# Patient Record
Sex: Male | Born: 1955 | Race: White | Hispanic: No | Marital: Married | Smoking: Current every day smoker
Health system: Southern US, Community
[De-identification: ages and names within clinical notes are randomized; demographics above are authoritative.]

## PROBLEM LIST (undated history)

## (undated) DIAGNOSIS — I509 Heart failure, unspecified: Secondary | ICD-10-CM

## (undated) DIAGNOSIS — Z972 Presence of dental prosthetic device (complete) (partial): Secondary | ICD-10-CM

## (undated) DIAGNOSIS — Z974 Presence of external hearing-aid: Secondary | ICD-10-CM

## (undated) DIAGNOSIS — I10 Essential (primary) hypertension: Secondary | ICD-10-CM

## (undated) DIAGNOSIS — D689 Coagulation defect, unspecified: Secondary | ICD-10-CM

## (undated) DIAGNOSIS — I219 Acute myocardial infarction, unspecified: Secondary | ICD-10-CM

## (undated) DIAGNOSIS — H269 Unspecified cataract: Secondary | ICD-10-CM

## (undated) DIAGNOSIS — G629 Polyneuropathy, unspecified: Secondary | ICD-10-CM

## (undated) DIAGNOSIS — S83209A Unspecified tear of unspecified meniscus, current injury, unspecified knee, initial encounter: Secondary | ICD-10-CM

## (undated) DIAGNOSIS — G4730 Sleep apnea, unspecified: Secondary | ICD-10-CM

## (undated) DIAGNOSIS — J449 Chronic obstructive pulmonary disease, unspecified: Secondary | ICD-10-CM

## (undated) DIAGNOSIS — J439 Emphysema, unspecified: Secondary | ICD-10-CM

## (undated) DIAGNOSIS — E119 Type 2 diabetes mellitus without complications: Secondary | ICD-10-CM

## (undated) DIAGNOSIS — I2699 Other pulmonary embolism without acute cor pulmonale: Secondary | ICD-10-CM

## (undated) DIAGNOSIS — E785 Hyperlipidemia, unspecified: Secondary | ICD-10-CM

## (undated) HISTORY — DX: Heart failure, unspecified: I50.9

## (undated) HISTORY — DX: Emphysema, unspecified: J43.9

## (undated) HISTORY — DX: Chronic obstructive pulmonary disease, unspecified: J44.9

## (undated) HISTORY — DX: Essential (primary) hypertension: I10

## (undated) HISTORY — DX: Coagulation defect, unspecified: D68.9

## (undated) HISTORY — PX: OTHER SURGICAL HISTORY: SHX169

## (undated) HISTORY — DX: Hyperlipidemia, unspecified: E78.5

## (undated) HISTORY — PX: SMALL INTESTINE SURGERY: SHX150

## (undated) HISTORY — PX: CARDIAC CATHETERIZATION: SHX172

## (undated) HISTORY — DX: Unspecified cataract: H26.9

## (undated) HISTORY — PX: COLON SURGERY: SHX602

## (undated) HISTORY — DX: Other pulmonary embolism without acute cor pulmonale: I26.99

---

## 2011-04-02 ENCOUNTER — Ambulatory Visit: Payer: Self-pay | Admitting: Anesthesiology

## 2011-04-08 ENCOUNTER — Ambulatory Visit: Payer: Self-pay | Admitting: Unknown Physician Specialty

## 2012-06-16 ENCOUNTER — Emergency Department: Payer: Self-pay | Admitting: Emergency Medicine

## 2013-03-15 DIAGNOSIS — I219 Acute myocardial infarction, unspecified: Secondary | ICD-10-CM

## 2013-03-15 HISTORY — DX: Acute myocardial infarction, unspecified: I21.9

## 2013-03-15 HISTORY — PX: CORONARY ANGIOPLASTY WITH STENT PLACEMENT: SHX49

## 2013-03-15 HISTORY — PX: CARDIAC CATHETERIZATION: SHX172

## 2013-08-29 DIAGNOSIS — R5383 Other fatigue: Secondary | ICD-10-CM | POA: Insufficient documentation

## 2013-08-29 DIAGNOSIS — I219 Acute myocardial infarction, unspecified: Secondary | ICD-10-CM | POA: Insufficient documentation

## 2016-11-26 LAB — PULMONARY FUNCTION TEST

## 2018-03-15 DIAGNOSIS — I2699 Other pulmonary embolism without acute cor pulmonale: Secondary | ICD-10-CM

## 2018-03-15 HISTORY — DX: Other pulmonary embolism without acute cor pulmonale: I26.99

## 2018-08-31 ENCOUNTER — Other Ambulatory Visit: Payer: Self-pay

## 2018-08-31 DIAGNOSIS — I252 Old myocardial infarction: Secondary | ICD-10-CM

## 2018-08-31 DIAGNOSIS — I10 Essential (primary) hypertension: Secondary | ICD-10-CM | POA: Diagnosis not present

## 2018-08-31 DIAGNOSIS — I2789 Other specified pulmonary heart diseases: Secondary | ICD-10-CM | POA: Diagnosis not present

## 2018-08-31 DIAGNOSIS — Z79899 Other long term (current) drug therapy: Secondary | ICD-10-CM

## 2018-08-31 DIAGNOSIS — J439 Emphysema, unspecified: Secondary | ICD-10-CM | POA: Diagnosis not present

## 2018-08-31 DIAGNOSIS — J9811 Atelectasis: Secondary | ICD-10-CM | POA: Diagnosis not present

## 2018-08-31 DIAGNOSIS — E785 Hyperlipidemia, unspecified: Secondary | ICD-10-CM | POA: Diagnosis present

## 2018-08-31 DIAGNOSIS — R0902 Hypoxemia: Secondary | ICD-10-CM | POA: Diagnosis not present

## 2018-08-31 DIAGNOSIS — R0682 Tachypnea, not elsewhere classified: Secondary | ICD-10-CM | POA: Diagnosis present

## 2018-08-31 DIAGNOSIS — Z1159 Encounter for screening for other viral diseases: Secondary | ICD-10-CM | POA: Diagnosis not present

## 2018-08-31 DIAGNOSIS — K760 Fatty (change of) liver, not elsewhere classified: Secondary | ICD-10-CM | POA: Diagnosis not present

## 2018-08-31 DIAGNOSIS — Z720 Tobacco use: Secondary | ICD-10-CM | POA: Diagnosis not present

## 2018-08-31 DIAGNOSIS — I700 Atherosclerosis of aorta: Secondary | ICD-10-CM | POA: Diagnosis present

## 2018-08-31 DIAGNOSIS — I2584 Coronary atherosclerosis due to calcified coronary lesion: Secondary | ICD-10-CM | POA: Diagnosis present

## 2018-08-31 DIAGNOSIS — I2694 Multiple subsegmental pulmonary emboli without acute cor pulmonale: Secondary | ICD-10-CM | POA: Diagnosis not present

## 2018-08-31 DIAGNOSIS — Z7982 Long term (current) use of aspirin: Secondary | ICD-10-CM | POA: Diagnosis not present

## 2018-08-31 DIAGNOSIS — I2699 Other pulmonary embolism without acute cor pulmonale: Secondary | ICD-10-CM | POA: Diagnosis not present

## 2018-08-31 DIAGNOSIS — R079 Chest pain, unspecified: Secondary | ICD-10-CM | POA: Diagnosis not present

## 2018-08-31 DIAGNOSIS — Z955 Presence of coronary angioplasty implant and graft: Secondary | ICD-10-CM

## 2018-08-31 DIAGNOSIS — I2510 Atherosclerotic heart disease of native coronary artery without angina pectoris: Secondary | ICD-10-CM | POA: Diagnosis not present

## 2018-08-31 DIAGNOSIS — F17210 Nicotine dependence, cigarettes, uncomplicated: Secondary | ICD-10-CM | POA: Diagnosis present

## 2018-08-31 DIAGNOSIS — I82431 Acute embolism and thrombosis of right popliteal vein: Secondary | ICD-10-CM | POA: Diagnosis not present

## 2018-08-31 LAB — CBC WITH DIFFERENTIAL/PLATELET
Abs Immature Granulocytes: 0.05 10*3/uL (ref 0.00–0.07)
Basophils Absolute: 0.1 10*3/uL (ref 0.0–0.1)
Basophils Relative: 1 %
Eosinophils Absolute: 0.3 10*3/uL (ref 0.0–0.5)
Eosinophils Relative: 3 %
HCT: 46.3 % (ref 39.0–52.0)
Hemoglobin: 15.2 g/dL (ref 13.0–17.0)
Immature Granulocytes: 1 %
Lymphocytes Relative: 22 %
Lymphs Abs: 2.2 10*3/uL (ref 0.7–4.0)
MCH: 30.5 pg (ref 26.0–34.0)
MCHC: 32.8 g/dL (ref 30.0–36.0)
MCV: 92.8 fL (ref 80.0–100.0)
Monocytes Absolute: 1 10*3/uL (ref 0.1–1.0)
Monocytes Relative: 10 %
Neutro Abs: 6.4 10*3/uL (ref 1.7–7.7)
Neutrophils Relative %: 63 %
Platelets: 241 10*3/uL (ref 150–400)
RBC: 4.99 MIL/uL (ref 4.22–5.81)
RDW: 12.7 % (ref 11.5–15.5)
WBC: 10 10*3/uL (ref 4.0–10.5)
nRBC: 0 % (ref 0.0–0.2)

## 2018-08-31 LAB — COMPREHENSIVE METABOLIC PANEL
ALT: 25 U/L (ref 0–44)
AST: 18 U/L (ref 15–41)
Albumin: 4.1 g/dL (ref 3.5–5.0)
Alkaline Phosphatase: 76 U/L (ref 38–126)
Anion gap: 8 (ref 5–15)
BUN: 23 mg/dL (ref 8–23)
CO2: 22 mmol/L (ref 22–32)
Calcium: 9 mg/dL (ref 8.9–10.3)
Chloride: 108 mmol/L (ref 98–111)
Creatinine, Ser: 0.98 mg/dL (ref 0.61–1.24)
GFR calc Af Amer: >60 mL/min (ref >60)
GFR calc non Af Amer: >60 mL/min (ref >60)
Glucose, Bld: 119 mg/dL — ABNORMAL HIGH (ref 70–99)
Potassium: 4.2 mmol/L (ref 3.5–5.1)
Sodium: 138 mmol/L (ref 135–145)
Total Bilirubin: 0.4 mg/dL (ref 0.3–1.2)
Total Protein: 7.8 g/dL (ref 6.5–8.1)

## 2018-08-31 LAB — TROPONIN I: Troponin I: <0.03 ng/mL (ref <0.03)

## 2018-08-31 NOTE — ED Triage Notes (Signed)
Patient c/o left chest pain. Patient denies radiation and accompanying symptoms. Patient reports hx of MI 4 years ago with stent placement.

## 2018-09-01 ENCOUNTER — Inpatient Hospital Stay
Admission: EM | Admit: 2018-09-01 | Discharge: 2018-09-02 | DRG: 176 | Disposition: A | Discharge status: Home / Self Care | Payer: Medicare Other | Attending: Internal Medicine | Admitting: Internal Medicine

## 2018-09-01 ENCOUNTER — Emergency Department: Payer: Medicare Other

## 2018-09-01 ENCOUNTER — Inpatient Hospital Stay: Payer: Medicare Other

## 2018-09-01 ENCOUNTER — Inpatient Hospital Stay
Admit: 2018-09-01 | Discharge: 2018-09-01 | Disposition: A | Discharge status: Home / Self Care | Payer: Medicare Other | Attending: Family Medicine | Admitting: Family Medicine

## 2018-09-01 DIAGNOSIS — Z79899 Other long term (current) drug therapy: Secondary | ICD-10-CM | POA: Diagnosis not present

## 2018-09-01 DIAGNOSIS — I2699 Other pulmonary embolism without acute cor pulmonale: Secondary | ICD-10-CM

## 2018-09-01 DIAGNOSIS — R0682 Tachypnea, not elsewhere classified: Secondary | ICD-10-CM | POA: Diagnosis present

## 2018-09-01 DIAGNOSIS — J9811 Atelectasis: Secondary | ICD-10-CM | POA: Diagnosis present

## 2018-09-01 DIAGNOSIS — Z86711 Personal history of pulmonary embolism: Secondary | ICD-10-CM | POA: Diagnosis present

## 2018-09-01 DIAGNOSIS — I700 Atherosclerosis of aorta: Secondary | ICD-10-CM | POA: Diagnosis present

## 2018-09-01 DIAGNOSIS — Z955 Presence of coronary angioplasty implant and graft: Secondary | ICD-10-CM | POA: Diagnosis not present

## 2018-09-01 DIAGNOSIS — K760 Fatty (change of) liver, not elsewhere classified: Secondary | ICD-10-CM | POA: Diagnosis present

## 2018-09-01 DIAGNOSIS — I2510 Atherosclerotic heart disease of native coronary artery without angina pectoris: Secondary | ICD-10-CM | POA: Diagnosis present

## 2018-09-01 DIAGNOSIS — F17210 Nicotine dependence, cigarettes, uncomplicated: Secondary | ICD-10-CM | POA: Diagnosis present

## 2018-09-01 DIAGNOSIS — I2694 Multiple subsegmental pulmonary emboli without acute cor pulmonale: Secondary | ICD-10-CM | POA: Diagnosis present

## 2018-09-01 DIAGNOSIS — Z1159 Encounter for screening for other viral diseases: Secondary | ICD-10-CM | POA: Diagnosis not present

## 2018-09-01 DIAGNOSIS — E785 Hyperlipidemia, unspecified: Secondary | ICD-10-CM | POA: Diagnosis present

## 2018-09-01 DIAGNOSIS — R0902 Hypoxemia: Secondary | ICD-10-CM | POA: Diagnosis present

## 2018-09-01 DIAGNOSIS — I252 Old myocardial infarction: Secondary | ICD-10-CM | POA: Diagnosis not present

## 2018-09-01 DIAGNOSIS — Z7982 Long term (current) use of aspirin: Secondary | ICD-10-CM | POA: Diagnosis not present

## 2018-09-01 DIAGNOSIS — I2584 Coronary atherosclerosis due to calcified coronary lesion: Secondary | ICD-10-CM | POA: Diagnosis present

## 2018-09-01 DIAGNOSIS — J439 Emphysema, unspecified: Secondary | ICD-10-CM | POA: Diagnosis present

## 2018-09-01 DIAGNOSIS — I10 Essential (primary) hypertension: Secondary | ICD-10-CM | POA: Diagnosis present

## 2018-09-01 HISTORY — DX: Acute myocardial infarction, unspecified: I21.9

## 2018-09-01 LAB — SARS CORONAVIRUS 2 BY RT PCR (HOSPITAL ORDER, PERFORMED IN ~~LOC~~ HOSPITAL LAB): SARS Coronavirus 2: NEGATIVE

## 2018-09-01 LAB — FIBRIN DERIVATIVES D-DIMER (ARMC ONLY): Fibrin derivatives D-dimer (ARMC): 1639.69 ng/mL (FEU) — ABNORMAL HIGH (ref 0.00–499.00)

## 2018-09-01 LAB — ECHOCARDIOGRAM COMPLETE

## 2018-09-01 LAB — TROPONIN I: Troponin I: <0.03 ng/mL (ref <0.03)

## 2018-09-01 MED ORDER — NICOTINE 21 MG/24HR TD PT24
21.0000 mg | MEDICATED_PATCH | Freq: Every day | TRANSDERMAL | Status: DC
  Administered 2018-09-01 – 2018-09-02 (×2): 21 mg via TRANSDERMAL
  Filled 2018-09-01 (×2): qty 1

## 2018-09-01 MED ORDER — ACETAMINOPHEN 650 MG RE SUPP: 650.0000 mg | Freq: Four times a day (QID) | RECTAL | Status: DC | PRN

## 2018-09-01 MED ORDER — ONDANSETRON HCL 4 MG/2ML IJ SOLN: 4.0000 mg | Freq: Four times a day (QID) | INTRAMUSCULAR | Status: DC | PRN

## 2018-09-01 MED ORDER — ENOXAPARIN SODIUM 150 MG/ML ~~LOC~~ SOLN
1.0000 mg/kg | Freq: Once | SUBCUTANEOUS | Status: AC
  Administered 2018-09-01: 135 mg via SUBCUTANEOUS
  Filled 2018-09-01: qty 0.91

## 2018-09-01 MED ORDER — WARFARIN SODIUM 5 MG PO TABS
5.0000 mg | ORAL_TABLET | Freq: Once | ORAL | Status: DC
  Filled 2018-09-01: qty 1

## 2018-09-01 MED ORDER — WARFARIN - PHARMACIST DOSING INPATIENT
Freq: Every day | Status: DC
  Filled 2018-09-01: qty 1

## 2018-09-01 MED ORDER — SODIUM CHLORIDE 0.9 % IV SOLN
INTRAVENOUS | Status: DC
  Administered 2018-09-01 – 2018-09-02 (×3): via INTRAVENOUS

## 2018-09-01 MED ORDER — TRAZODONE HCL 50 MG PO TABS: 25.0000 mg | ORAL_TABLET | Freq: Every evening | ORAL | Status: DC | PRN

## 2018-09-01 MED ORDER — ACETAMINOPHEN 325 MG PO TABS: 650.0000 mg | ORAL_TABLET | Freq: Four times a day (QID) | ORAL | Status: DC | PRN

## 2018-09-01 MED ORDER — WARFARIN SODIUM 5 MG PO TABS: 5.0000 mg | ORAL_TABLET | Freq: Once | ORAL | Status: DC

## 2018-09-01 MED ORDER — ONDANSETRON HCL 4 MG PO TABS: 4.0000 mg | ORAL_TABLET | Freq: Four times a day (QID) | ORAL | Status: DC | PRN

## 2018-09-01 MED ORDER — APIXABAN 5 MG PO TABS: 5.0000 mg | ORAL_TABLET | Freq: Two times a day (BID) | ORAL | Status: DC

## 2018-09-01 MED ORDER — IOPAMIDOL (ISOVUE-370) INJECTION 76%
100.0000 mL | Freq: Once | INTRAVENOUS | Status: AC | PRN
  Administered 2018-09-01: 100 mL via INTRAVENOUS

## 2018-09-01 MED ORDER — APIXABAN 5 MG PO TABS
10.0000 mg | ORAL_TABLET | Freq: Two times a day (BID) | ORAL | Status: DC
  Administered 2018-09-01 – 2018-09-02 (×2): 10 mg via ORAL
  Filled 2018-09-01 (×2): qty 2

## 2018-09-01 MED ORDER — ENOXAPARIN SODIUM 150 MG/ML ~~LOC~~ SOLN
1.0000 mg/kg | Freq: Two times a day (BID) | SUBCUTANEOUS | Status: DC
  Filled 2018-09-01: qty 0.91

## 2018-09-01 MED ORDER — MAGNESIUM HYDROXIDE 400 MG/5ML PO SUSP: 30.0000 mL | Freq: Every day | ORAL | Status: DC | PRN

## 2018-09-01 NOTE — ED Notes (Signed)
Hospital bed ordered for pt.

## 2018-09-01 NOTE — ED Notes (Signed)
Pt updated on why echo needing to be done.  Willing to complete echo.

## 2018-09-01 NOTE — Progress Notes (Addendum)
[facility] NAME: Ronald Malone    MR#:  [id number]  DATE OF BIRTH:  29-Dec-1955  SUBJECTIVE:  CHIEF COMPLAINT:   Chief Complaint  Patient presents with  . Chest Pain  Patient seen and evaluated today Has decreased chest pain Has shortness of breath No vomiting of blood REVIEW OF SYSTEMS:    ROS  CONSTITUTIONAL: No documented fever. No fatigue, weakness. No weight gain, no weight loss.  EYES: No blurry or double vision.  ENT: No tinnitus. No postnasal drip. No redness of the oropharynx.  RESPIRATORY: No cough, no wheeze, no hemoptysis. mild dyspnea.  CARDIOVASCULAR: Mild chest pain. No orthopnea. No palpitations. No syncope.  GASTROINTESTINAL: No nausea, no vomiting or diarrhea. No abdominal pain. No melena or hematochezia.  GENITOURINARY: No dysuria or hematuria.  ENDOCRINE: No polyuria or nocturia. No heat or cold intolerance.  HEMATOLOGY: No anemia. No bruising. No bleeding.  INTEGUMENTARY: No rashes. No lesions.  MUSCULOSKELETAL: No arthritis. No swelling. No gout.  NEUROLOGIC: No numbness, tingling, or ataxia. No seizure-type activity.  PSYCHIATRIC: No anxiety. No insomnia. No ADD.   DRUG ALLERGIES:  No Known Allergies  VITALS:  Blood pressure (!) 152/79, pulse 72, temperature 98.2 F (36.8 C), temperature source Oral, resp. rate (!) 22, height 6' (1.829 m), weight (!) 142.8 kg, SpO2 95 %.  PHYSICAL EXAMINATION:   Physical Exam  GENERAL:  63 y.o.-year-old patient lying in the bed with no acute distress.  EYES: Pupils equal, round, reactive to light and accommodation. No scleral icterus. Extraocular muscles intact.  HEENT: Head atraumatic, normocephalic. Oropharynx and nasopharynx clear.  NECK:  Supple, no jugular venous distention. No thyroid enlargement, no tenderness.  LUNGS: Normal breath sounds bilaterally, no wheezing, rales, rhonchi. No use of accessory muscles of respiration.  CARDIOVASCULAR: S1, S2  normal. No murmurs, rubs, or gallops.  ABDOMEN: Soft, nontender, nondistended. Bowel sounds present. No organomegaly or mass.  EXTREMITIES: No cyanosis, clubbing or edema b/l.    NEUROLOGIC: Cranial nerves II through XII are intact. No focal Motor or sensory deficits b/l.   PSYCHIATRIC: The patient is alert and oriented x 3.  SKIN: No obvious rash, lesion, or ulcer.   LABORATORY PANEL:   CBC Recent Labs  Lab 08/31/18 2233  WBC 10.0  HGB 15.2  HCT 46.3  PLT 241   ------------------------------------------------------------------------------------------------------------------ Chemistries  Recent Labs  Lab 08/31/18 2233  NA 138  K 4.2  CL 108  CO2 22  GLUCOSE 119*  BUN 23  CREATININE 0.98  CALCIUM 9.0  AST 18  ALT 25  ALKPHOS 76  BILITOT 0.4   ------------------------------------------------------------------------------------------------------------------  Cardiac Enzymes Recent Labs  Lab 09/01/18 0253  TROPONINI <0.03   ------------------------------------------------------------------------------------------------------------------  RADIOLOGY:  Ct Angio Chest Pe W And/or Wo Contrast  Result Date: 09/01/2018 CLINICAL DATA:  Chest pain, complex. Personal history of myocardial infarction. EXAM: CT ANGIOGRAPHY CHEST WITH CONTRAST TECHNIQUE: Multidetector CT imaging of the chest was performed using the standard protocol during bolus administration of intravenous contrast. Multiplanar CT image reconstructions and MIPs were obtained to evaluate the vascular anatomy. CONTRAST:  139mL ISOVUE-370 IOPAMIDOL (ISOVUE-370) INJECTION 76% COMPARISON:  None. FINDINGS: Cardiovascular: Heart size is normal. Extensive atherosclerotic changes are noted within the coronary arteries including the left main stem artery. Calcifications are present at the aortic valve and in the arch. There is no aneurysm. No significant stenosis is present at the great vessel origins. A 4 vessel arch  configuration is present, a normal variant  Pulmonary artery opacification is excellent. There is thrombus at the bifurcation of the right lower and middle lobar arteries this is nonocclusive. Nonocclusive embolus is present in the left upper lobe. No other definite emboli are present. Mediastinum/Nodes: Subcentimeter prevascular and paratracheal lymph nodes are present. Thoracic inlet is normal. Esophagus is normal. No significant hilar or axillary adenopathy is present. Lungs/Pleura: Dependent atelectasis is present. No significant airspace consolidation is present. There is no nodule or mass lesion. Centrilobular emphysematous changes are present. Upper Abdomen: There is diffuse fatty infiltration of the liver. Limited imaging of the upper abdomen is otherwise unremarkable. Musculoskeletal: Mild endplate changes are present throughout the thoracic spine. Fused anterior osteophytes are noted inferiorly. Sternum is normal. Review of the MIP images confirms the above findings. IMPRESSION: 1. Nonocclusive pulmonary embolus at the bifurcation of the right middle and lower lobar pulmonary arteries. 2. Additional punctate embolus within a lingular branch. 3.  Aortic Atherosclerosis (ICD10-I70.0). 4. Extensive coronary artery disease including calcifications within the left mainstem coronary artery. 5.  Emphysema (ICD10-J43.9). 6. Mild dependent atelectasis and scarring. 7. Hepatic steatosis. Electronically Signed   By: San Morelle M.D.   On: 09/01/2018 05:02   US Venous Img Lower Bilateral  Result Date: 09/01/2018 CLINICAL DATA:  Pulmonary embolus. EXAM: BILATERAL LOWER EXTREMITY VENOUS DOPPLER ULTRASOUND TECHNIQUE: Gray-scale sonography with graded compression, as well as color Doppler and duplex ultrasound were performed to evaluate the lower extremity deep venous systems from the level of the common femoral vein and including the common femoral, femoral, profunda femoral, popliteal and calf veins  including the posterior tibial, peroneal and gastrocnemius veins when visible. The superficial great saphenous vein was also interrogated. Spectral Doppler was utilized to evaluate flow at rest and with distal augmentation maneuvers in the common femoral, femoral and popliteal veins. COMPARISON:  CT 09/01/2018. FINDINGS: RIGHT LOWER EXTREMITY Common Femoral Vein: No evidence of thrombus. Normal compressibility, respiratory phasicity and response to augmentation. Saphenofemoral Junction: No evidence of thrombus. Normal compressibility and flow on color Doppler imaging. Profunda Femoral Vein: No evidence of thrombus. Normal compressibility and flow on color Doppler imaging. Femoral Vein: No evidence of thrombus. Normal compressibility, respiratory phasicity and response to augmentation. Popliteal Vein: Nonocclusive thrombus right popliteal vein. Calf Veins: No evidence of thrombus. Normal compressibility and flow on color Doppler imaging. Superficial Great Saphenous Vein: No evidence of thrombus. Normal compressibility. Other Findings:  None. LEFT LOWER EXTREMITY Common Femoral Vein: No evidence of thrombus. Normal compressibility, respiratory phasicity and response to augmentation. Saphenofemoral Junction: No evidence of thrombus. Normal compressibility and flow on color Doppler imaging. Profunda Femoral Vein: No evidence of thrombus. Normal compressibility and flow on color Doppler imaging. Femoral Vein: No evidence of thrombus. Normal compressibility, respiratory phasicity and response to augmentation. Popliteal Vein: No evidence of thrombus. Normal compressibility, respiratory phasicity and response to augmentation. Calf Veins: No evidence of thrombus. Normal compressibility and flow on color Doppler imaging. Superficial Great Saphenous Vein: No evidence of thrombus. Normal compressibility. Other Findings:  None. IMPRESSION: Nonocclusive deep venous thrombosis right popliteal vein. Electronically Signed   By:  Marcello Moores  Register   On: 09/01/2018 12:24     ASSESSMENT AND PLAN:   63 year old male patient with history of coronary disease, hyperlipidemia, tobacco abuse currently under hospitalist service  -Acute pulmonary embolism Currently on anticoagulation with full dose Lovenox subcutaneously and oral Coumadin Check echocardiogram Pharmacy consult for transition to eliquis for anticoagulation  -Hypoxia New onset Oxygen via nasal canula at [place] oxygen  -Hyperlipidemia Statin therapy  -  Coronary artery disease with history of stent Continue aspirin, statin and nitroglycerin sublingually  -Popliteal vein DVT (Non occlusive) Anticoagulation  -Tobacco abuse Tobacco cessation counseled to the patient for 6 minutes Nicotine patch offered  All the records are reviewed and case discussed with Care Management/Social Worker. Management plans discussed with the patient, family and they are in agreement.  CODE STATUS: Full code  DVT Prophylaxis: SCDs  TOTAL TIME TAKING CARE OF THIS PATIENT: 45 minutes.   POSSIBLE D/C IN 2 to 3 DAYS, DEPENDING ON CLINICAL CONDITION.  Saundra Shelling M.D on 09/01/2018 at 2:34 PM  Between 7am to 6pm - Pager - [phone]  After 6pm go to [email or web address] - password [email or web address] [facility]  [facility]  Office  [phone]  CC: Primary care physician; Birdie Sons, MD  Note: This dictation was prepared with Dragon dictation along with smaller phrase technology. Any transcriptional errors that result from this process are unintentional.

## 2018-09-01 NOTE — Progress Notes (Addendum)
[facility] [place] NAME: Ronald Malone    MR#:  [id number]  DATE OF BIRTH:  Jun 25, 1955  SUBJECTIVE:  CHIEF COMPLAINT:   Chief Complaint  Patient presents with  . Chest Pain  Patient seen and evaluated today Has decreased chest pain Has shortness of breath No vomiting of blood REVIEW OF SYSTEMS:    ROS  CONSTITUTIONAL: No documented fever. No fatigue, weakness. No weight gain, no weight loss.  EYES: No blurry or double vision.  ENT: No tinnitus. No postnasal drip. No redness of the oropharynx.  RESPIRATORY: No cough, no wheeze, no hemoptysis. mild dyspnea.  CARDIOVASCULAR: Mild chest pain. No orthopnea. No palpitations. No syncope.  GASTROINTESTINAL: No nausea, no vomiting or diarrhea. No abdominal pain. No melena or hematochezia.  GENITOURINARY: No dysuria or hematuria.  ENDOCRINE: No polyuria or nocturia. No heat or cold intolerance.  HEMATOLOGY: No anemia. No bruising. No bleeding.  INTEGUMENTARY: No rashes. No lesions.  MUSCULOSKELETAL: No arthritis. No swelling. No gout.  NEUROLOGIC: No numbness, tingling, or ataxia. No seizure-type activity.  PSYCHIATRIC: No anxiety. No insomnia. No ADD.   DRUG ALLERGIES:  No Known Allergies  VITALS:  Blood pressure 140/79, pulse 81, temperature 98.8 F (37.1 C), resp. rate (!) 24, height 6' (1.829 m), weight 136.1 kg, SpO2 97 %.  PHYSICAL EXAMINATION:   Physical Exam  GENERAL:  63 y.o.-year-old patient lying in the bed with no acute distress.  EYES: Pupils equal, round, reactive to light and accommodation. No scleral icterus. Extraocular muscles intact.  HEENT: Head atraumatic, normocephalic. Oropharynx and nasopharynx clear.  NECK:  Supple, no jugular venous distention. No thyroid enlargement, no tenderness.  LUNGS: Normal breath sounds bilaterally, no wheezing, rales, rhonchi. No use of accessory muscles of respiration.  CARDIOVASCULAR: S1, S2 normal. No murmurs, rubs, or gallops.   ABDOMEN: Soft, nontender, nondistended. Bowel sounds present. No organomegaly or mass.  EXTREMITIES: No cyanosis, clubbing or edema b/l.    NEUROLOGIC: Cranial nerves II through XII are intact. No focal Motor or sensory deficits b/l.   PSYCHIATRIC: The patient is alert and oriented x 3.  SKIN: No obvious rash, lesion, or ulcer.   LABORATORY PANEL:   CBC Recent Labs  Lab 08/31/18 2233  WBC 10.0  HGB 15.2  HCT 46.3  PLT 241   ------------------------------------------------------------------------------------------------------------------ Chemistries  Recent Labs  Lab 08/31/18 2233  NA 138  K 4.2  CL 108  CO2 22  GLUCOSE 119*  BUN 23  CREATININE 0.98  CALCIUM 9.0  AST 18  ALT 25  ALKPHOS 76  BILITOT 0.4   ------------------------------------------------------------------------------------------------------------------  Cardiac Enzymes Recent Labs  Lab 09/01/18 0253  TROPONINI <0.03   ------------------------------------------------------------------------------------------------------------------  RADIOLOGY:  Ct Angio Chest Pe W And/or Wo Contrast  Result Date: 09/01/2018 CLINICAL DATA:  Chest pain, complex. Personal history of myocardial infarction. EXAM: CT ANGIOGRAPHY CHEST WITH CONTRAST TECHNIQUE: Multidetector CT imaging of the chest was performed using the standard protocol during bolus administration of intravenous contrast. Multiplanar CT image reconstructions and MIPs were obtained to evaluate the vascular anatomy. CONTRAST:  165mL ISOVUE-370 IOPAMIDOL (ISOVUE-370) INJECTION 76% COMPARISON:  None. FINDINGS: Cardiovascular: Heart size is normal. Extensive atherosclerotic changes are noted within the coronary arteries including the left main stem artery. Calcifications are present at the aortic valve and in the arch. There is no aneurysm. No significant stenosis is present at the great vessel origins. A 4 vessel arch configuration is present, a normal variant  Pulmonary artery opacification is excellent.  There is thrombus at the bifurcation of the right lower and middle lobar arteries this is nonocclusive. Nonocclusive embolus is present in the left upper lobe. No other definite emboli are present. Mediastinum/Nodes: Subcentimeter prevascular and paratracheal lymph nodes are present. Thoracic inlet is normal. Esophagus is normal. No significant hilar or axillary adenopathy is present. Lungs/Pleura: Dependent atelectasis is present. No significant airspace consolidation is present. There is no nodule or mass lesion. Centrilobular emphysematous changes are present. Upper Abdomen: There is diffuse fatty infiltration of the liver. Limited imaging of the upper abdomen is otherwise unremarkable. Musculoskeletal: Mild endplate changes are present throughout the thoracic spine. Fused anterior osteophytes are noted inferiorly. Sternum is normal. Review of the MIP images confirms the above findings. IMPRESSION: 1. Nonocclusive pulmonary embolus at the bifurcation of the right middle and lower lobar pulmonary arteries. 2. Additional punctate embolus within a lingular branch. 3.  Aortic Atherosclerosis (ICD10-I70.0). 4. Extensive coronary artery disease including calcifications within the left mainstem coronary artery. 5.  Emphysema (ICD10-J43.9). 6. Mild dependent atelectasis and scarring. 7. Hepatic steatosis. Electronically Signed   By: San Morelle M.D.   On: 09/01/2018 05:02     ASSESSMENT AND PLAN:   63 year old male patient with history of coronary disease, hyperlipidemia, tobacco abuse currently under hospitalist service  -Acute pulmonary embolism Continue anticoagulation with full dose Lovenox subcutaneously and oral Coumadin Check echocardiogram Follow-up Doppler ultrasound of lower extremities Follow-up INR Pharmacy consult for Coumadin dosing  -Hypoxia New onset Oxygen via nasal canula at 4liter  -Hyperlipidemia Statin therapy  -Coronary  artery disease with history of stent Continue aspirin, statin and nitroglycerin sublingually  -Tobacco abuse Tobacco cessation counseled to the patient for 6 minutes Nicotine patch offered  All the records are reviewed and case discussed with Care Management/Social Worker. Management plans discussed with the patient, family and they are in agreement.  CODE STATUS: Full code  DVT Prophylaxis: SCDs  TOTAL TIME TAKING CARE OF THIS PATIENT: 38 minutes.   POSSIBLE D/C IN 2 to 3 DAYS, DEPENDING ON CLINICAL CONDITION.  Saundra Shelling M.D on 09/01/2018 at 10:41 AM  Between 7am to 6pm - Pager - [phone]  After 6pm go to [email or web address] - password [email or web address] [facility]  Office  [phone]  CC: Primary care physician; No primary care provider on file.  Note: This dictation was prepared with Dragon dictation along with smaller phrase technology. Any transcriptional errors that result from this process are unintentional.

## 2018-09-01 NOTE — Consult Note (Signed)
ANTICOAGULATION CONSULT NOTE - Initial Consult  Pharmacy Consult for Apixaban Indication: pulmonary embolus  No Known Allergies  Patient Measurements: Height: 6' (182.9 cm) Weight: (!) 314 lb 12.8 oz (142.8 kg) IBW/kg (Calculated) : 77.6  Vital Signs: Temp: 98.2 F (36.8 C) (06/19 1339) Temp Source: Oral (06/19 1339) BP: 152/79 (06/19 1339) Pulse Rate: 72 (06/19 1402)  Labs: Recent Labs    08/31/18 2233 09/01/18 0253  HGB 15.2  --   HCT 46.3  --   PLT 241  --   CREATININE 0.98  --   TROPONINI <0.03 <0.03    Estimated Creatinine Clearance: 110.2 mL/min (by C-G formula based on SCr of 0.98 mg/dL).   Medical History: Past Medical History:  Diagnosis Date  . MI (myocardial infarction) ([place])     Medications:  No medications prior to admission.   Scheduled:  . nicotine  21 mg Transdermal Daily  . Warfarin - Pharmacist Dosing Inpatient   Does not apply q1800   Infusions:  . sodium chloride 100 mL/hr at 09/01/18 1107   PRN: acetaminophen **OR** acetaminophen, magnesium hydroxide, ondansetron **OR** ondansetron (ZOFRAN) IV, traZODone Anti-infectives (From admission, onward)   None      Assessment: Pharmacy consulted to transition warfarin + enoxaparin for PE to apixaban. DOACs in patient with BMI > 40 is not recommended per ISTH recommendations. Pt received last dose of enoxaparin 6/19 @0536 .   Goal of Therapy:  Monitor platelets by anticoagulation protocol: Yes   Plan:  Will start apixaban 10 mg BID x 7 days followed by 5 mg BID thereafter. Will d/c enoxaparin and warfarin. Continue to monitor CBC.   Oswald Hillock, PharmD, BCPS  09/01/2018,2:43 PM

## 2018-09-01 NOTE — ED Notes (Signed)
Password: [email or web address] For family so that information csasn be shared.

## 2018-09-01 NOTE — Progress Notes (Signed)
Advanced care plan. Purpose of the Encounter: CODE STATUS Parties in Attendance: Patient Patient's Decision Capacity: Good Subjective/Patient's story: Ronald Malone  is a 63 y.o. Caucasian male with a known history of coronary artery disease status post MI, status post PCI and stent as well as ongoing tobacco abuse, presented to the emergency room with onset of left-sided upper chest wall pain increasing with deep breathing which has been intermittent over the last few nights with associated cough without dyspnea or wheezing.  He continues to smoke 2 packs of cigarettes per day.  He denied any fever or chills.  He has been having bilateral lower extremity edema with no pain.  He denied any recent travels or surgeries.  He stated that his feet would feel numb sometimes.  No bleeding diathesis Upon presentation to the emergency room, vital signs were within normal and later patient had elevated blood pressure with mild tachypnea.  D-dimer was significantly elevated at 1639.69.  His COVID-19 test came back negative and his EKG showed normal sinus rhythm with a rate of 69.  Chest CT angiography revealed nonocclusive pulmonary embolus at the bifurcation of the right middle and lower lobar pulmonary arteries as well as punctate embolus within the lingular branch, aortic atherosclerosis, extensive coronary artery disease including calcification within the left mainstem coronary artery, emphysema and mild dependent atelectasis and scarring with hepatic steatosis. The patient was given therapeutic dose of Lovenox.  He will be admitted to a telemetry bed for further evaluation and management. Objective/Medical story Patient will need work-up with lower extremity Doppler ultrasound to rule out DVT.  Patient will also need work-up with echocardiogram.  Needs anticoagulation with Lovenox subcutaneously twice daily along with Coumadin and follow-up INR. Goals of care determination:  Advance care directives goals of  care treatment plan discussed Patient wants everything done which includes CPR, intubation ventilator if the need arises CODE STATUS: Full code Time spent discussing advanced care planning: 16 minutes

## 2018-09-01 NOTE — ED Provider Notes (Signed)
[facility] Emergency Department Provider Note    First MD Initiated Contact with Patient 09/01/18 0532     (approximate)  I have reviewed the triage vital signs and the nursing notes.   HISTORY  Chief Complaint Chest Pain    HPI Ronald Malone is a 63 y.o. male with history of myocardial infarction 5 years ago presents to the emergency department with 1 day history of left-sided chest pain that is nonradiating.  Patient states that pain is worse with deep inspiration.  Patient denies any nausea or vomiting.  Patient denies any diaphoresis.  Patient denies any lower extremity pain or swelling.       Past Medical History:  Diagnosis Date   MI (myocardial infarction) [facility])       Past Surgical History:  Procedure Laterality Date   CARDIAC CATHETERIZATION      Prior to Admission medications   Not on File    Allergies Patient has no known allergies.  No family history on file.  Social History Social History   Tobacco Use   Smoking status: Current Every Day Smoker   Smokeless tobacco: Never Used  Substance Use Topics   Alcohol use: Yes   Drug use: Not on file    Review of Systems Constitutional: No fever/chills Eyes: No visual changes. ENT: No sore throat. Cardiovascular: Positive for chest pain. Respiratory: Denies shortness of breath. Gastrointestinal: No abdominal pain.  No nausea, no vomiting.  No diarrhea.  No constipation. Genitourinary: Negative for dysuria. Musculoskeletal: Negative for neck pain.  Negative for back pain. Integumentary: Negative for rash. Neurological: Negative for headaches, focal weakness or numbness.   ____________________________________________   PHYSICAL EXAM:  VITAL SIGNS: ED Triage Vitals  Enc Vitals Group     BP 08/31/18 2229 133/75     Pulse Rate 08/31/18 2229 81     Resp 08/31/18 2229 20     Temp 08/31/18 2229 98.8 F (37.1 C)     Temp src --      SpO2 08/31/18 2229 95 %     Weight 08/31/18 2226 136.1 kg (300 lb)     Height 08/31/18 2226 1.829 m (6')     Head Circumference --      Peak Flow --      Pain Score 08/31/18 2226 7     Pain Loc --      Pain Edu? --      Excl. in [place]? --     Constitutional: Alert and oriented. Well appearing and in no acute distress. Eyes: Conjunctivae are normal. Mouth/Throat: Mucous membranes are moist. Oropharynx non-erythematous. Neck: No stridor.   Cardiovascular: Normal rate, regular rhythm. Good peripheral circulation. Grossly normal heart sounds. Respiratory: Normal respiratory effort.  No retractions. No audible wheezing. Gastrointestinal: Soft and nontender. No distention.  Musculoskeletal: No lower extremity tenderness nor edema. No gross deformities of extremities. Neurologic:  Normal speech and language. No gross focal neurologic deficits are appreciated.  Skin:  Skin is warm, dry and intact. No rash noted. Psychiatric: Mood and affect are normal. Speech and behavior are normal.  ____________________________________________   LABS (all labs ordered are listed, but only abnormal results are displayed)  Labs Reviewed  COMPREHENSIVE METABOLIC PANEL - Abnormal; Notable for the following components:      Result Value   Glucose, Bld 119 (*)    All other components within normal limits  FIBRIN DERIVATIVES D-DIMER ([facility] ONLY) - Abnormal; Notable for the following components:   Fibrin derivatives  D-dimer [facility]) 6,010.93 (*)    All other components within normal limits  SARS CORONAVIRUS 2 (HOSPITAL ORDER, [place] [facility])  CBC WITH DIFFERENTIAL/PLATELET  TROPONIN I  TROPONIN I   ____________________________________________  EKG  ED ECG REPORT I, Sauk N Ramir Malerba, the attending physician, personally viewed and interpreted this ECG.   Date: 08/31/2018  EKG Time: 10:29 PM  Rate: 80  Rhythm: Normal sinus rhythm.  S1 Q3 T3 noted  Axis: Normal  Intervals: Normal  ST&T Change:  None  ____________________________________________  RADIOLOGY I, Henrietta N Uliana Brinker, personally viewed and evaluated these images (plain radiographs) as part of my medical decision making, as well as reviewing the written report by the radiologist.  ED MD interpretation: Nonocclusive pulmonary emboli at the bifurcation of the right middle and lower lobe pulmonary artery.  Additional punctate embolus in the lingular branch per radiologist  Official radiology report(s): Ct Angio Chest Pe W And/or Wo Contrast  Result Date: 09/01/2018 CLINICAL DATA:  Chest pain, complex. Personal history of myocardial infarction. EXAM: CT ANGIOGRAPHY CHEST WITH CONTRAST TECHNIQUE: Multidetector CT imaging of the chest was performed using the standard protocol during bolus administration of intravenous contrast. Multiplanar CT image reconstructions and MIPs were obtained to evaluate the vascular anatomy. CONTRAST:  181mL ISOVUE-370 IOPAMIDOL (ISOVUE-370) INJECTION 76% COMPARISON:  None. FINDINGS: Cardiovascular: Heart size is normal. Extensive atherosclerotic changes are noted within the coronary arteries including the left main stem artery. Calcifications are present at the aortic valve and in the arch. There is no aneurysm. No significant stenosis is present at the great vessel origins. A 4 vessel arch configuration is present, a normal variant Pulmonary artery opacification is excellent. There is thrombus at the bifurcation of the right lower and middle lobar arteries this is nonocclusive. Nonocclusive embolus is present in the left upper lobe. No other definite emboli are present. Mediastinum/Nodes: Subcentimeter prevascular and paratracheal lymph nodes are present. Thoracic inlet is normal. Esophagus is normal. No significant hilar or axillary adenopathy is present. Lungs/Pleura: Dependent atelectasis is present. No significant airspace consolidation is present. There is no nodule or mass lesion. Centrilobular  emphysematous changes are present. Upper Abdomen: There is diffuse fatty infiltration of the liver. Limited imaging of the upper abdomen is otherwise unremarkable. Musculoskeletal: Mild endplate changes are present throughout the thoracic spine. Fused anterior osteophytes are noted inferiorly. Sternum is normal. Review of the MIP images confirms the above findings. IMPRESSION: 1. Nonocclusive pulmonary embolus at the bifurcation of the right middle and lower lobar pulmonary arteries. 2. Additional punctate embolus within a lingular branch. 3.  Aortic Atherosclerosis (ICD10-I70.0). 4. Extensive coronary artery disease including calcifications within the left mainstem coronary artery. 5.  Emphysema (ICD10-J43.9). 6. Mild dependent atelectasis and scarring. 7. Hepatic steatosis. Electronically Signed   By: San Morelle M.D.   On: 09/01/2018 05:02     .Critical Care Performed by: Gregor Hams, MD Authorized by: Gregor Hams, MD   Critical care provider statement:    Critical care time (minutes):  30   Critical care time was exclusive of:  Separately billable procedures and treating other patients (Pulmonary emboli)   Critical care was time spent personally by me on the following activities:  Development of treatment plan with patient or surrogate, discussions with consultants, evaluation of patient's response to treatment, examination of patient, obtaining history from patient or surrogate, ordering and performing treatments and interventions, ordering and review of laboratory studies, ordering and review of radiographic studies, pulse oximetry, re-evaluation of  patient's condition and review of old charts     ____________________________________________   INITIAL IMPRESSION / MDM / [place] / ED COURSE  As part of my medical decision making, I reviewed the following data within the electronic MEDICAL RECORD NUMBER  63 year old male presenting with above-stated history and  physical exam secondary to chest pain.  Concern for possible pulmonary emboli given pain worse with deep inspiration EKG findings of S1Q3T3 and as such CT angiogram of the chest performed which confirmed the presence of a pulmonary emboli at the bifurcation of the right middle and lower lobar pulmonary artery.  In addition patient has an additional punctate emboli at the lingula.  Concern for possible CAD as well her EKG revealed no evidence of ischemia or infarction.  Laboratory data including troponin x2-.  Patient given Lovenox 1 mg/kg in the emergency department.  Patient discussed with both Dr. Jobe Igo (radiologist) and Dr. Sidney Ace hospitalist for admission for further evaluation and management     *Ronald Malone was evaluated in Emergency Department on 09/01/2018 for the symptoms described in the history of present illness. He was evaluated in the context of the global COVID-19 pandemic, which necessitated consideration that the patient might be at risk for infection with the SARS-CoV-2 virus that causes COVID-19. Institutional protocols and algorithms that pertain to the evaluation of patients at risk for COVID-19 are in a state of rapid change based on information released by regulatory bodies including the CDC and federal and state organizations. These policies and algorithms were followed during the patient's care in the ED.  Some ED evaluations and interventions may be delayed as a result of limited staffing during the pandemic.*    ____________________________________________  FINAL CLINICAL IMPRESSION(S) / ED DIAGNOSES  Final diagnoses:  Multiple subsegmental pulmonary emboli without acute cor pulmonale     MEDICATIONS GIVEN DURING THIS VISIT:  Medications  0.9 %  sodium chloride infusion (has no administration in time range)  acetaminophen (TYLENOL) tablet 650 mg (has no administration in time range)    Or  acetaminophen (TYLENOL) suppository 650 mg (has no administration in time  range)  traZODone (DESYREL) tablet 25 mg (has no administration in time range)  magnesium hydroxide (MILK OF MAGNESIA) suspension 30 mL (has no administration in time range)  ondansetron (ZOFRAN) tablet 4 mg (has no administration in time range)    Or  ondansetron (ZOFRAN) injection 4 mg (has no administration in time range)  enoxaparin (LOVENOX) injection 135 mg (has no administration in time range)  warfarin (COUMADIN) tablet 5 mg (has no administration in time range)  iopamidol (ISOVUE-370) 76 % injection 100 mL (100 mLs Intravenous Contrast Given 09/01/18 0358)  enoxaparin (LOVENOX) injection 135 mg (135 mg Subcutaneous Given 09/01/18 0536)     ED Discharge Orders    None       Note:  This document was prepared using Dragon voice recognition software and may include unintentional dictation errors.   Gregor Hams, MD 09/01/18 [phone]

## 2018-09-01 NOTE — ED Notes (Signed)
Updated pt that no longer has bed.  Given breakfast tray.

## 2018-09-01 NOTE — Progress Notes (Signed)
*  PRELIMINARY RESULTS* Echocardiogram 2D Echocardiogram has been performed.  Ronald Malone 09/01/2018, 9:48 AM

## 2018-09-01 NOTE — ED Notes (Signed)
Pt transported to 2A-255

## 2018-09-01 NOTE — H&P (Signed)
[facility] [place] NAME: Ronald Malone    MR#:  [id number]  DATE OF BIRTH:  24-Dec-1955  DATE OF ADMISSION:  09/01/2018  PRIMARY CARE PHYSICIAN: Birdie Sons, MD   REQUESTING/REFERRING PHYSICIAN: Marjean Donna, MD  CHIEF COMPLAINT:   Chief Complaint  Patient presents with   Chest Pain    HISTORY OF PRESENT ILLNESS:  Ronald Malone  is a 63 y.o. Caucasian male with a known history of coronary artery disease status post MI, status post PCI and stent as well as ongoing tobacco abuse, presented to the emergency room with onset of left-sided upper chest wall pain increasing with deep breathing which has been intermittent over the last few nights with associated cough without dyspnea or wheezing.  He continues to smoke 2 packs of cigarettes per day.  He denied any fever or chills.  He has been having bilateral lower extremity edema with no pain.  He denied any recent travels or surgeries.  He stated that his feet would feel numb sometimes.  No bleeding diathesis  Upon presentation to the emergency room, vital signs were within normal and later patient had elevated blood pressure with mild tachypnea.  D-dimer was significantly elevated at 1639.69.  His COVID-19 test came back negative and his EKG showed normal sinus rhythm with a rate of 69.  Chest CT angiography revealed nonocclusive pulmonary embolus at the bifurcation of the right middle and lower lobar pulmonary arteries as well as punctate embolus within the lingular branch, aortic atherosclerosis, extensive coronary artery disease including calcification within the left mainstem coronary artery, emphysema and mild dependent atelectasis and scarring with hepatic steatosis.  The patient was given therapeutic dose of Lovenox.  He will be admitted to a telemetry bed for further evaluation and management.  PAST MEDICAL HISTORY:   Past Medical History:  Diagnosis Date   MI (myocardial  infarction) ([place])   Ongoing tobacco abuse and emphysema  PAST SURGICAL HISTORY:   Past Surgical History:  Procedure Laterality Date   CARDIAC CATHETERIZATION    PCI and cardiac stent  SOCIAL HISTORY:   Social History   Tobacco Use   Smoking status: Current Every Day Smoker   Smokeless tobacco: Never Used  Substance Use Topics   Alcohol use: Yes    FAMILY HISTORY:  No family history on file.  DRUG ALLERGIES:  No Known Allergies  REVIEW OF SYSTEMS:   ROS As per history of present illness. All pertinent systems were reviewed above. Constitutional,  HEENT, cardiovascular, respiratory, GI, GU, musculoskeletal, neuro, psychiatric, endocrine,  integumentary and hematologic systems were reviewed and are otherwise  negative/unremarkable except for positive findings mentioned above in the HPI.   MEDICATIONS AT HOME:   Prior to Admission medications   Not on File      VITAL SIGNS:  Blood pressure (!) 170/87, pulse 67, temperature 98.8 F (37.1 C), resp. rate (!) 22, height 6' (1.829 m), weight 136.1 kg, SpO2 97 %.  PHYSICAL EXAMINATION:  Physical Exam  GENERAL:  63 y.o.-year-old obese Caucasian male patient lying in the bed with no acute distress.  EYES: Pupils equal, round, reactive to light and accommodation. No scleral icterus. Extraocular muscles intact.  HEENT: Head atraumatic, normocephalic. Oropharynx and nasopharynx clear.  NECK:  Supple, no jugular venous distention. No thyroid enlargement, no tenderness.  LUNGS: Normal breath sounds bilaterally, no wheezing, rales,rhonchi or crepitation. No use of accessory muscles of respiration.  CARDIOVASCULAR: Regular rate and rhythm, S1, S2  normal. No murmurs, rubs, or gallops.  ABDOMEN: Soft, nondistended, nontender. Bowel sounds present. No organomegaly or mass.  EXTREMITIES: Trace bilateral lower extremity edema, with no cyanosis, or clubbing.  Negative Homans sign.  His right leg seemed to be more minimally larger  than the left. NEUROLOGIC: Cranial nerves II through XII are intact. Muscle strength 5/5 in all extremities. Sensation intact. Gait not checked.  PSYCHIATRIC: The patient is alert and oriented x 3.  Normal affect and good eye contact. SKIN: No obvious rash, lesion, or ulcer.   LABORATORY PANEL:   CBC Recent Labs  Lab 08/31/18 2233  WBC 10.0  HGB 15.2  HCT 46.3  PLT 241   ------------------------------------------------------------------------------------------------------------------  Chemistries  Recent Labs  Lab 08/31/18 2233  NA 138  K 4.2  CL 108  CO2 22  GLUCOSE 119*  BUN 23  CREATININE 0.98  CALCIUM 9.0  AST 18  ALT 25  ALKPHOS 76  BILITOT 0.4   ------------------------------------------------------------------------------------------------------------------  Cardiac Enzymes Recent Labs  Lab 09/01/18 0253  TROPONINI <0.03   ------------------------------------------------------------------------------------------------------------------  RADIOLOGY:  Ct Angio Chest Pe W And/or Wo Contrast  Result Date: 09/01/2018 CLINICAL DATA:  Chest pain, complex. Personal history of myocardial infarction. EXAM: CT ANGIOGRAPHY CHEST WITH CONTRAST TECHNIQUE: Multidetector CT imaging of the chest was performed using the standard protocol during bolus administration of intravenous contrast. Multiplanar CT image reconstructions and MIPs were obtained to evaluate the vascular anatomy. CONTRAST:  157mL ISOVUE-370 IOPAMIDOL (ISOVUE-370) INJECTION 76% COMPARISON:  None. FINDINGS: Cardiovascular: Heart size is normal. Extensive atherosclerotic changes are noted within the coronary arteries including the left main stem artery. Calcifications are present at the aortic valve and in the arch. There is no aneurysm. No significant stenosis is present at the great vessel origins. A 4 vessel arch configuration is present, a normal variant Pulmonary artery opacification is excellent. There is  thrombus at the bifurcation of the right lower and middle lobar arteries this is nonocclusive. Nonocclusive embolus is present in the left upper lobe. No other definite emboli are present. Mediastinum/Nodes: Subcentimeter prevascular and paratracheal lymph nodes are present. Thoracic inlet is normal. Esophagus is normal. No significant hilar or axillary adenopathy is present. Lungs/Pleura: Dependent atelectasis is present. No significant airspace consolidation is present. There is no nodule or mass lesion. Centrilobular emphysematous changes are present. Upper Abdomen: There is diffuse fatty infiltration of the liver. Limited imaging of the upper abdomen is otherwise unremarkable. Musculoskeletal: Mild endplate changes are present throughout the thoracic spine. Fused anterior osteophytes are noted inferiorly. Sternum is normal. Review of the MIP images confirms the above findings. IMPRESSION: 1. Nonocclusive pulmonary embolus at the bifurcation of the right middle and lower lobar pulmonary arteries. 2. Additional punctate embolus within a lingular branch. 3.  Aortic Atherosclerosis (ICD10-I70.0). 4. Extensive coronary artery disease including calcifications within the left mainstem coronary artery. 5.  Emphysema (ICD10-J43.9). 6. Mild dependent atelectasis and scarring. 7. Hepatic steatosis. Electronically Signed   By: San Morelle M.D.   On: 09/01/2018 05:02      IMPRESSION AND PLAN:   1.  Acute pulmonary embolism.  The patient will be admitted to a telemetry bed.  We will continue him on therapeutic Lovenox and start p.o. Coumadin for now.  Will follow daily INR.  Will obtain a 2D echo to evaluate for right ventricular strain as well as bilateral lower extremity venous duplex to look for a source.  2.  Ongoing tobacco abuse.  I counseled him for smoking cessation and he will  receive further counseling here.  He has emphysema on his chest CT however has not used bronchodilators regular basis.  3.   Coronary artery disease status post MI, status post PCI and stent.  We will continue his aspirin and statin therapy as well as as needed sublingual nitroglycerin.  4.  Dyslipidemia.  Statin therapy will be resumed.  5.  DVT prophylaxis will be provided with therapeutic subcutaneous Lovenox.    All the records are reviewed and case discussed with ED provider. The plan of care was discussed in details with the patient (and family). I answered all questions. The patient agreed to proceed with the above mentioned plan. Further management will depend upon hospital course.   CODE STATUS: Full code  TOTAL TIME TAKING CARE OF THIS PATIENT: 50 minutes.    Christel Mormon M.D on 09/01/2018 at 6:35 AM  Pager - [phone]  After 6pm go to [email or web address] - Proofreader  [facility]  Office  [phone]  CC: Primary care physician; Birdie Sons, MD   Note: This dictation was prepared with Dragon dictation along with smaller phrase technology. Any transcriptional errors that result from this process are unintentional.

## 2018-09-01 NOTE — ED Notes (Signed)
Dr pyreddy notified of O2 changes and increasing requirement.

## 2018-09-01 NOTE — ED Notes (Signed)
Pt given orange juice. NAD. Unlabored. VSS. Turned tv on. No other needs. Waiting on admit bed.

## 2018-09-01 NOTE — ED Notes (Signed)
Pt given meal tray.

## 2018-09-01 NOTE — ED Notes (Signed)
ED TO INPATIENT HANDOFF REPORT  ED Nurse Name and Phone #: Blakeleigh Domek [phone]  S Name/Age/Gender Ronald Malone 63 y.o. male Room/Bed: ED24A/ED24A  Code Status   Code Status: Full Code  Home/SNF/Other Home Patient oriented to: self, place, time and situation Is this baseline? Yes   Triage Complete: Triage complete  Chief Complaint Chest pain  Triage Note Patient c/o left chest pain. Patient denies radiation and accompanying symptoms. Patient reports hx of MI 4 years ago with stent placement.    Allergies No Known Allergies  Level of Care/Admitting Diagnosis ED Disposition    ED Disposition Condition [facility] Area: [place] [[id number]]  Level of Care: Telemetry [5]  Covid Evaluation: Confirmed COVID Negative  Diagnosis: Acute pulmonary embolism [facility]) [[id number]]  Admitting Physician: Christel Mormon [[id number]]  Attending Physician: Christel Mormon [[id number]]  Estimated length of stay: past midnight tomorrow  Certification:: I certify this patient will need inpatient services for at least 2 midnights  PT Class (Do Not Modify): Inpatient [101]  PT Acc Code (Do Not Modify): Private [1]       B Medical/Surgery History Past Medical History:  Diagnosis Date  . MI (myocardial infarction) [facility])    Past Surgical History:  Procedure Laterality Date  . CARDIAC CATHETERIZATION       A IV Location/Drains/Wounds Patient Lines/Drains/Airways Status   Active Line/Drains/Airways    Name:   Placement date:   Placement time:   Site:   Days:   Peripheral IV 09/01/18 Left Antecubital   09/01/18    0250    Antecubital   less than 1          Intake/Output Last 24 hours No intake or output data in the 24 hours ending 09/01/18 0751  Labs/Imaging Results for orders placed or performed during the hospital encounter of 09/01/18 (from the past 48 hour(s))  CBC with Differential     Status: None   Collection Time: 08/31/18 10:33 PM  Result Value Ref  Range   WBC 10.0 4.0 - 10.5 K/uL   RBC 4.99 4.22 - 5.81 MIL/uL   Hemoglobin 15.2 13.0 - 17.0 g/dL   HCT 46.3 39.0 - 52.0 %   MCV 92.8 80.0 - 100.0 fL   MCH 30.5 26.0 - 34.0 pg   MCHC 32.8 30.0 - 36.0 g/dL   RDW 12.7 11.5 - 15.5 %   Platelets 241 150 - 400 K/uL   nRBC 0.0 0.0 - 0.2 %   Neutrophils Relative % 63 %   Neutro Abs 6.4 1.7 - 7.7 K/uL   Lymphocytes Relative 22 %   Lymphs Abs 2.2 0.7 - 4.0 K/uL   Monocytes Relative 10 %   Monocytes Absolute 1.0 0.1 - 1.0 K/uL   Eosinophils Relative 3 %   Eosinophils Absolute 0.3 0.0 - 0.5 K/uL   Basophils Relative 1 %   Basophils Absolute 0.1 0.0 - 0.1 K/uL   Immature Granulocytes 1 %   Abs Immature Granulocytes 0.05 0.00 - 0.07 K/uL    Comment: Performed at [facility], [address] [place], [place] [address]  Comprehensive metabolic panel     Status: Abnormal   Collection Time: 08/31/18 10:33 PM  Result Value Ref Range   Sodium 138 135 - 145 mmol/L   Potassium 4.2 3.5 - 5.1 mmol/L   Chloride 108 98 - 111 mmol/L   CO2 22 22 - 32 mmol/L   Glucose, Bld 119 (H) 70 - 99  mg/dL   BUN 23 8 - 23 mg/dL   Creatinine, Ser 0.98 0.61 - 1.24 mg/dL   Calcium 9.0 8.9 - 10.3 mg/dL   Total Protein 7.8 6.5 - 8.1 g/dL   Albumin 4.1 3.5 - 5.0 g/dL   AST 18 15 - 41 U/L   ALT 25 0 - 44 U/L   Alkaline Phosphatase 76 38 - 126 U/L   Total Bilirubin 0.4 0.3 - 1.2 mg/dL   GFR calc non Af Amer >60 >60 mL/min   GFR calc Af Amer >60 >60 mL/min   Anion gap 8 5 - 15    Comment: Performed at [facility], [address] [place], [place] [address]  Troponin I - ONCE - STAT     Status: None   Collection Time: 08/31/18 10:33 PM  Result Value Ref Range   Troponin I <0.03 <0.03 ng/mL    Comment: Performed at [facility], [address] [place], [place] [address]  Fibrin derivatives D-Dimer [facility] only)     Status: Abnormal   Collection Time: 09/01/18  2:53 AM  Result Value Ref Range   Fibrin derivatives D-dimer ([facility])  1,639.69 (H) 0.00 - 499.00 ng/mL (FEU)    Comment: (NOTE) <> Exclusion of Venous Thromboembolism (VTE) - OUTPATIENT ONLY   (Emergency Department or [place])   0-499 ng/ml (FEU): With a low to intermediate pretest probability                      for VTE this test result excludes the diagnosis                      of VTE.   >499 ng/ml (FEU) : VTE not excluded; additional work up for VTE is                      required. <> Testing on Inpatients and Evaluation of Disseminated Intravascular   Coagulation (DIC) Reference Range:   0-499 ng/ml (FEU) Performed at [facility], [address] [place], [place] [address]   Troponin I - ONCE - STAT     Status: None   Collection Time: 09/01/18  2:53 AM  Result Value Ref Range   Troponin I <0.03 <0.03 ng/mL    Comment: Performed at [facility], [address] [place], [place] [address]  SARS Coronavirus 2 (CEPHEID- Performed in [place] hospital lab), Hosp Order     Status: None   Collection Time: 09/01/18  3:07 AM   Specimen: Nasopharyngeal Swab  Result Value Ref Range   SARS Coronavirus 2 NEGATIVE NEGATIVE    Comment: (NOTE) If result is NEGATIVE SARS-CoV-2 target nucleic acids are NOT DETECTED. The SARS-CoV-2 RNA is generally detectable in upper and lower  respiratory specimens during the acute phase of infection. The lowest  concentration of SARS-CoV-2 viral copies this assay can detect is 250  copies / mL. A negative result does not preclude SARS-CoV-2 infection  and should not be used as the sole basis for treatment or other  patient management decisions.  A negative result may occur with  improper specimen collection / handling, submission of specimen other  than nasopharyngeal swab, presence of viral mutation(s) within the  areas targeted by this assay, and inadequate number of viral copies  (<250 copies / mL). A negative result must be combined with clinical  observations, patient history, and epidemiological  information. If result is POSITIVE SARS-CoV-2 target nucleic  acids are DETECTED. The SARS-CoV-2 RNA is generally detectable in upper and lower  respiratory specimens dur ing the acute phase of infection.  Positive  results are indicative of active infection with SARS-CoV-2.  Clinical  correlation with patient history and other diagnostic information is  necessary to determine patient infection status.  Positive results do  not rule out bacterial infection or co-infection with other viruses. If result is PRESUMPTIVE POSTIVE SARS-CoV-2 nucleic acids MAY BE PRESENT.   A presumptive positive result was obtained on the submitted specimen  and confirmed on repeat testing.  While 2019 novel coronavirus  (SARS-CoV-2) nucleic acids may be present in the submitted sample  additional confirmatory testing may be necessary for epidemiological  and / or clinical management purposes  to differentiate between  SARS-CoV-2 and other Sarbecovirus currently known to infect humans.  If clinically indicated additional testing with an alternate test  methodology [phone]) is advised. The SARS-CoV-2 RNA is generally  detectable in upper and lower respiratory sp ecimens during the acute  phase of infection. The expected result is Negative. Fact Sheet for Patients:  StrictlyIdeas.no Fact Sheet for Healthcare Providers: BankingDealers.co.za This test is not yet approved or cleared by the [place] FDA and has been authorized for detection and/or diagnosis of SARS-CoV-2 by FDA under an Emergency Use Authorization (EUA).  This EUA will remain in effect (meaning this test can be used) for the duration of the COVID-19 declaration under Section 564(b)(1) of the Act, 21 U.S.C. section 360bbb-3(b)(1), unless the authorization is terminated or revoked sooner. Performed at [facility], [address] [place], [place] [address]    Ct Angio Chest Pe W  And/or Wo Contrast  Result Date: 09/01/2018 CLINICAL DATA:  Chest pain, complex. Personal history of myocardial infarction. EXAM: CT ANGIOGRAPHY CHEST WITH CONTRAST TECHNIQUE: Multidetector CT imaging of the chest was performed using the standard protocol during bolus administration of intravenous contrast. Multiplanar CT image reconstructions and MIPs were obtained to evaluate the vascular anatomy. CONTRAST:  158mL ISOVUE-370 IOPAMIDOL (ISOVUE-370) INJECTION 76% COMPARISON:  None. FINDINGS: Cardiovascular: Heart size is normal. Extensive atherosclerotic changes are noted within the coronary arteries including the left main stem artery. Calcifications are present at the aortic valve and in the arch. There is no aneurysm. No significant stenosis is present at the great vessel origins. A 4 vessel arch configuration is present, a normal variant Pulmonary artery opacification is excellent. There is thrombus at the bifurcation of the right lower and middle lobar arteries this is nonocclusive. Nonocclusive embolus is present in the left upper lobe. No other definite emboli are present. Mediastinum/Nodes: Subcentimeter prevascular and paratracheal lymph nodes are present. Thoracic inlet is normal. Esophagus is normal. No significant hilar or axillary adenopathy is present. Lungs/Pleura: Dependent atelectasis is present. No significant airspace consolidation is present. There is no nodule or mass lesion. Centrilobular emphysematous changes are present. Upper Abdomen: There is diffuse fatty infiltration of the liver. Limited imaging of the upper abdomen is otherwise unremarkable. Musculoskeletal: Mild endplate changes are present throughout the thoracic spine. Fused anterior osteophytes are noted inferiorly. Sternum is normal. Review of the MIP images confirms the above findings. IMPRESSION: 1. Nonocclusive pulmonary embolus at the bifurcation of the right middle and lower lobar pulmonary arteries. 2. Additional punctate  embolus within a lingular branch. 3.  Aortic Atherosclerosis (ICD10-I70.0). 4. Extensive coronary artery disease including calcifications within the left mainstem coronary artery. 5.  Emphysema (ICD10-J43.9). 6. Mild dependent atelectasis and scarring. 7. Hepatic steatosis. Electronically Signed  By: San Morelle M.D.   On: 09/01/2018 05:02    Pending Labs Unresulted Labs (From admission, onward)    Start     Ordered   09/02/18 0500  CBC  Tomorrow morning,   STAT     09/01/18 0535   09/02/18 0500  Protime-INR  Tomorrow morning,   STAT     09/01/18 0535   09/02/18 5009  Basic metabolic panel  Tomorrow morning,   STAT     09/01/18 0535          Vitals/Pain Today's Vitals   09/01/18 0615 09/01/18 0630 09/01/18 0645 09/01/18 0700  BP: (!) 171/57 (!) 164/69 (!) 150/74 (!) 159/76  Pulse: 71 74 77 72  Resp: (!) 21 (!) 22 17 19   Temp:      SpO2: 96% 97% 95% 95%  Weight:      Height:      PainSc:        Isolation Precautions Droplet and Contact precautions  Medications Medications  0.9 %  sodium chloride infusion (has no administration in time range)  acetaminophen (TYLENOL) tablet 650 mg (has no administration in time range)    Or  acetaminophen (TYLENOL) suppository 650 mg (has no administration in time range)  traZODone (DESYREL) tablet 25 mg (has no administration in time range)  magnesium hydroxide (MILK OF MAGNESIA) suspension 30 mL (has no administration in time range)  ondansetron (ZOFRAN) tablet 4 mg (has no administration in time range)    Or  ondansetron (ZOFRAN) injection 4 mg (has no administration in time range)  enoxaparin (LOVENOX) injection 135 mg (has no administration in time range)  warfarin (COUMADIN) tablet 5 mg (has no administration in time range)  iopamidol (ISOVUE-370) 76 % injection 100 mL (100 mLs Intravenous Contrast Given 09/01/18 0358)  enoxaparin (LOVENOX) injection 135 mg (135 mg Subcutaneous Given 09/01/18 0536)    Mobility walks Low  fall risk   Focused Assessments Pulmonary Assessment Handoff:  Lung sounds:   O2 Device: Room Air        R Recommendations: See Admitting Provider Note  Report given to:   Additional Notes:

## 2018-09-01 NOTE — Consult Note (Signed)
[place] for Warfarin dosing Indication: Acute pulmonary embolism   No Known Allergies  Patient Measurements: Height: 6' (182.9 cm) Weight: 300 lb (136.1 kg) IBW/kg (Calculated) : 77.6   Vital Signs: Temp: 98.8 F (37.1 C) (06/18 2229) BP: 140/79 (06/19 0831) Pulse Rate: 81 (06/19 0831)  Labs: Recent Labs    08/31/18 2233 09/01/18 0253  HGB 15.2  --   HCT 46.3  --   PLT 241  --   CREATININE 0.98  --   TROPONINI <0.03 <0.03    Estimated Creatinine Clearance: 107.4 mL/min (by C-G formula based on SCr of 0.98 mg/dL).   Significant Medications:  No PTA anticoagulants noted.  Enoxaparin 1 mg/kg (136 mg) given once in the ED @ 0536 today.  Trazodone  APAP  Assessment: Ronald Malone  is a 63 y.o. Caucasian male with a known history of coronary artery disease status post MI, status post PCI and stent who was seen in the ED for CP. He will be admitted to the telemetry floor for PE.   Drug interactions: Trazodone - may increase INR/risk of bleeding  APAP (>2g/day)- may increase INR   Goal of Therapy:  INR 2-3 Monitor platelets by anticoagulation protocol: Yes   Plan:  Warfarin 5 mg was ordered in the ED- will continue this dose that is scheduled for 1800 this evening.   Will continue Lovenox 1mg /kg until INR is therapeutic (>2) two times.   Ronald Malone 09/01/2018,9:53 AM

## 2018-09-01 NOTE — ED Notes (Addendum)
Patient transported to Ultrasound 

## 2018-09-02 LAB — CBC
HCT: 42.4 % (ref 39.0–52.0)
Hemoglobin: 13.8 g/dL (ref 13.0–17.0)
MCH: 30.1 pg (ref 26.0–34.0)
MCHC: 32.5 g/dL (ref 30.0–36.0)
MCV: 92.6 fL (ref 80.0–100.0)
Platelets: 206 10*3/uL (ref 150–400)
RBC: 4.58 MIL/uL (ref 4.22–5.81)
RDW: 12.6 % (ref 11.5–15.5)
WBC: 7.2 10*3/uL (ref 4.0–10.5)
nRBC: 0 % (ref 0.0–0.2)

## 2018-09-02 LAB — BASIC METABOLIC PANEL
Anion gap: 8 (ref 5–15)
BUN: 16 mg/dL (ref 8–23)
CO2: 23 mmol/L (ref 22–32)
Calcium: 8.4 mg/dL — ABNORMAL LOW (ref 8.9–10.3)
Chloride: 107 mmol/L (ref 98–111)
Creatinine, Ser: 0.74 mg/dL (ref 0.61–1.24)
GFR calc Af Amer: >60 mL/min (ref >60)
GFR calc non Af Amer: >60 mL/min (ref >60)
Glucose, Bld: 109 mg/dL — ABNORMAL HIGH (ref 70–99)
Potassium: 3.8 mmol/L (ref 3.5–5.1)
Sodium: 138 mmol/L (ref 135–145)

## 2018-09-02 LAB — PROTIME-INR
INR: 1.1 (ref 0.8–1.2)
Prothrombin Time: 13.6 seconds (ref 11.4–15.2)

## 2018-09-02 MED ORDER — SENNOSIDES-DOCUSATE SODIUM 8.6-50 MG PO TABS: 2.0000 | ORAL_TABLET | Freq: Two times a day (BID) | ORAL | 1 refills | Status: DC

## 2018-09-02 MED ORDER — AMLODIPINE BESYLATE 5 MG PO TABS: 5.0000 mg | ORAL_TABLET | Freq: Every day | ORAL | 0 refills | Status: DC

## 2018-09-02 MED ORDER — ELIQUIS 5 MG VTE STARTER PACK: ORAL_TABLET | ORAL | 0 refills | Status: DC

## 2018-09-02 MED ORDER — AMLODIPINE BESYLATE 5 MG PO TABS
5.0000 mg | ORAL_TABLET | Freq: Every day | ORAL | Status: DC
  Administered 2018-09-02: 5 mg via ORAL
  Filled 2018-09-02: qty 1

## 2018-09-02 NOTE — Plan of Care (Signed)
Pt ready for discharge.   Problem: Clinical Measurements: Goal: Will remain free from infection Outcome: Completed/Met Goal: Diagnostic test results will improve Outcome: Completed/Met Goal: Respiratory complications will improve Outcome: Completed/Met Goal: Cardiovascular complication will be avoided Outcome: Completed/Met   Problem: Activity: Goal: Risk for activity intolerance will decrease Outcome: Completed/Met   Problem: Coping: Goal: Level of anxiety will decrease Outcome: Completed/Met   Problem: Safety: Goal: Ability to remain free from injury will improve Outcome: Completed/Met

## 2018-09-02 NOTE — Discharge Summary (Signed)
Ronald Malone at [place] NAME: Ronald Malone    MR#:  [id number]  DATE OF BIRTH:  02/10/1956  DATE OF ADMISSION:  09/01/2018 ADMITTING PHYSICIAN: Christel Mormon, MD  DATE OF DISCHARGE: 09/02/2018   PRIMARY CARE PHYSICIAN: No primary care provider on file.    ADMISSION DIAGNOSIS:  Pulmonary emboli (HCC) [I26.99] Multiple subsegmental pulmonary emboli without acute cor pulmonale [I26.94]  DISCHARGE DIAGNOSIS:  Active Problems:   Acute pulmonary embolism ([place])   SECONDARY DIAGNOSIS:   Past Medical History:  Diagnosis Date  . MI (myocardial infarction) [facility])     HOSPITAL COURSE:   63 year old male patient with history of coronary disease, hyperlipidemia, tobacco abuse currently under hospitalist service  -Acute pulmonary embolism Currently on anticoagulation with full dose Lovenox subcutaneously and oral Coumadin Check echocardiogram- no strain pattern- EF 60% Pharmacy consult for transition to eliquis for anticoagulation started and tolerated, ordered prescription- advised about bleeding risk and take stool softners to avoid constipation  -Hypoxia New onset Oxygen via nasal canula at [place] oxygen- on room air ambulating in room fine  -Hyperlipidemia Statin therapy- follows with Dr. Clayborn Bigness in clinic , appointment soon.  -Coronary artery disease with history of stent Continue aspirin, statin and nitroglycerin sublingually  -Popliteal vein DVT (Non occlusive) Anticoagulation  -Tobacco abuse Tobacco cessation counseled to the patient for 6 minutes Nicotine patch offered  * Hypertension   Started amlodipine.  DISCHARGE CONDITIONS:   Stable.  CONSULTS OBTAINED:    DRUG ALLERGIES:  No Known Allergies  DISCHARGE MEDICATIONS:   Allergies as of 09/02/2018   No Known Allergies     Medication List    TAKE these medications   amLODipine 5 MG tablet Commonly known as: NORVASC Take 1 tablet (5 mg  total) by mouth daily.   Eliquis DVT/PE Starter Pack 5 MG Tabs Take as directed on package: start with two-5mg  tablets twice daily for 7 days. On day 8, switch to one-5mg  tablet twice daily.   senna-docusate 8.6-50 MG tablet Commonly known as: Senokot-S Take 2 tablets by mouth 2 (two) times daily.        DISCHARGE INSTRUCTIONS:    Follow with PMD in 1-2 weeks.  If you experience worsening of your admission symptoms, develop shortness of breath, life threatening emergency, suicidal or homicidal thoughts you must seek medical attention immediately by calling 911 or calling your MD immediately  if symptoms less severe.  You Must read complete instructions/literature along with all the possible adverse reactions/side effects for all the Medicines you take and that have been prescribed to you. Take any new Medicines after you have completely understood and accept all the possible adverse reactions/side effects.   Please note  You were cared for by a hospitalist during your hospital stay. If you have any questions about your discharge medications or the care you received while you were in the hospital after you are discharged, you can call the unit and asked to speak with the hospitalist on call if the hospitalist that took care of you is not available. Once you are discharged, your primary care physician will handle any further medical issues. Please note that NO REFILLS for any discharge medications will be authorized once you are discharged, as it is imperative that you return to your primary care physician (or establish a relationship with a primary care physician if you do not have one) for your aftercare needs so that they can reassess your need for medications and  monitor your lab values.    Today   CHIEF COMPLAINT:   Chief Complaint  Patient presents with  . Chest Pain    HISTORY OF PRESENT ILLNESS:  Ronald Malone  is a 63 y.o. male with a known history of coronary artery  disease status post MI, status post PCI and stent as well as ongoing tobacco abuse, presented to the emergency room with onset of left-sided upper chest wall pain increasing with deep breathing which has been intermittent over the last few nights with associated cough without dyspnea or wheezing.  He continues to smoke 2 packs of cigarettes per day.  He denied any fever or chills.  He has been having bilateral lower extremity edema with no pain.  He denied any recent travels or surgeries.  He stated that his feet would feel numb sometimes.  No bleeding diathesis  Upon presentation to the emergency room, vital signs were within normal and later patient had elevated blood pressure with mild tachypnea.  D-dimer was significantly elevated at 1639.69.  His COVID-19 test came back negative and his EKG showed normal sinus rhythm with a rate of 69.  Chest CT angiography revealed nonocclusive pulmonary embolus at the bifurcation of the right middle and lower lobar pulmonary arteries as well as punctate embolus within the lingular branch, aortic atherosclerosis, extensive coronary artery disease including calcification within the left mainstem coronary artery, emphysema and mild dependent atelectasis and scarring with hepatic steatosis.  The patient was given therapeutic dose of Lovenox.  He will be admitted to a telemetry bed for further evaluation and management.   VITAL SIGNS:  Blood pressure (!) 167/91, pulse 75, temperature 98.7 F (37.1 C), temperature source Oral, resp. rate (!) 22, height 6' (1.829 m), weight (!) 142.8 kg, SpO2 96 %.  I/O:    Intake/Output Summary (Last 24 hours) at 09/02/2018 0951 Last data filed at 09/01/2018 2218 Gross per 24 hour  Intake 228.89 ml  Output 0 ml  Net 228.89 ml    PHYSICAL EXAMINATION:  GENERAL:  63 y.o.-year-old patient lying in the bed with no acute distress.  EYES: Pupils equal, round, reactive to light and accommodation. No scleral icterus. Extraocular  muscles intact.  HEENT: Head atraumatic, normocephalic. Oropharynx and nasopharynx clear.  NECK:  Supple, no jugular venous distention. No thyroid enlargement, no tenderness.  LUNGS: Normal breath sounds bilaterally, no wheezing, rales,rhonchi or crepitation. No use of accessory muscles of respiration.  CARDIOVASCULAR: S1, S2 normal. No murmurs, rubs, or gallops.  ABDOMEN: Soft, non-tender, non-distended. Bowel sounds present. No organomegaly or mass.  EXTREMITIES: No pedal edema, cyanosis, or clubbing.  NEUROLOGIC: Cranial nerves II through XII are intact. Muscle strength 5/5 in all extremities. Sensation intact. Gait not checked.  PSYCHIATRIC: The patient is alert and oriented x 3.  SKIN: No obvious rash, lesion, or ulcer.   DATA REVIEW:   CBC Recent Labs  Lab 09/02/18 0645  WBC 7.2  HGB 13.8  HCT 42.4  PLT 206    Chemistries  Recent Labs  Lab 08/31/18 2233 09/02/18 0645  NA 138 138  K 4.2 3.8  CL 108 107  CO2 22 23  GLUCOSE 119* 109*  BUN 23 16  CREATININE 0.98 0.74  CALCIUM 9.0 8.4*  AST 18  --   ALT 25  --   ALKPHOS 76  --   BILITOT 0.4  --     Cardiac Enzymes Recent Labs  Lab 09/01/18 0253  TROPONINI <0.03    Microbiology Results  Results  for orders placed or performed during the hospital encounter of 09/01/18  SARS Coronavirus 2 (CEPHEID- Performed in [facility]), Hosp Order     Status: None   Collection Time: 09/01/18  3:07 AM   Specimen: Nasopharyngeal Swab  Result Value Ref Range Status   SARS Coronavirus 2 NEGATIVE NEGATIVE Final    Comment: (NOTE) If result is NEGATIVE SARS-CoV-2 target nucleic acids are NOT DETECTED. The SARS-CoV-2 RNA is generally detectable in upper and lower  respiratory specimens during the acute phase of infection. The lowest  concentration of SARS-CoV-2 viral copies this assay can detect is 250  copies / mL. A negative result does not preclude SARS-CoV-2 infection  and should not be used as the sole basis  for treatment or other  patient management decisions.  A negative result may occur with  improper specimen collection / handling, submission of specimen other  than nasopharyngeal swab, presence of viral mutation(s) within the  areas targeted by this assay, and inadequate number of viral copies  (<250 copies / mL). A negative result must be combined with clinical  observations, patient history, and epidemiological information. If result is POSITIVE SARS-CoV-2 target nucleic acids are DETECTED. The SARS-CoV-2 RNA is generally detectable in upper and lower  respiratory specimens dur ing the acute phase of infection.  Positive  results are indicative of active infection with SARS-CoV-2.  Clinical  correlation with patient history and other diagnostic information is  necessary to determine patient infection status.  Positive results do  not rule out bacterial infection or co-infection with other viruses. If result is PRESUMPTIVE POSTIVE SARS-CoV-2 nucleic acids MAY BE PRESENT.   A presumptive positive result was obtained on the submitted specimen  and confirmed on repeat testing.  While 2019 novel coronavirus  (SARS-CoV-2) nucleic acids may be present in the submitted sample  additional confirmatory testing may be necessary for epidemiological  and / or clinical management purposes  to differentiate between  SARS-CoV-2 and other Sarbecovirus currently known to infect humans.  If clinically indicated additional testing with an alternate test  methodology [phone]) is advised. The SARS-CoV-2 RNA is generally  detectable in upper and lower respiratory sp ecimens during the acute  phase of infection. The expected result is Negative. Fact Sheet for Patients:  StrictlyIdeas.no Fact Sheet for Healthcare Providers: BankingDealers.co.za This test is not yet approved or cleared by the [place] FDA and has been authorized for detection and/or  diagnosis of SARS-CoV-2 by FDA under an Emergency Use Authorization (EUA).  This EUA will remain in effect (meaning this test can be used) for the duration of the COVID-19 declaration under Section 564(b)(1) of the Act, 21 U.S.C. section 360bbb-3(b)(1), unless the authorization is terminated or revoked sooner. Performed at [facility], [address] [place], [place] [address]     RADIOLOGY:  Ct Angio Chest Pe W And/or Wo Contrast  Result Date: 09/01/2018 CLINICAL DATA:  Chest pain, complex. Personal history of myocardial infarction. EXAM: CT ANGIOGRAPHY CHEST WITH CONTRAST TECHNIQUE: Multidetector CT imaging of the chest was performed using the standard protocol during bolus administration of intravenous contrast. Multiplanar CT image reconstructions and MIPs were obtained to evaluate the vascular anatomy. CONTRAST:  174mL ISOVUE-370 IOPAMIDOL (ISOVUE-370) INJECTION 76% COMPARISON:  None. FINDINGS: Cardiovascular: Heart size is normal. Extensive atherosclerotic changes are noted within the coronary arteries including the left main stem artery. Calcifications are present at the aortic valve and in the arch. There is no aneurysm. No significant stenosis is present  at the great vessel origins. A 4 vessel arch configuration is present, a normal variant Pulmonary artery opacification is excellent. There is thrombus at the bifurcation of the right lower and middle lobar arteries this is nonocclusive. Nonocclusive embolus is present in the left upper lobe. No other definite emboli are present. Mediastinum/Nodes: Subcentimeter prevascular and paratracheal lymph nodes are present. Thoracic inlet is normal. Esophagus is normal. No significant hilar or axillary adenopathy is present. Lungs/Pleura: Dependent atelectasis is present. No significant airspace consolidation is present. There is no nodule or mass lesion. Centrilobular emphysematous changes are present. Upper Abdomen: There is diffuse fatty  infiltration of the liver. Limited imaging of the upper abdomen is otherwise unremarkable. Musculoskeletal: Mild endplate changes are present throughout the thoracic spine. Fused anterior osteophytes are noted inferiorly. Sternum is normal. Review of the MIP images confirms the above findings. IMPRESSION: 1. Nonocclusive pulmonary embolus at the bifurcation of the right middle and lower lobar pulmonary arteries. 2. Additional punctate embolus within a lingular branch. 3.  Aortic Atherosclerosis (ICD10-I70.0). 4. Extensive coronary artery disease including calcifications within the left mainstem coronary artery. 5.  Emphysema (ICD10-J43.9). 6. Mild dependent atelectasis and scarring. 7. Hepatic steatosis. Electronically Signed   By: San Morelle M.D.   On: 09/01/2018 05:02   US Venous Img Lower Bilateral  Result Date: 09/01/2018 CLINICAL DATA:  Pulmonary embolus. EXAM: BILATERAL LOWER EXTREMITY VENOUS DOPPLER ULTRASOUND TECHNIQUE: Gray-scale sonography with graded compression, as well as color Doppler and duplex ultrasound were performed to evaluate the lower extremity deep venous systems from the level of the common femoral vein and including the common femoral, femoral, profunda femoral, popliteal and calf veins including the posterior tibial, peroneal and gastrocnemius veins when visible. The superficial great saphenous vein was also interrogated. Spectral Doppler was utilized to evaluate flow at rest and with distal augmentation maneuvers in the common femoral, femoral and popliteal veins. COMPARISON:  CT 09/01/2018. FINDINGS: RIGHT LOWER EXTREMITY Common Femoral Vein: No evidence of thrombus. Normal compressibility, respiratory phasicity and response to augmentation. Saphenofemoral Junction: No evidence of thrombus. Normal compressibility and flow on color Doppler imaging. Profunda Femoral Vein: No evidence of thrombus. Normal compressibility and flow on color Doppler imaging. Femoral Vein: No  evidence of thrombus. Normal compressibility, respiratory phasicity and response to augmentation. Popliteal Vein: Nonocclusive thrombus right popliteal vein. Calf Veins: No evidence of thrombus. Normal compressibility and flow on color Doppler imaging. Superficial Great Saphenous Vein: No evidence of thrombus. Normal compressibility. Other Findings:  None. LEFT LOWER EXTREMITY Common Femoral Vein: No evidence of thrombus. Normal compressibility, respiratory phasicity and response to augmentation. Saphenofemoral Junction: No evidence of thrombus. Normal compressibility and flow on color Doppler imaging. Profunda Femoral Vein: No evidence of thrombus. Normal compressibility and flow on color Doppler imaging. Femoral Vein: No evidence of thrombus. Normal compressibility, respiratory phasicity and response to augmentation. Popliteal Vein: No evidence of thrombus. Normal compressibility, respiratory phasicity and response to augmentation. Calf Veins: No evidence of thrombus. Normal compressibility and flow on color Doppler imaging. Superficial Great Saphenous Vein: No evidence of thrombus. Normal compressibility. Other Findings:  None. IMPRESSION: Nonocclusive deep venous thrombosis right popliteal vein. Electronically Signed   By: Marcello Moores  Register   On: 09/01/2018 12:24    EKG:   Orders placed or performed during the hospital encounter of 09/01/18  . ED EKG  . ED EKG      Management plans discussed with the patient, family and they are in agreement.  CODE STATUS:     Code Status  Orders  (From admission, onward)         Start     Ordered   09/01/18 0533  Full code  Continuous     09/01/18 0535        Code Status History    This patient has a current code status but no historical code status.   Advance Care Planning Activity      TOTAL TIME TAKING CARE OF THIS PATIENT: 35 minutes.    Vaughan Basta M.D on 09/02/2018 at 9:51 AM  Between 7am to 6pm - Pager - [phone]  After  6pm go to [email or web address] - password [email or web address]  Office  [phone]  CC: Primary care physician; No primary care provider on file.   Note: This dictation was prepared with Dragon dictation along with smaller phrase technology. Any transcriptional errors that result from this process are unintentional.

## 2018-09-02 NOTE — Discharge Instructions (Signed)
Resume activity slowly. Follow a heart healthy diet. Stop smoking.  Take the following medications: Aspirin Eliquis Norvasc  Use nitroglycerin sublingually for chest pain.  Take statin as prescribed by Dr. Clayborn Bigness.   Bleeding Precautions When on Anticoagulant Therapy, Adult Anticoagulant therapy, also called blood thinner therapy, is medicine that helps to prevent and treat blood clots. The medicine works by stopping blood clots from forming or growing. Blood clots that form in your blood vessels can be dangerous. They can break loose and travel to the heart, lungs, or brain. This increases the risk of a heart attack, stroke, or blocked lung artery (pulmonary embolism). Anticoagulants also increase the risk of bleeding. Try to protect yourself from cuts and other injuries that can cause bleeding. It is important to take anticoagulants exactly as told by your health care provider. Why do I need to be on anticoagulant therapy? You may need this medicine if you are at risk of developing a blood clot. Conditions that increase your risk of a blood clot include:  Being born with heart disease or a heart malformation (congenital heart disease).  Developing heart disease.  Having had surgery, such as valve replacement.  Having had a serious accident or other type of severe injury (trauma).  Having certain types of cancer.  Having certain diseases that can increase blood clotting.  Having a high risk of stroke or heart attack.  Having atrial fibrillation (AF). What are the common anticoagulant medicines? There are several types of anticoagulant medicines. The most common types are:  Medicines that you take by mouth (oral medicines), such as: ? Warfarin. ? Novel oral anticoagulants (NOACs), such as: ? Direct thrombin inhibitors (dabigatran). ? Factor Xa inhibitors (apixaban, edoxaban, and rivaroxaban).  Injections, such as: ? Unfractionated heparin. ? Low molecular weight  heparin. These anticoagulants work in different ways to prevent blood clots. They also have different risks and side effects. What do I need to remember while on anticoagulant therapy? Taking anticoagulants  Take your medicine at the same time every day. If you forget to take your medicine, take it as soon as you remember. Do not double your dosage of medicine if you miss a whole day. Take your normal dose and call your health care provider.  Do not stop taking your medicine unless your health care provider approves. Stopping the medicine can increase your risk of developing a blood clot. Taking other medicines  Take over-the-counter and prescriptions medicines only as told by your health care provider.  Do not take over-the-counter NSAIDs, including aspirin and ibuprofen, while you are on anticoagulant therapy. These medicines increase your risk of dangerous bleeding.  Get approval from your health care provider before you start taking any new medicines, vitamins, or herbal products. Some of these could interfere with your therapy. General instructions  Keep all follow-up visits as told by your health care provider. This is important.  If you are pregnant or trying to get pregnant, talk with a health care provider about anticoagulants. Some of these medicines are not safe to take during pregnancy.  Tell all health care providers, including your dentist, that you are on anticoagulant therapy. It is especially important to tell providers before you have any surgery, medical procedures, or dental work done. What precautions should I take?   Be very careful when using knives, scissors, or other sharp objects.  Use an electric razor instead of a blade.  Do not use toothpicks.  Use a soft-bristled toothbrush. Brush your teeth gently.  Always  wear shoes outdoors and wear slippers indoors.  Be careful when cutting your fingernails and toenails.  Place bath mats in the bathroom. If  possible, install handrails as well.  Wear gloves while you do yard work.  Wear your seat belt.  Prevent falls by removing loose rugs and extension cords from areas where you walk. Use a cane or walker if you need it.  Avoid constipation by: ? Drinking enough fluid to keep your urine clear or pale yellow. ? Eating foods that are high in fiber, such as fresh fruits and vegetables, whole grains, and beans. ? Limiting foods that are high in fat and processed sugars, such as fried and sweet foods.  Do not play contact sports or participate in other activities that have a high risk for injury. What other precautions are important if on warfarin therapy? If you are taking a type of anticoagulant called warfarin, make sure you:  Work with a diet and nutrition specialist (dietitian) to make an eating plan. Do not make any sudden changes to your diet after you have started your eating plan.  Do not drink alcohol. It can interfere with your medicine and increase your risk of an injury that causes bleeding.  Get regular blood tests as told by your health care provider. What are some questions to ask my health care provider?  Why do I need anticoagulant therapy?  What is the best anticoagulant therapy for my condition?  How long will I need anticoagulant therapy?  What are the side effects of anticoagulant therapy?  When should I take my medicine? What should I do if I forget to take it?  Will I need to have regular blood tests?  Do I need to change my diet? Are there foods or drinks that I should avoid?  What activities are safe for me?  What should I do if I want to get pregnant? Contact a health care provider if:  You miss a dose of medicine: ? And you are not sure what to do. ? For more than one day.  You have: ? Menstrual bleeding that is heavier than normal. ? Bloody or brown urine. ? Easy bruising. ? Black and tarry stool or bright red stool. ? Side effects from your  medicine.  You feel weak or dizzy.  You become pregnant. Get help right away if:  You have bleeding that will not stop within 20 minutes from: ? The nose. ? The gums. ? A cut on the skin.  You have a severe headache or stomachache.  You vomit or cough up blood.  You fall or hit your head. Summary  Anticoagulant therapy, also called blood thinner therapy, is medicine that helps to prevent and treat blood clots.  Anticoagulants work in different ways to prevent blood clots. They also have different risks and side effects.  Talk with your health care provider about any precautions that you should take while on anticoagulant therapy. This information is not intended to replace advice given to you by your health care provider. Make sure you discuss any questions you have with your health care provider. Document Released: 02/10/2015 Document Revised: 05/18/2016 Document Reviewed: 05/18/2016 Elsevier Interactive Patient Education  2019 [employer].

## 2018-09-07 DIAGNOSIS — I2510 Atherosclerotic heart disease of native coronary artery without angina pectoris: Secondary | ICD-10-CM | POA: Diagnosis not present

## 2018-09-07 DIAGNOSIS — I82439 Acute embolism and thrombosis of unspecified popliteal vein: Secondary | ICD-10-CM | POA: Diagnosis not present

## 2018-09-07 DIAGNOSIS — I2699 Other pulmonary embolism without acute cor pulmonale: Secondary | ICD-10-CM | POA: Diagnosis not present

## 2018-09-07 DIAGNOSIS — I219 Acute myocardial infarction, unspecified: Secondary | ICD-10-CM | POA: Diagnosis not present

## 2018-09-07 DIAGNOSIS — I824Z9 Acute embolism and thrombosis of unspecified deep veins of unspecified distal lower extremity: Secondary | ICD-10-CM | POA: Diagnosis not present

## 2018-09-19 ENCOUNTER — Encounter (INDEPENDENT_AMBULATORY_CARE_PROVIDER_SITE_OTHER): Payer: Self-pay | Admitting: Vascular Surgery

## 2018-09-19 ENCOUNTER — Ambulatory Visit (INDEPENDENT_AMBULATORY_CARE_PROVIDER_SITE_OTHER): Payer: Medicare Other | Admitting: Vascular Surgery

## 2018-09-19 ENCOUNTER — Other Ambulatory Visit: Payer: Self-pay

## 2018-09-19 VITALS — BP 132/79 | HR 73 | Resp 10 | Ht 72.0 in | Wt 321.0 lb

## 2018-09-19 DIAGNOSIS — M79605 Pain in left leg: Secondary | ICD-10-CM

## 2018-09-19 DIAGNOSIS — M79604 Pain in right leg: Secondary | ICD-10-CM

## 2018-09-19 DIAGNOSIS — M79609 Pain in unspecified limb: Secondary | ICD-10-CM | POA: Insufficient documentation

## 2018-09-19 DIAGNOSIS — Z87891 Personal history of nicotine dependence: Secondary | ICD-10-CM | POA: Insufficient documentation

## 2018-09-19 DIAGNOSIS — I82431 Acute embolism and thrombosis of right popliteal vein: Secondary | ICD-10-CM | POA: Diagnosis not present

## 2018-09-19 DIAGNOSIS — I2699 Other pulmonary embolism without acute cor pulmonale: Secondary | ICD-10-CM | POA: Diagnosis not present

## 2018-09-19 DIAGNOSIS — I82409 Acute embolism and thrombosis of unspecified deep veins of unspecified lower extremity: Secondary | ICD-10-CM | POA: Insufficient documentation

## 2018-09-19 DIAGNOSIS — F17200 Nicotine dependence, unspecified, uncomplicated: Secondary | ICD-10-CM

## 2018-09-19 NOTE — Assessment & Plan Note (Signed)
The patient has a right lower extremity DVT and a pulmonary embolus.  Although these are technically unprovoked, this occurred in a morbidly obese smoker which in and of itself does put him at a higher risk.  I would generally recommend 6 months of anticoagulation and at the end of 6 months would discuss either using a lower dose of 1 of the newer Noag anticoagulants versus aspirin.  Given his size and somewhat unprovoked state, a lower dose of the anticoagulant may be preferable.  We will plan on seeing him back towards the end of the year to discuss these options.

## 2018-09-19 NOTE — Assessment & Plan Note (Signed)
His treatment for his pulmonary embolus would also entail the treatment for his DVT.

## 2018-09-19 NOTE — Assessment & Plan Note (Signed)
Definitely increases his clotting risk and cessation would be of benefit.

## 2018-09-19 NOTE — Assessment & Plan Note (Signed)
The patient describes numbness and tingling in the lower extremity which sounds neuropathic in nature.  Given his DVT, some degree of venous hypertension may be contributing.  He does have multiple atherosclerotic risk factors and assessment of PAD is certainly a reasonable option.  We will check that in the near future at his convenience.  I will see him back following that study to discuss the results and determine further treatment options.

## 2018-09-19 NOTE — Progress Notes (Signed)
Patient ID: Ronald Malone, male   DOB: [birth year], 63 y.o.   MRN: [id number]  Chief Complaint  Patient presents with   New Patient (Initial Visit)    HPI Ronald Malone is a 63 y.o. male.  I am asked to see the patient by Dr. Clayborn Bigness for evaluation of a Pulmonary Embolus and RLE DVT.  This was an unprovoked thromboembolic event occurring a few weeks ago.  He has been started on anticoagulation and has been tolerating that well.  His biggest complaint today is of numbness and pain in his feet and lower legs.  He does have some discoloration and swelling in the right lower extremity although that does not bother him as much.  He is concerned about pain in his legs with walking and has been reading about peripheral arterial disease and is very concerned about that.  He reports no open wounds or infections.  He denies fever or chills.  He was having chest pain when the pulmonary embolus was originally diagnosed but that has improved.  No increase in shortness of breath over baseline.   Past Medical History:  Diagnosis Date   MI (myocardial infarction) ([place])    Pulmonary embolism ([place])     Past Surgical History:  Procedure Laterality Date   CARDIAC CATHETERIZATION      Family History No history of bleeding disorders, clotting disorders, autoimmune diseases, or aneurysms.  Social History Social History   Tobacco Use   Smoking status: Current Every Day Smoker   Smokeless tobacco: Never Used  Substance Use Topics   Alcohol use: Yes   Drug use: Not on file    No Known Allergies  Current Outpatient Medications  Medication Sig Dispense Refill   atorvastatin (LIPITOR) 40 MG tablet Take 40 mg by mouth daily.     Eliquis DVT/PE Starter Pack (ELIQUIS STARTER PACK) 5 MG TABS Take as directed on package: start with two-5mg  tablets twice daily for 7 days. On day 8, switch to one-5mg  tablet twice daily. 1 each 0   senna-docusate (SENOKOT-S) 8.6-50 MG tablet Take 2 tablets  by mouth 2 (two) times daily. 30 tablet 1   amLODipine (NORVASC) 5 MG tablet Take 1 tablet (5 mg total) by mouth daily. (Patient not taking: Reported on 09/19/2018) 30 tablet 0   No current facility-administered medications for this visit.       REVIEW OF SYSTEMS (Negative unless checked)  Constitutional: [] Weight loss  [] Fever  [] Chills Cardiac: [] Chest pain   [] Chest pressure   [] Palpitations   [] Shortness of breath when laying flat   [] Shortness of breath at rest   [] Shortness of breath with exertion. Vascular:  [] Pain in legs with walking   [] Pain in legs at rest   [] Pain in legs when laying flat   [] Claudication   [] Pain in feet when walking  [] Pain in feet at rest  [] Pain in feet when laying flat   [x] History of DVT   [x] Phlebitis   [x] Swelling in legs   [] Varicose veins   [] Non-healing ulcers Pulmonary:   [] Uses home oxygen   [] Productive cough   [] Hemoptysis   [] Wheeze  [] COPD   [] Asthma Neurologic:  [] Dizziness  [] Blackouts   [] Seizures   [] History of stroke   [] History of TIA  [] Aphasia   [] Temporary blindness   [] Dysphagia   [] Weakness or numbness in arms   [x] Weakness or numbness in legs Musculoskeletal:  [x] Arthritis   [] Joint swelling   [] Joint pain   [] Low back pain  Hematologic:  [] Easy bruising  [] Easy bleeding   [] Hypercoagulable state   [] Anemic  [] Hepatitis Gastrointestinal:  [] Blood in stool   [] Vomiting blood  [] Gastroesophageal reflux/heartburn   [] Abdominal pain Genitourinary:  [] Chronic kidney disease   [] Difficult urination  [] Frequent urination  [] Burning with urination   [] Hematuria Skin:  [] Rashes   [] Ulcers   [] Wounds Psychological:  [] History of anxiety   []  History of major depression.    Physical Exam BP 132/79 (BP Location: Left Arm, Patient Position: Sitting, Cuff Size: Large)    Pulse 73    Resp 10    Ht 6' (1.829 m)    Wt (!) 321 lb (145.6 kg)    BMI 43.54 kg/m  Gen:  WD/WN, NAD.  Morbidly obese Head: [place]/AT, No temporalis wasting.  Ear/Nose/Throat:  Hearing grossly intact, nares w/o erythema or drainage, oropharynx w/o Erythema/Exudate Eyes: Conjunctiva clear, sclera non-icteric  Neck: trachea midline.  No JVD.  Pulmonary:  Good air movement, respirations not labored, no use of accessory muscles  Cardiac: RRR, no JVD Vascular:  Vessel Right Left  Radial Palpable Palpable                          DP 1+ 1+  PT NP 1+    Gastrointestinal:. No masses, surgical incisions, or scars. Musculoskeletal: M/S 5/5 throughout.  Extremities without ischemic changes.  No deformity or atrophy.  Mild stasis dermatitis changes are present in the right lower extremity.  2+ right lower extremity edema, 1+ left lower extremity edema. Neurologic: Sensation grossly intact in extremities.  Symmetrical.  Speech is fluent. Motor exam as listed above. Psychiatric: Judgment intact, Mood & affect appropriate for pt's clinical situation. Dermatologic: No rashes or ulcers noted.  No cellulitis or open wounds.    Radiology Ct Angio Chest Pe W And/or Wo Contrast  Result Date: 09/01/2018 CLINICAL DATA:  Chest pain, complex. Personal history of myocardial infarction. EXAM: CT ANGIOGRAPHY CHEST WITH CONTRAST TECHNIQUE: Multidetector CT imaging of the chest was performed using the standard protocol during bolus administration of intravenous contrast. Multiplanar CT image reconstructions and MIPs were obtained to evaluate the vascular anatomy. CONTRAST:  119mL ISOVUE-370 IOPAMIDOL (ISOVUE-370) INJECTION 76% COMPARISON:  None. FINDINGS: Cardiovascular: Heart size is normal. Extensive atherosclerotic changes are noted within the coronary arteries including the left main stem artery. Calcifications are present at the aortic valve and in the arch. There is no aneurysm. No significant stenosis is present at the great vessel origins. A 4 vessel arch configuration is present, a normal variant Pulmonary artery opacification is excellent. There is thrombus at the bifurcation of the  right lower and middle lobar arteries this is nonocclusive. Nonocclusive embolus is present in the left upper lobe. No other definite emboli are present. Mediastinum/Nodes: Subcentimeter prevascular and paratracheal lymph nodes are present. Thoracic inlet is normal. Esophagus is normal. No significant hilar or axillary adenopathy is present. Lungs/Pleura: Dependent atelectasis is present. No significant airspace consolidation is present. There is no nodule or mass lesion. Centrilobular emphysematous changes are present. Upper Abdomen: There is diffuse fatty infiltration of the liver. Limited imaging of the upper abdomen is otherwise unremarkable. Musculoskeletal: Mild endplate changes are present throughout the thoracic spine. Fused anterior osteophytes are noted inferiorly. Sternum is normal. Review of the MIP images confirms the above findings. IMPRESSION: 1. Nonocclusive pulmonary embolus at the bifurcation of the right middle and lower lobar pulmonary arteries. 2. Additional punctate embolus within a lingular branch. 3.  Aortic Atherosclerosis (  ICD10-I70.0). 4. Extensive coronary artery disease including calcifications within the left mainstem coronary artery. 5.  Emphysema (ICD10-J43.9). 6. Mild dependent atelectasis and scarring. 7. Hepatic steatosis. Electronically Signed   By: San Morelle M.D.   On: 09/01/2018 05:02   US Venous Img Lower Bilateral  Result Date: 09/01/2018 CLINICAL DATA:  Pulmonary embolus. EXAM: BILATERAL LOWER EXTREMITY VENOUS DOPPLER ULTRASOUND TECHNIQUE: Gray-scale sonography with graded compression, as well as color Doppler and duplex ultrasound were performed to evaluate the lower extremity deep venous systems from the level of the common femoral vein and including the common femoral, femoral, profunda femoral, popliteal and calf veins including the posterior tibial, peroneal and gastrocnemius veins when visible. The superficial great saphenous vein was also interrogated.  Spectral Doppler was utilized to evaluate flow at rest and with distal augmentation maneuvers in the common femoral, femoral and popliteal veins. COMPARISON:  CT 09/01/2018. FINDINGS: RIGHT LOWER EXTREMITY Common Femoral Vein: No evidence of thrombus. Normal compressibility, respiratory phasicity and response to augmentation. Saphenofemoral Junction: No evidence of thrombus. Normal compressibility and flow on color Doppler imaging. Profunda Femoral Vein: No evidence of thrombus. Normal compressibility and flow on color Doppler imaging. Femoral Vein: No evidence of thrombus. Normal compressibility, respiratory phasicity and response to augmentation. Popliteal Vein: Nonocclusive thrombus right popliteal vein. Calf Veins: No evidence of thrombus. Normal compressibility and flow on color Doppler imaging. Superficial Great Saphenous Vein: No evidence of thrombus. Normal compressibility. Other Findings:  None. LEFT LOWER EXTREMITY Common Femoral Vein: No evidence of thrombus. Normal compressibility, respiratory phasicity and response to augmentation. Saphenofemoral Junction: No evidence of thrombus. Normal compressibility and flow on color Doppler imaging. Profunda Femoral Vein: No evidence of thrombus. Normal compressibility and flow on color Doppler imaging. Femoral Vein: No evidence of thrombus. Normal compressibility, respiratory phasicity and response to augmentation. Popliteal Vein: No evidence of thrombus. Normal compressibility, respiratory phasicity and response to augmentation. Calf Veins: No evidence of thrombus. Normal compressibility and flow on color Doppler imaging. Superficial Great Saphenous Vein: No evidence of thrombus. Normal compressibility. Other Findings:  None. IMPRESSION: Nonocclusive deep venous thrombosis right popliteal vein. Electronically Signed   By: Marcello Moores  Register   On: 09/01/2018 12:24    Labs Recent Results (from the past 2160 hour(s))  CBC with Differential     Status: None    Collection Time: 08/31/18 10:33 PM  Result Value Ref Range   WBC 10.0 4.0 - 10.5 K/uL   RBC 4.99 4.22 - 5.81 MIL/uL   Hemoglobin 15.2 13.0 - 17.0 g/dL   HCT 46.3 39.0 - 52.0 %   MCV 92.8 80.0 - 100.0 fL   MCH 30.5 26.0 - 34.0 pg   MCHC 32.8 30.0 - 36.0 g/dL   RDW 12.7 11.5 - 15.5 %   Platelets 241 150 - 400 K/uL   nRBC 0.0 0.0 - 0.2 %   Neutrophils Relative % 63 %   Neutro Abs 6.4 1.7 - 7.7 K/uL   Lymphocytes Relative 22 %   Lymphs Abs 2.2 0.7 - 4.0 K/uL   Monocytes Relative 10 %   Monocytes Absolute 1.0 0.1 - 1.0 K/uL   Eosinophils Relative 3 %   Eosinophils Absolute 0.3 0.0 - 0.5 K/uL   Basophils Relative 1 %   Basophils Absolute 0.1 0.0 - 0.1 K/uL   Immature Granulocytes 1 %   Abs Immature Granulocytes 0.05 0.00 - 0.07 K/uL    Comment: Performed at [facility], [address], [place], [place] [address]  Comprehensive metabolic panel  Status: Abnormal   Collection Time: 08/31/18 10:33 PM  Result Value Ref Range   Sodium 138 135 - 145 mmol/L   Potassium 4.2 3.5 - 5.1 mmol/L   Chloride 108 98 - 111 mmol/L   CO2 22 22 - 32 mmol/L   Glucose, Bld 119 (H) 70 - 99 mg/dL   BUN 23 8 - 23 mg/dL   Creatinine, Ser 0.98 0.61 - 1.24 mg/dL   Calcium 9.0 8.9 - 10.3 mg/dL   Total Protein 7.8 6.5 - 8.1 g/dL   Albumin 4.1 3.5 - 5.0 g/dL   AST 18 15 - 41 U/L   ALT 25 0 - 44 U/L   Alkaline Phosphatase 76 38 - 126 U/L   Total Bilirubin 0.4 0.3 - 1.2 mg/dL   GFR calc non Af Amer >60 >60 mL/min   GFR calc Af Amer >60 >60 mL/min   Anion gap 8 5 - 15    Comment: Performed at [facility], [address] [place], [place] [address]  Troponin I - ONCE - STAT     Status: None   Collection Time: 08/31/18 10:33 PM  Result Value Ref Range   Troponin I <0.03 <0.03 ng/mL    Comment: Performed at [facility], [address] [place], [place] [address]  Fibrin derivatives [facility] only)     Status: Abnormal   Collection Time: 09/01/18  2:53 AM  Result Value  Ref Range   Fibrin derivatives D-dimer ([facility]) 1,639.69 (H) 0.00 - 499.00 ng/mL (FEU)    Comment: (NOTE) <> Exclusion of Venous Thromboembolism (VTE) - OUTPATIENT ONLY   (Emergency Department or [place])   0-499 ng/ml (FEU): With a low to intermediate pretest probability                      for VTE this test result excludes the diagnosis                      of VTE.   >499 ng/ml (FEU) : VTE not excluded; additional work up for VTE is                      required. <> Testing on Inpatients and Evaluation of Disseminated Intravascular   Coagulation (DIC) Reference Range:   0-499 ng/ml (FEU) Performed at [facility], [address] [place], [place] [address]   Troponin I - ONCE - STAT     Status: None   Collection Time: 09/01/18  2:53 AM  Result Value Ref Range   Troponin I <0.03 <0.03 ng/mL    Comment: Performed at [facility], [address] [place], [place] [address]  SARS Coronavirus 2 (CEPHEID- Performed in [place] hospital lab), Hosp Order     Status: None   Collection Time: 09/01/18  3:07 AM   Specimen: Nasopharyngeal Swab  Result Value Ref Range   SARS Coronavirus 2 NEGATIVE NEGATIVE    Comment: (NOTE) If result is NEGATIVE SARS-CoV-2 target nucleic acids are NOT DETECTED. The SARS-CoV-2 RNA is generally detectable in upper and lower  respiratory specimens during the acute phase of infection. The lowest  concentration of SARS-CoV-2 viral copies this assay can detect is 250  copies / mL. A negative result does not preclude SARS-CoV-2 infection  and should not be used as the sole basis for treatment or other  patient management decisions.  A negative result may occur with  improper specimen collection /  handling, submission of specimen other  than nasopharyngeal swab, presence of viral mutation(s) within the  areas targeted by this assay, and inadequate number of viral copies  (<250 copies / mL). A negative result must be combined with clinical    observations, patient history, and epidemiological information. If result is POSITIVE SARS-CoV-2 target nucleic acids are DETECTED. The SARS-CoV-2 RNA is generally detectable in upper and lower  respiratory specimens dur ing the acute phase of infection.  Positive  results are indicative of active infection with SARS-CoV-2.  Clinical  correlation with patient history and other diagnostic information is  necessary to determine patient infection status.  Positive results do  not rule out bacterial infection or co-infection with other viruses. If result is PRESUMPTIVE POSTIVE SARS-CoV-2 nucleic acids MAY BE PRESENT.   A presumptive positive result was obtained on the submitted specimen  and confirmed on repeat testing.  While 2019 novel coronavirus  (SARS-CoV-2) nucleic acids may be present in the submitted sample  additional confirmatory testing may be necessary for epidemiological  and / or clinical management purposes  to differentiate between  SARS-CoV-2 and other Sarbecovirus currently known to infect humans.  If clinically indicated additional testing with an alternate test  methodology [phone]) is advised. The SARS-CoV-2 RNA is generally  detectable in upper and lower respiratory sp ecimens during the acute  phase of infection. The expected result is Negative. Fact Sheet for Patients:  StrictlyIdeas.no Fact Sheet for Healthcare Providers: BankingDealers.co.za This test is not yet approved or cleared by the [place] FDA and has been authorized for detection and/or diagnosis of SARS-CoV-2 by FDA under an Emergency Use Authorization (EUA).  This EUA will remain in effect (meaning this test can be used) for the duration of the COVID-19 declaration under Section 564(b)(1) of the Act, 21 U.S.C. section 360bbb-3(b)(1), unless the authorization is terminated or revoked sooner. Performed at [facility], [address] [place], [place] [address]   ECHOCARDIOGRAM COMPLETE     Status: None   Collection Time: 09/01/18  9:48 AM  Result Value Ref Range   BP 140/79 mmHg  CBC     Status: None   Collection Time: 09/02/18  6:45 AM  Result Value Ref Range   WBC 7.2 4.0 - 10.5 K/uL   RBC 4.58 4.22 - 5.81 MIL/uL   Hemoglobin 13.8 13.0 - 17.0 g/dL   HCT 42.4 39.0 - 52.0 %   MCV 92.6 80.0 - 100.0 fL   MCH 30.1 26.0 - 34.0 pg   MCHC 32.5 30.0 - 36.0 g/dL   RDW 12.6 11.5 - 15.5 %   Platelets 206 150 - 400 K/uL   nRBC 0.0 0.0 - 0.2 %    Comment: Performed at [facility], [address] [place], [place] [address]  Protime-INR     Status: None   Collection Time: 09/02/18  6:45 AM  Result Value Ref Range   Prothrombin Time 13.6 11.4 - 15.2 seconds   INR 1.1 0.8 - 1.2    Comment: (NOTE) INR goal varies based on device and disease states. Performed at [facility], [address] [place], [place] [address]   Basic metabolic panel     Status: Abnormal   Collection Time: 09/02/18  6:45 AM  Result Value Ref Range   Sodium 138 135 - 145 mmol/L   Potassium 3.8 3.5 - 5.1 mmol/L   Chloride 107 98 - 111 mmol/L   CO2 23 22 - 32 mmol/L  Glucose, Bld 109 (H) 70 - 99 mg/dL   BUN 16 8 - 23 mg/dL   Creatinine, Ser 0.74 0.61 - 1.24 mg/dL   Calcium 8.4 (L) 8.9 - 10.3 mg/dL   GFR calc non Af Amer >60 >60 mL/min   GFR calc Af Amer >60 >60 mL/min   Anion gap 8 5 - 15    Comment: Performed at [facility], [address] [place], [place] [address]    Assessment/Plan:  Acute pulmonary embolism [facility]) The patient has a right lower extremity DVT and a pulmonary embolus.  Although these are technically unprovoked, this occurred in a morbidly obese smoker which in and of itself does put him at a higher risk.  I would generally recommend 6 months of anticoagulation and at the end of 6 months would discuss either using a lower dose of 1 of the newer Noag anticoagulants versus aspirin.  Given his  size and somewhat unprovoked state, a lower dose of the anticoagulant may be preferable.  We will plan on seeing him back towards the end of the year to discuss these options.  DVT (deep venous thrombosis) ([place]) His treatment for his pulmonary embolus would also entail the treatment for his DVT.  Tobacco use disorder Definitely increases his clotting risk and cessation would be of benefit.  Pain in limb The patient describes numbness and tingling in the lower extremity which sounds neuropathic in nature.  Given his DVT, some degree of venous hypertension may be contributing.  He does have multiple atherosclerotic risk factors and assessment of PAD is certainly a reasonable option.  We will check that in the near future at his convenience.  I will see him back following that study to discuss the results and determine further treatment options.      Leotis Pain 09/19/2018, 5:01 PM   This note was created with Dragon medical transcription system.  Any errors from dictation are unintentional.

## 2018-09-19 NOTE — Patient Instructions (Signed)
Pulmonary Embolism  A pulmonary embolism (PE) is a sudden blockage or decrease of blood flow in one or both lungs. Most blockages come from a blood clot that forms in the vein of a lower leg, thigh, or arm (deep vein thrombosis, DVT) and travels to the lungs. A clot is blood that has thickened into a gel or solid. PE is a dangerous and life-threatening condition that needs to be treated right away. What are the causes? This condition is usually caused by a blood clot that forms in a vein and moves to the lungs. In rare cases, it may be caused by air, fat, part of a tumor, or other tissue that moves through the veins and into the lungs. What increases the risk? The following factors may make you more likely to develop this condition:  Experiencing a traumatic injury, such as breaking a hip or leg.  Having: ? A spinal cord injury. ? Orthopedic surgery, especially hip or knee replacement. ? Any major surgery. ? A stroke. ? DVT. ? Blood clots or blood clotting disease. ? Long-term (chronic) lung or heart disease. ? Cancer treated with chemotherapy. ? A central venous catheter.  Taking medicines that contain estrogen. These include birth control pills and hormone replacement therapy.  Being: ? Pregnant. ? In the period of time after your baby is delivered (postpartum). ? Older than age 60. ? Overweight. ? A smoker, especially if you have other risks. What are the signs or symptoms? Symptoms of this condition usually start suddenly and include:  Shortness of breath during activity or at rest.  Coughing, coughing up blood, or coughing up blood-tinged mucus.  Chest pain that is often worse with deep breaths.  Rapid or irregular heartbeat.  Feeling light-headed or dizzy.  Fainting.  Feeling anxious.  Fever.  Sweating.  Pain and swelling in a leg. This is a symptom of DVT, which can lead to PE. How is this diagnosed? This condition may be diagnosed based on:  Your medical  history.  A physical exam.  Blood tests.  CT pulmonary angiogram. This test checks blood flow in and around your lungs.  Ventilation-perfusion scan, also called a lung VQ scan. This test measures air flow and blood flow to the lungs.  An ultrasound of the legs. How is this treated? Treatment for this condition depends on many factors, such as the cause of your PE, your risk for bleeding or developing more clots, and other medical conditions you have. Treatment aims to remove, dissolve, or stop blood clots from forming or growing larger. Treatment may include:  Medicines, such as: ? Blood thinning medicines (anticoagulants) to stop clots from forming. ? Medicines that dissolve clots (thrombolytics).  Procedures, such as: ? Using a flexible tube to remove a blood clot (embolectomy) or to deliver medicine to destroy it (catheter-directed thrombolysis). ? Inserting a filter into a large vein that carries blood to the heart (inferior vena cava). This filter (vena cava filter) catches blood clots before they reach the lungs. ? Surgery to remove the clot (surgical embolectomy). This is rare. You may need a combination of immediate, long-term (up to 3 months after diagnosis), and extended (more than 3 months after diagnosis) treatments. Your treatment may continue for several months (maintenance therapy). You and your health care provider will work together to choose the treatment program that is best for you. Follow these instructions at home: Medicines  Take over-the-counter and prescription medicines only as told by your health care provider.  If you   are taking an anticoagulant medicine: ? Take the medicine every day at the same time each day. ? Understand what foods and drugs interact with your medicine. ? Understand the side effects of this medicine, including excessive bruising or bleeding. Ask your health care provider or pharmacist about other side effects. General instructions   Wear a medical alert bracelet or carry a medical alert card that says you have had a PE and lists what medicines you take.  Ask your health care provider when you may return to your normal activities. Avoid sitting or lying for a long time without moving.  Maintain a healthy weight. Ask your health care provider what weight is healthy for you.  Do not use any products that contain nicotine or tobacco, such as cigarettes, e-cigarettes, and chewing tobacco. If you need help quitting, ask your health care provider.  Talk with your health care provider about any travel plans. It is important to make sure that you are still able to take your medicine while on trips.  Keep all follow-up visits as told by your health care provider. This is important. Contact a health care provider if:  You missed a dose of your blood thinner medicine. Get help right away if:  You have: ? New or increased pain, swelling, warmth, or redness in an arm or leg. ? Numbness or tingling in an arm or leg. ? Shortness of breath during activity or at rest. ? A fever. ? Chest pain. ? A rapid or irregular heartbeat. ? A severe headache. ? Vision changes. ? A serious fall or accident, or you hit your head. ? Stomach (abdominal) pain. ? Blood in your vomit, stool, or urine. ? A cut that will not stop bleeding.  You cough up blood.  You feel light-headed or dizzy.  You cannot move your arms or legs.  You are confused or have memory loss. These symptoms may represent a serious problem that is an emergency. Do not wait to see if the symptoms will go away. Get medical help right away. Call your local emergency services (911 in the [place] Do not drive yourself to the hospital. Summary  A pulmonary embolism (PE) is a sudden blockage or decrease of blood flow in one or both lungs. PE is a dangerous and life-threatening condition that needs to be treated right away.  Treatments for this condition usually include  medicines to thin your blood (anticoagulants) or medicines to break apart blood clots (thrombolytics).  If you are given blood thinners, it is important to take the medicine every day at the same time each day.  Understand what foods and drugs interact with any medicines that you are taking.  If you have signs of PE or DVT, call your local emergency services (911 in the [place] This information is not intended to replace advice given to you by your health care provider. Make sure you discuss any questions you have with your health care provider. Document Released: 02/27/2000 Document Revised: 12/07/2017 Document Reviewed: 12/07/2017 Elsevier Patient Education  2020 [employer]  

## 2018-09-26 ENCOUNTER — Ambulatory Visit (INDEPENDENT_AMBULATORY_CARE_PROVIDER_SITE_OTHER): Payer: Medicare Other | Admitting: Nurse Practitioner

## 2018-09-26 ENCOUNTER — Ambulatory Visit (INDEPENDENT_AMBULATORY_CARE_PROVIDER_SITE_OTHER): Payer: Medicare Other

## 2018-09-26 ENCOUNTER — Encounter (INDEPENDENT_AMBULATORY_CARE_PROVIDER_SITE_OTHER): Payer: Self-pay | Admitting: Nurse Practitioner

## 2018-09-26 ENCOUNTER — Other Ambulatory Visit: Payer: Self-pay

## 2018-09-26 VITALS — BP 133/79 | HR 63 | Resp 16 | Ht 72.0 in | Wt 321.0 lb

## 2018-09-26 DIAGNOSIS — M79605 Pain in left leg: Secondary | ICD-10-CM | POA: Diagnosis not present

## 2018-09-26 DIAGNOSIS — I82431 Acute embolism and thrombosis of right popliteal vein: Secondary | ICD-10-CM

## 2018-09-26 DIAGNOSIS — I739 Peripheral vascular disease, unspecified: Secondary | ICD-10-CM

## 2018-09-26 DIAGNOSIS — F17200 Nicotine dependence, unspecified, uncomplicated: Secondary | ICD-10-CM | POA: Diagnosis not present

## 2018-09-26 DIAGNOSIS — M79604 Pain in right leg: Secondary | ICD-10-CM | POA: Diagnosis not present

## 2018-09-26 NOTE — Progress Notes (Signed)
SUBJECTIVE:  Patient ID: Ronald Malone, male    DOB: 1955-08-16, 63 y.o.   MRN: [id number] Chief Complaint  Patient presents with   Follow-up    ultrasound    HPI  Ronald Malone is a 63 y.o. male that presents today with concerns for numbness and tingling of his bilateral lower extremities.  He states that the numbness and tingling is worse when he is laying down in bed as well as when he elevates his lower extremities in a recliner.  The patient also endorses claudication-like symptoms.  He states that they are worse in his hip area.  Recently the patient had a right lower extremity DVT with a pulmonary embolus.  He currently has no issues with that and his swelling is greatly controlled in his right lower extremity.  He denies any wounds or infections.  He denies any fevers, chills, nausea, vomiting or diarrhea.  He denies any chest pain or shortness of breath.  Today the patient underwent noninvasive studies which shows an ABI of 0.90 on his right and 1.11 on his left.  There are no comparison studies.  The right lower extremity has monophasic waveforms within the tibial arteries, the left lower extremity has monophasic within the anterior tibial and biphasic waveforms in the posterior tibial.  The patient has strong toe waveforms bilaterally.  Past Medical History:  Diagnosis Date   MI (myocardial infarction) ([place])    Pulmonary embolism (HCC)     Past Surgical History:  Procedure Laterality Date   CARDIAC CATHETERIZATION      Social History   Socioeconomic History   Marital status: Married    Spouse name: Not on file   Number of children: Not on file   Years of education: Not on file   Highest education level: Not on file  Occupational History   Not on file  Social Needs   Financial resource strain: Not on file   Food insecurity    Worry: Not on file    Inability: Not on file   Transportation needs    Medical: Not on file    Non-medical: Not on  file  Tobacco Use   Smoking status: Current Every Day Smoker   Smokeless tobacco: Never Used  Substance and Sexual Activity   Alcohol use: Yes   Drug use: Not on file   Sexual activity: Not on file  Lifestyle   Physical activity    Days per week: Not on file    Minutes per session: Not on file   Stress: Not on file  Relationships   Social connections    Talks on phone: Not on file    Gets together: Not on file    Attends religious service: Not on file    Active member of club or organization: Not on file    Attends meetings of clubs or organizations: Not on file    Relationship status: Not on file   Intimate partner violence    Fear of current or ex partner: Not on file    Emotionally abused: Not on file    Physically abused: Not on file    Forced sexual activity: Not on file  Other Topics Concern   Not on file  Social History Narrative   Not on file    Family History  Problem Relation Age of Onset   Parkinson's disease Mother    Dementia Father     No Known Allergies   Review of Systems   Review  of Systems: Negative Unless Checked Constitutional: [] Weight loss  [] Fever  [] Chills Cardiac: [] Chest pain   []  Atrial Fibrillation  [] Palpitations   [] Shortness of breath when laying flat   [] Shortness of breath with exertion. [] Shortness of breath at rest Vascular:  [] Pain in legs with walking   [] Pain in legs with standing [x] Pain in legs when laying flat   [x] Claudication    [x] Pain in feet when laying flat    [] History of DVT   [] Phlebitis   [x] Swelling in legs   [x] Varicose veins   [] Non-healing ulcers Pulmonary:   [] Uses home oxygen   [] Productive cough   [] Hemoptysis   [] Wheeze  [] COPD   [] Asthma Neurologic:  [] Dizziness   [] Seizures  [] Blackouts [] History of stroke   [] History of TIA  [] Aphasia   [] Temporary Blindness   [] Weakness or numbness in arm   [x] Weakness or numbness in leg Musculoskeletal:   [] Joint swelling   [] Joint pain   [] Low back pain  []   History of Knee Replacement [] Arthritis [] back Surgeries  []  Spinal Stenosis    Hematologic:  [] Easy bruising  [] Easy bleeding   [] Hypercoagulable state   [] Anemic Gastrointestinal:  [] Diarrhea   [] Vomiting  [] Gastroesophageal reflux/heartburn   [] Difficulty swallowing. [] Abdominal pain Genitourinary:  [] Chronic kidney disease   [] Difficult urination  [] Anuric   [] Blood in urine [] Frequent urination  [] Burning with urination   [] Hematuria Skin:  [] Rashes   [] Ulcers [] Wounds Psychological:  [] History of anxiety   []  History of major depression  []  Memory Difficulties      OBJECTIVE:   Physical Exam  BP 133/79    Pulse 63    Resp 16    Ht 6' (1.829 m)    Wt (!) 321 lb (145.6 kg)    BMI 43.54 kg/m   Gen: WD/WN, NAD Head: [place]/AT, No temporalis wasting.  Ear/Nose/Throat: Hearing grossly intact, nares w/o erythema or drainage Eyes: PER, EOMI, sclera nonicteric.  Neck: Supple, no masses.  No JVD.  Pulmonary:  Good air movement, no use of accessory muscles.  Cardiac: RRR Vascular:  1+ edema right lower extremity, scattered varicosities bilaterally Vessel Right Left  Radial Palpable Palpable  Dorsalis Pedis  trace palpable  trace palpable  Posterior Tibial  trace palpable  trace palpable   Gastrointestinal: soft, non-distended. No guarding/no peritoneal signs.  Musculoskeletal: M/S 5/5 throughout.  No deformity or atrophy.  Neurologic: Pain and light touch intact in extremities.  Symmetrical.  Speech is fluent. Motor exam as listed above. Psychiatric: Judgment intact, Mood & affect appropriate for pt's clinical situation. Dermatologic: No Venous rashes. No Ulcers Noted.  No changes consistent with cellulitis. Lymph : No Cervical lymphadenopathy, no lichenification or skin changes of chronic lymphedema.       ASSESSMENT AND PLAN:  1. Peripheral artery disease (HCC) Based on the patient's noninvasive studies it is likely that the patient has atherosclerotic disease changes within his  iliac arteries.  However, based on these noninvasive studies neurogenic claudication cannot fully be ruled out.  After discussing possible options with the patient, he will return at his convenience to undergo an aortoiliac as well as a bilateral lower extremity arterial duplex in order to determine whether further intervention will be necessary or if continued conservative measures will suffice. - VAS US AORTA/IVC/ILIACS; Future - VAS [place] LOWER EXTREMITY ARTERIAL DUPLEX; Future  2. Deep vein thrombosis (DVT) of popliteal vein of right lower extremity, unspecified chronicity (HCC) Swelling is under great control.  Patient advised to utilize medical grade 1  compression stockings to control any possible further edema, as well as to exercise as tolerated.  3. Tobacco use disorder Patient states that he quit smoking 12 days ago.  Abstinence is continued at this time.   Current Outpatient Medications on File Prior to Visit  Medication Sig Dispense Refill   atorvastatin (LIPITOR) 40 MG tablet Take 40 mg by mouth daily.     Eliquis DVT/PE Starter Pack (ELIQUIS STARTER PACK) 5 MG TABS Take as directed on package: start with two-5mg  tablets twice daily for 7 days. On day 8, switch to one-5mg  tablet twice daily. 1 each 0   nitroGLYCERIN (NITROSTAT) 0.4 MG SL tablet Place under the tongue.     senna-docusate (SENOKOT-S) 8.6-50 MG tablet Take 2 tablets by mouth 2 (two) times daily. 30 tablet 1   amLODipine (NORVASC) 5 MG tablet Take 1 tablet (5 mg total) by mouth daily. (Patient not taking: Reported on 09/26/2018) 30 tablet 0   No current facility-administered medications on file prior to visit.     There are no Patient Instructions on file for this visit. No follow-ups on file.   Kris Hartmann, NP  This note was completed with Sales executive.  Any errors are purely unintentional.

## 2018-10-05 ENCOUNTER — Other Ambulatory Visit: Payer: Self-pay

## 2018-10-05 ENCOUNTER — Ambulatory Visit (INDEPENDENT_AMBULATORY_CARE_PROVIDER_SITE_OTHER): Payer: Medicare Other | Admitting: Physician Assistant

## 2018-10-05 ENCOUNTER — Encounter: Payer: Self-pay | Admitting: Physician Assistant

## 2018-10-05 ENCOUNTER — Telehealth: Payer: Self-pay | Admitting: Physician Assistant

## 2018-10-05 DIAGNOSIS — Z955 Presence of coronary angioplasty implant and graft: Secondary | ICD-10-CM

## 2018-10-05 DIAGNOSIS — Z1211 Encounter for screening for malignant neoplasm of colon: Secondary | ICD-10-CM

## 2018-10-05 DIAGNOSIS — I82431 Acute embolism and thrombosis of right popliteal vein: Secondary | ICD-10-CM | POA: Diagnosis not present

## 2018-10-05 DIAGNOSIS — J439 Emphysema, unspecified: Secondary | ICD-10-CM | POA: Diagnosis not present

## 2018-10-05 DIAGNOSIS — K5909 Other constipation: Secondary | ICD-10-CM

## 2018-10-05 DIAGNOSIS — Z87891 Personal history of nicotine dependence: Secondary | ICD-10-CM

## 2018-10-05 DIAGNOSIS — I2699 Other pulmonary embolism without acute cor pulmonale: Secondary | ICD-10-CM

## 2018-10-05 DIAGNOSIS — I252 Old myocardial infarction: Secondary | ICD-10-CM | POA: Insufficient documentation

## 2018-10-05 DIAGNOSIS — I2510 Atherosclerotic heart disease of native coronary artery without angina pectoris: Secondary | ICD-10-CM | POA: Insufficient documentation

## 2018-10-05 DIAGNOSIS — R079 Chest pain, unspecified: Secondary | ICD-10-CM

## 2018-10-05 MED ORDER — NITROGLYCERIN 0.4 MG SL SUBL: 0.4000 mg | SUBLINGUAL_TABLET | SUBLINGUAL | 0 refills | Status: DC | PRN

## 2018-10-05 NOTE — Telephone Encounter (Signed)
Done. Order printed and faxed.

## 2018-10-05 NOTE — Telephone Encounter (Signed)
Placed cologuard order, can we please print out and fax? Thanks.

## 2018-10-05 NOTE — Patient Instructions (Signed)
Chronic Obstructive Pulmonary Disease °Chronic obstructive pulmonary disease (COPD) is a long-term (chronic) lung problem. When you have COPD, it is hard for air to get in and out of your lungs. Usually the condition gets worse over time, and your lungs will never return to normal. There are things you can do to keep yourself as healthy as possible. °· Your doctor may treat your condition with: °? Medicines. °? Oxygen. °? Lung surgery. °· Your doctor may also recommend: °? Rehabilitation. This includes steps to make your body work better. It may involve a team of specialists. °? Quitting smoking, if you smoke. °? Exercise and changes to your diet. °? Comfort measures (palliative care). °Follow these instructions at home: °Medicines °· Take over-the-counter and prescription medicines only as told by your doctor. °· Talk to your doctor before taking any cough or allergy medicines. You may need to avoid medicines that cause your lungs to be dry. °Lifestyle °· If you smoke, stop. Smoking makes the problem worse. If you need help quitting, ask your doctor. °· Avoid being around things that make your breathing worse. This may include smoke, chemicals, and fumes. °· Stay active, but remember to rest as well. °· Learn and use tips on how to relax. °· Make sure you get enough sleep. Most adults need at least 7 hours of sleep every night. °· Eat healthy foods. Eat smaller meals more often. Rest before meals. °Controlled breathing °Learn and use tips on how to control your breathing as told by your doctor. Try: °· Breathing in (inhaling) through your nose for 1 second. Then, pucker your lips and breath out (exhale) through your lips for 2 seconds. °· Putting one hand on your belly (abdomen). Breathe in slowly through your nose for 1 second. Your hand on your belly should move out. Pucker your lips and breathe out slowly through your lips. Your hand on your belly should move in as you breathe out. ° °Controlled coughing °Learn  and use controlled coughing to clear mucus from your lungs. Follow these steps: °1. Lean your head a little forward. °2. Breathe in deeply. °3. Try to hold your breath for 3 seconds. °4. Keep your mouth slightly open while coughing 2 times. °5. Spit any mucus out into a tissue. °6. Rest and do the steps again 1 or 2 times as needed. °General instructions °· Make sure you get all the shots (vaccines) that your doctor recommends. Ask your doctor about a flu shot and a pneumonia shot. °· Use oxygen therapy and pulmonary rehabilitation if told by your doctor. If you need home oxygen therapy, ask your doctor if you should buy a tool to measure your oxygen level (oximeter). °· Make a COPD action plan with your doctor. This helps you to know what to do if you feel worse than usual. °· Manage any other conditions you have as told by your doctor. °· Avoid going outside when it is very hot, cold, or humid. °· Avoid people who have a sickness you can catch (contagious). °· Keep all follow-up visits as told by your doctor. This is important. °Contact a doctor if: °· You cough up more mucus than usual. °· There is a change in the color or thickness of the mucus. °· It is harder to breathe than usual. °· Your breathing is faster than usual. °· You have trouble sleeping. °· You need to use your medicines more often than usual. °· You have trouble doing your normal activities such as getting dressed   or walking around the house. °Get help right away if: °· You have shortness of breath while resting. °· You have shortness of breath that stops you from: °? Being able to talk. °? Doing normal activities. °· Your chest hurts for longer than 5 minutes. °· Your skin color is more blue than usual. °· Your pulse oximeter shows that you have low oxygen for longer than 5 minutes. °· You have a fever. °· You feel too tired to breathe normally. °Summary °· Chronic obstructive pulmonary disease (COPD) is a long-term lung problem. °· The way your  lungs work will never return to normal. Usually the condition gets worse over time. There are things you can do to keep yourself as healthy as possible. °· Take over-the-counter and prescription medicines only as told by your doctor. °· If you smoke, stop. Smoking makes the problem worse. °This information is not intended to replace advice given to you by your health care provider. Make sure you discuss any questions you have with your health care provider. °Document Released: 08/18/2007 Document Revised: 02/11/2017 Document Reviewed: 04/05/2016 °Elsevier Patient Education © 2020 [employer] ° °

## 2018-10-05 NOTE — Progress Notes (Signed)
Patient: Ronald Malone, Male    DOB: 1955/08/16, 63 y.o.   MRN: [id number] Visit Date: 10/05/2018  Today's Provider: [name], PA-C   Chief Complaint  Patient presents with  . New Patient (Initial Visit)   Subjective:    Virtual Visit via Telephone Note  I connected with Ronald Malone on 10/05/18 at  2:00 PM EDT by telephone and verified that I am speaking with the correct person using two identifiers.   I discussed the limitations, risks, security and privacy concerns of performing an evaluation and management service by telephone and the availability of in person appointments. I also discussed with the patient that there may be a patient responsible charge related to this service. The patient expressed understanding and agreed to proceed.  Patient location: home Provider location: [place] office  Persons involved in the visit: patient, provider    New patient Establishing Care: Ronald Malone is a 63 y.o. male.  Living in [place] with wife of 25 years. Two children grown. Grandchildren - one, no relationship. Originally from [place], [employer]-retired. 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   Colon Cancer Screening: Never performed, no family history. Reports 1/3 of intestine, unsure which one, was removed when he was a young child. Has since had constipation and may have one bowel movement a week which is normal for him.   Emphysema: Tobacco Abuse: has quit smoking for 21 days. Smoked for 45 years 2 packs a day. Emphysema on CT scan 08/2018. Denies sob, wheezing, coughing. Reports he cannot walk a long time because he feels pain in his legs that resolved with rest and not because of breathing.  Aortic Athersclerosis, CAD, history of stent - has two cardiac stents placed five years ago. History of myocardial infarction while visiting [place], [place]. He had an angioplasty and stent with a PCI  and DES stent placed and was referred to Dr. Clayborn Bigness at [facility]. Echo 09/01/2018 showed EF 60-65% with mildly increased left ventricular wall thickness. He uses nitrostat for PRN chest pain and needs a refill on this.  DVT and pulmonary embolism: went to [facility] on 09/01/2018 with chest pain and was admitted. Placed on Lovenox, transitioned to coumadin. Then discharged on Eliquis. Was placed on amlodipine 5 mg during hospitalization which has since been discontinued due to normotensive BP. He remains on Eliquis and will so for the next 6 months. He is followed by Dr[facility] Lucky Cowboy [facility]. He has an upcoming imaging appointment with vascular surgery on 10/16/2018 to check on blood flow to his legs.  -----------------------------------------------------------   Review of Systems  Constitutional: Negative for appetite change, chills, fatigue and fever.  HENT: Positive for dental problem, ear discharge and hearing loss. Negative for congestion, ear pain, nosebleeds and trouble swallowing.   Eyes: Negative for pain and visual disturbance.  Respiratory: Positive for cough and choking. Negative for chest tightness and shortness of breath.   Cardiovascular: Positive for chest pain and leg swelling. Negative for palpitations.  Gastrointestinal: Positive for constipation. Negative for abdominal pain, blood in stool, diarrhea, nausea and vomiting.  Endocrine: Negative for polydipsia, polyphagia and polyuria.  Genitourinary: Negative for dysuria and flank pain.  Musculoskeletal: Negative for arthralgias, back pain, joint swelling, myalgias and neck stiffness.  Skin: Negative for color change, rash and wound.  Neurological: Positive for light-headedness. Negative for dizziness, tremors, seizures, speech difficulty, weakness and headaches.  Psychiatric/Behavioral: Negative  for behavioral problems, confusion, decreased concentration, dysphoric mood and sleep disturbance. The patient is not nervous/anxious.    All other systems reviewed and are negative.   Social History   Socioeconomic History  . Marital status: Married    Spouse name: Not on file  . Number of children: Not on file  . Years of education: Not on file  . Highest education level: Not on file  Occupational History  . Not on file  Social Needs  . Financial resource strain: Not on file  . Food insecurity    Worry: Not on file    Inability: Not on file  . Transportation needs    Medical: Not on file    Non-medical: Not on file  Tobacco Use  . Smoking status: Current Every Day Smoker  . Smokeless tobacco: Never Used  Substance and Sexual Activity  . Alcohol use: Yes  . Drug use: Not on file  . Sexual activity: Not on file  Lifestyle  . Physical activity    Days per week: Not on file    Minutes per session: Not on file  . Stress: Not on file  Relationships  . Social Herbalist on phone: Not on file    Gets together: Not on file    Attends religious service: Not on file    Active member of club or organization: Not on file    Attends meetings of clubs or organizations: Not on file    Relationship status: Not on file  . Intimate partner violence    Fear of current or ex partner: Not on file    Emotionally abused: Not on file    Physically abused: Not on file    Forced sexual activity: Not on file  Other Topics Concern  . Not on file  Social History Narrative  . Not on file    Past Medical History:  Diagnosis Date  . CHF (congestive heart failure) ([place])   . Clotting disorder ([place])   . MI (myocardial infarction) ([place])   . Pulmonary embolism [facility])      Patient Active Problem List   Diagnosis Date Noted  . Peripheral artery disease ([place]) 09/26/2018  . DVT (deep venous thrombosis) ([place]) 09/19/2018  . Tobacco use disorder 09/19/2018  . Pain in limb 09/19/2018  . Acute pulmonary embolism ([place]) 09/01/2018    Past Surgical History:  Procedure Laterality Date  . CARDIAC CATHETERIZATION       His family history includes Dementia in his father; Parkinson's disease in his mother.   Current Outpatient Medications:  .  amLODipine (NORVASC) 5 MG tablet, Take 1 tablet (5 mg total) by mouth daily. (Patient not taking: Reported on 09/26/2018), Disp: 30 tablet, Rfl: 0 .  atorvastatin (LIPITOR) 40 MG tablet, Take 40 mg by mouth daily., Disp: , Rfl:  .  Eliquis DVT/PE Starter Pack (ELIQUIS STARTER PACK) 5 MG TABS, Take as directed on package: start with two-5mg  tablets twice daily for 7 days. On day 8, switch to one-5mg  tablet twice daily., Disp: 1 each, Rfl: 0 .  nitroGLYCERIN (NITROSTAT) 0.4 MG SL tablet, Place under the tongue., Disp: , Rfl:  .  senna-docusate (SENOKOT-S) 8.6-50 MG tablet, Take 2 tablets by mouth 2 (two) times daily., Disp: 30 tablet, Rfl: 1  Patient Care Team: Paulene Floor as PCP - General (Physician Assistant)     Objective:    Vitals: There were no vitals taken for this visit.  Physical Exam  Activities  of Daily Living In your present state of health, do you have any difficulty performing the following activities: 10/05/2018 09/01/2018  Hearing? Y N  Vision? Y N  Difficulty concentrating or making decisions? N N  Walking or climbing stairs? N N  Dressing or bathing? N N  Doing errands, shopping? N N  Some recent data might be hidden    Fall Risk Assessment No flowsheet data found.   Depression Screen PHQ 2/9 Scores 10/05/2018  PHQ - 2 Score 0  PHQ- 9 Score 1    No flowsheet data found.     Assessment & Plan:     Reviewed patient's Family Medical History Reviewed and updated list of patient's medical providers Assessment of cognitive impairment was done Assessed patient's functional ability Established a written schedule for health screening [place] Completed and Reviewed  Exercise Activities and Dietary recommendations Goals   None      There is no immunization history on file for this patient.   Health Maintenance  Topic Date Due  . Hepatitis C Screening  [birth year]  . HIV Screening  06/06/1968  . TETANUS/TDAP  06/06/1972  . COLONOSCOPY  06/07/2003  . PNA vac Low Risk Adult (1 of 2 - PCV13) 06/07/2018  . INFLUENZA VACCINE  10/14/2018     Discussed health benefits of physical activity, and encouraged him to engage in regular exercise appropriate for his age and condition.    1. Deep vein thrombosis (DVT) of popliteal vein of right lower extremity, unspecified chronicity (HCC)  Occurred one month ago and he was admitted with subsequent PE. He is currently on eliquis and followed by Dr. Lucky Cowboy at [facility] vein and vascular.  2. Other acute pulmonary embolism without acute cor pulmonale ([place])  See above.  3. History of MI (myocardial infarction)  Happened in 2015, two stents placed. Followed by Dr. Clayborn Bigness at [facility] and on Lipitor currently.  4. H/O heart artery stent   5. Pulmonary emphysema, unspecified emphysema type ([place])  Present on most recent chest CT. Long time smoker, has quit for 21 days.   6. History of tobacco abuse   7. Chest pain, unspecified type  - nitroGLYCERIN (NITROSTAT) 0.4 MG SL tablet; Place 1 tablet (0.4 mg total) under the tongue every 5 (five) minutes as needed for chest pain.  Dispense: 90 tablet; Refill: 0  8. Chronic constipation  Reports he has history of surgery removing 1/3 of his intestine, unsure which portion of his intestine. Reports he has always had constipation and 1 bowl movement a week is normal for him.  9. Colon cancer screening  He is on Eliquis for DVT and pulmonary embolism in the past month. He has never been screened for colon cancer. Do not think he should undergo colonoscopy and come off his eliquis. Will have him do cologuard. Explained risks and benefits.  - Cologuard  The entirety of the information documented in the History of Present Illness, Review of Systems and Physical Exam were  personally obtained by me. Portions of this information were initially documented by Meyer Cory, CMA and reviewed by me for thoroughness and accuracy.   F/u 2 weeks for CPE   ------------------------------------------------------------------------------------------------------------    [name], PA-C  [facility]

## 2018-10-06 ENCOUNTER — Encounter: Payer: Self-pay | Admitting: Physician Assistant

## 2018-10-16 ENCOUNTER — Encounter (INDEPENDENT_AMBULATORY_CARE_PROVIDER_SITE_OTHER): Payer: Self-pay | Admitting: Nurse Practitioner

## 2018-10-16 ENCOUNTER — Ambulatory Visit (INDEPENDENT_AMBULATORY_CARE_PROVIDER_SITE_OTHER): Payer: Medicare Other

## 2018-10-16 ENCOUNTER — Ambulatory Visit (INDEPENDENT_AMBULATORY_CARE_PROVIDER_SITE_OTHER): Payer: Medicare Other | Admitting: Nurse Practitioner

## 2018-10-16 ENCOUNTER — Telehealth (INDEPENDENT_AMBULATORY_CARE_PROVIDER_SITE_OTHER): Payer: Self-pay

## 2018-10-16 ENCOUNTER — Other Ambulatory Visit: Payer: Self-pay

## 2018-10-16 VITALS — BP 179/97 | HR 71 | Resp 12 | Ht 72.0 in | Wt 326.0 lb

## 2018-10-16 DIAGNOSIS — J439 Emphysema, unspecified: Secondary | ICD-10-CM

## 2018-10-16 DIAGNOSIS — I739 Peripheral vascular disease, unspecified: Secondary | ICD-10-CM | POA: Diagnosis not present

## 2018-10-16 DIAGNOSIS — R200 Anesthesia of skin: Secondary | ICD-10-CM

## 2018-10-16 DIAGNOSIS — Z87891 Personal history of nicotine dependence: Secondary | ICD-10-CM

## 2018-10-16 NOTE — Progress Notes (Signed)
SUBJECTIVE:  Patient ID: Ronald Malone, male    DOB: 01/21/1956, 63 y.o.   MRN: [id number] Chief Complaint  Patient presents with  . Follow-up    HPI  Ronald Malone is a 63 y.o. male resents today for follow-up of his lower extremity pain.  The patient states that he has numbness whenever he lays down whether it is laying down in the bed or if it is his recliner.  He also endorses claudication-like pain whenever he is mowing his lawn and he states that most recently he began to have pain in his calves bilaterally with ambulation.  The patient is a former smoker and has abstained from smoking for approximately 32 days.  He continues to strive to maintain this abstinence.  He denies any fever, chills, nausea, vomiting or diarrhea.  He denies any chest pain or shortness of breath.  He denies any previous peripheral vascular interventions.  The patient most recently had a pulmonary embolism approximately a month or so ago.  He also has a previous history of an MI.  The patient previously underwent ABIs at a previous visit with an ABI of 0.90 on the right and 1.11 on his left however his tibial arteries had monophasic waveforms.  Today he underwent a duplex of his aortic iliac arteries which reveals monophasic waveforms within his right common iliac arteries as well as the external iliac artery.  The left common iliac artery and external iliac artery has biphasic waveforms.  The patient also underwent a bilateral lower extremity arterial duplex which reveals biphasic waveforms in the right lower extremity to the level of the tibial arteries which has monophasic waveforms.  The left lower extremity has triphasic waveforms down to the level of the anterior tibial artery which has monophasic waveforms the posterior tibial artery has triphasic waveforms.  Past Medical History:  Diagnosis Date  . CHF (congestive heart failure) ([name])   . Clotting disorder ([place])   . MI (myocardial infarction) ([place])    . Pulmonary embolism [facility])     Past Surgical History:  Procedure Laterality Date  . CARDIAC CATHETERIZATION      Social History   Socioeconomic History  . Marital status: Married    Spouse name: Not on file  . Number of children: Not on file  . Years of education: Not on file  . Highest education level: Not on file  Occupational History  . Not on file  Social Needs  . Financial resource strain: Not on file  . Food insecurity    Worry: Not on file    Inability: Not on file  . Transportation needs    Medical: Not on file    Non-medical: Not on file  Tobacco Use  . Smoking status: Former Smoker    Packs/day: 3.00    Years: 45.00    Pack years: 135.00    Types: Cigarettes    Quit date: 09/15/2018    Years since quitting: 0.0  . Smokeless tobacco: Never Used  Substance and Sexual Activity  . Alcohol use: Yes  . Drug use: Not on file  . Sexual activity: Not on file  Lifestyle  . Physical activity    Days per week: Not on file    Minutes per session: Not on file  . Stress: Not on file  Relationships  . Social Herbalist on phone: Not on file    Gets together: Not on file    Attends religious service: Not on  file    Active member of club or organization: Not on file    Attends meetings of clubs or organizations: Not on file    Relationship status: Not on file  . Intimate partner violence    Fear of current or ex partner: Not on file    Emotionally abused: Not on file    Physically abused: Not on file    Forced sexual activity: Not on file  Other Topics Concern  . Not on file  Social History Narrative  . Not on file    Family History  Problem Relation Age of Onset  . Parkinson's disease Mother   . Dementia Father     No Known Allergies   Review of Systems   Review of Systems: Negative Unless Checked Constitutional: [] Weight loss  [] Fever  [] Chills Cardiac: [] Chest pain   []  Atrial Fibrillation  [] Palpitations   [] Shortness of breath when  laying flat   [] Shortness of breath with exertion. [] Shortness of breath at rest Vascular:  [] Pain in legs with walking   [] Pain in legs with standing [] Pain in legs when laying flat   [x] Claudication    [] Pain in feet when laying flat    [] History of DVT   [] Phlebitis   [x] Swelling in legs   [] Varicose veins   [] Non-healing ulcers Pulmonary:   [] Uses home oxygen   [] Productive cough   [] Hemoptysis   [] Wheeze  [x] COPD   [] Asthma Neurologic:  [] Dizziness   [] Seizures  [] Blackouts [] History of stroke   [] History of TIA  [] Aphasia   [] Temporary Blindness   [] Weakness or numbness in arm   [x] Weakness or numbness in leg Musculoskeletal:   [] Joint swelling   [] Joint pain   [] Low back pain  []  History of Knee Replacement [] Arthritis [] back Surgeries  []  Spinal Stenosis    Hematologic:  [] Easy bruising  [] Easy bleeding   [] Hypercoagulable state   [] Anemic Gastrointestinal:  [] Diarrhea   [] Vomiting  [] Gastroesophageal reflux/heartburn   [] Difficulty swallowing. [] Abdominal pain Genitourinary:  [] Chronic kidney disease   [] Difficult urination  [] Anuric   [] Blood in urine [] Frequent urination  [] Burning with urination   [] Hematuria Skin:  [] Rashes   [] Ulcers [] Wounds Psychological:  [] History of anxiety   []  History of major depression  []  Memory Difficulties      OBJECTIVE:   Physical Exam  BP (!) 179/97 (BP Location: Left Arm, Patient Position: Sitting, Cuff Size: Large)   Pulse 71   Resp 12   Ht 6' (1.829 m)   Wt (!) 326 lb (147.9 kg)   BMI 44.21 kg/m   Gen: WD/WN, NAD Head: [place]/AT, No temporalis wasting.  Ear/Nose/Throat: Hearing grossly intact, nares w/o erythema or drainage Eyes: PER, EOMI, sclera nonicteric.  Neck: Supple, no masses.  No JVD.  Pulmonary:  Good air movement, no use of accessory muscles.  Cardiac: RRR Vascular:  Vessel Right Left  Radial Palpable Palpable  Dorsalis Pedis Palpable Palpable  Posterior Tibial Palpable Palpable   Gastrointestinal: soft, non-distended. No  guarding/no peritoneal signs.  Musculoskeletal: M/S 5/5 throughout.  No deformity or atrophy.  Neurologic: Pain and light touch intact in extremities.  Symmetrical.  Speech is fluent. Motor exam as listed above. Psychiatric: Judgment intact, Mood & affect appropriate for pt's clinical situation. Dermatologic: No Venous rashes. No Ulcers Noted.  No changes consistent with cellulitis. Lymph : No Cervical lymphadenopathy, no lichenification or skin changes of chronic lymphedema.       ASSESSMENT AND PLAN:  1. Peripheral artery disease ([place]) The  patient previously underwent ABIs at a previous visit with an ABI of 0.90 on the right and 1.11 on his left however his tibial arteries had monophasic waveforms.  Today he underwent a duplex of his aortic iliac arteries which reveals monophasic waveforms within his right common iliac arteries as well as the external iliac artery.  The left common iliac artery and external iliac artery has biphasic waveforms.  The patient also underwent a bilateral lower extremity arterial duplex which reveals biphasic waveforms in the right lower extremity to the level of the tibial arteries which has monophasic waveforms.  The left lower extremity has triphasic waveforms down to the level of the anterior tibial artery which has monophasic waveforms the posterior tibial artery has triphasic waveforms.  Recommend:  The patient has experienced increased symptoms and is now describing lifestyle limiting claudication and mild rest pain.   Given the severity of the patient's right lower extremity symptom the patient should undergo angiography and intervention.  Risk and benefits were reviewed the patient.  Indications for the procedure were reviewed.  All questions were answered, the patient agrees to proceed.   The patient should continue walking and begin a more formal exercise program.  The patient should continue antiplatelet therapy and aggressive treatment of the lipid  abnormalities  The patient will follow up with me after the angiogram.  2. History of tobacco abuse Patient continues to abstain from tobacco use.  Patient is heavily encouraged to continue with abstinence.  Advised that smoking will decrease the longevity of his interventions.  3. Bilateral leg numbness Based upon the fact that the patient's numbness only comes when he changes position, I feel that this is most likely related to issues within his back versus his peripheral arterial disease.  It is predictable with position changes.  I have suggested to the patient to follow-up with his primary care physician for further follow-up test in regards to his back to determine possible causes for his numbness. 4. Pulmonary emphysema, unspecified emphysema type ([place]) Continue pulmonary medications and aerosols as already ordered, these medications have been reviewed and there are no changes at this time.  Continue pulmonary medications and aerosols as already ordered, these medications have been reviewed and there are no changes at this time.     Current Outpatient Medications on File Prior to Visit  Medication Sig Dispense Refill  . atorvastatin (LIPITOR) 40 MG tablet Take 40 mg by mouth daily.    . Eliquis DVT/PE Starter Pack (ELIQUIS STARTER PACK) 5 MG TABS Take as directed on package: start with two-5mg  tablets twice daily for 7 days. On day 8, switch to one-5mg  tablet twice daily. 1 each 0  . nitroGLYCERIN (NITROSTAT) 0.4 MG SL tablet Place 1 tablet (0.4 mg total) under the tongue every 5 (five) minutes as needed for chest pain. 90 tablet 0  . senna-docusate (SENOKOT-S) 8.6-50 MG tablet Take 2 tablets by mouth 2 (two) times daily. (Patient not taking: Reported on 10/16/2018) 30 tablet 1   No current facility-administered medications on file prior to visit.     There are no Patient Instructions on file for this visit. No follow-ups on file.   Kris Hartmann, NP  This note was completed  with Sales executive.  Any errors are purely unintentional.

## 2018-10-16 NOTE — Telephone Encounter (Signed)
Spoke with the Ronald Malone and he is now scheduled with Dr. Lucky Cowboy for angio on 10/23/2018 with a 7:45 am arrival time at the [facility]. Ronald Malone will do his Covid test on 10/20/2018 with a 12:30-2:30 pm arrival time to the [facility]. All pre-procedure instructions were discussed. This will be mailed out to the Ronald Malone.

## 2018-10-17 ENCOUNTER — Encounter (INDEPENDENT_AMBULATORY_CARE_PROVIDER_SITE_OTHER): Payer: Self-pay

## 2018-10-17 ENCOUNTER — Other Ambulatory Visit (INDEPENDENT_AMBULATORY_CARE_PROVIDER_SITE_OTHER): Payer: Self-pay | Admitting: Nurse Practitioner

## 2018-10-17 NOTE — Telephone Encounter (Signed)
Patients Covid testing day/time has changed from 10/20/2018 to 10/19/2018 between 12:30-2:30 pm. This will be updated in the pre-procedure instructions mailed to the patient.

## 2018-10-18 ENCOUNTER — Other Ambulatory Visit: Payer: Self-pay

## 2018-10-18 ENCOUNTER — Encounter: Payer: Self-pay | Admitting: Physician Assistant

## 2018-10-18 ENCOUNTER — Ambulatory Visit (INDEPENDENT_AMBULATORY_CARE_PROVIDER_SITE_OTHER): Payer: Medicare Other | Admitting: Physician Assistant

## 2018-10-18 VITALS — BP 126/70 | HR 70 | Temp 97.9°F | Resp 18 | Wt 331.0 lb

## 2018-10-18 DIAGNOSIS — Z1159 Encounter for screening for other viral diseases: Secondary | ICD-10-CM

## 2018-10-18 DIAGNOSIS — Z13220 Encounter for screening for lipoid disorders: Secondary | ICD-10-CM

## 2018-10-18 DIAGNOSIS — E1165 Type 2 diabetes mellitus with hyperglycemia: Secondary | ICD-10-CM

## 2018-10-18 DIAGNOSIS — R29818 Other symptoms and signs involving the nervous system: Secondary | ICD-10-CM

## 2018-10-18 DIAGNOSIS — Z1211 Encounter for screening for malignant neoplasm of colon: Secondary | ICD-10-CM | POA: Diagnosis not present

## 2018-10-18 DIAGNOSIS — Z0000 Encounter for general adult medical examination without abnormal findings: Secondary | ICD-10-CM

## 2018-10-18 DIAGNOSIS — Z125 Encounter for screening for malignant neoplasm of prostate: Secondary | ICD-10-CM

## 2018-10-18 DIAGNOSIS — Z23 Encounter for immunization: Secondary | ICD-10-CM | POA: Diagnosis not present

## 2018-10-18 DIAGNOSIS — Z114 Encounter for screening for human immunodeficiency virus [HIV]: Secondary | ICD-10-CM | POA: Diagnosis not present

## 2018-10-18 DIAGNOSIS — Z131 Encounter for screening for diabetes mellitus: Secondary | ICD-10-CM | POA: Diagnosis not present

## 2018-10-18 NOTE — Progress Notes (Signed)
Patient: Ronald Malone, Male    DOB: March 16, 1955, 62 y.o.   MRN: [id number] Visit Date: 10/18/2018  Today's Provider: [name], PA-C   No chief complaint on file.  Subjective:    Initial preventative physical exam Ronald Malone is a 63 y.o. male who presents today for his Initial Preventative Physical Exam. He feels poorly. He reports exercising none. He reports he is sleeping poorly.  Colon Cancer screening: Never. Open to cologuard.  PSA: 10/19/2018 normal Pneumonia Vaccine: Due for prevnar.  Morbid Obesity: Patient wants to know why he has gained so much weight. He is eating the same amount as always. Recently he has gone through a PE and been hospitalized and has left his job, which involved walking.  Wt Readings from Last 3 Encounters:  10/23/18 (!) 330 lb 14.6 oz (150.1 kg)  10/18/18 (!) 331 lb (150.1 kg)  10/16/18 (!) 326 lb (147.9 kg)   Typical Diet:  Two pieces of toast with lots of butter, sausage egg and cheese Sausage links: 2 of them 4 eggs: 280 calories  Cheese two slices: 967  Toast: two 300 calroeis  Butter: two tablespoons 220   Snacks 1 dollar boxes thin mints and m&ms 1-2 cans of soda a day whatevers on the table  Obstructive sleep apnea: declines CPAP and sleep study  Review of Systems  All other systems reviewed and are negative.   Social History   Socioeconomic History  . Marital status: Married    Spouse name: Not on file  . Number of children: Not on file  . Years of education: Not on file  . Highest education level: Not on file  Occupational History  . Not on file  Social Needs  . Financial resource strain: Not on file  . Food insecurity    Worry: Not on file    Inability: Not on file  . Transportation needs    Medical: Not on file    Non-medical: Not on file  Tobacco Use  . Smoking status: Former Smoker    Packs/day: 3.00    Years: 45.00    Pack years: 135.00    Types: Cigarettes    Quit date: 09/15/2018   Years since quitting: 0.0  . Smokeless tobacco: Never Used  Substance and Sexual Activity  . Alcohol use: Yes  . Drug use: Not on file  . Sexual activity: Not on file  Lifestyle  . Physical activity    Days per week: Not on file    Minutes per session: Not on file  . Stress: Not on file  Relationships  . Social Herbalist on phone: Not on file    Gets together: Not on file    Attends religious service: Not on file    Active member of club or organization: Not on file    Attends meetings of clubs or organizations: Not on file    Relationship status: Not on file  . Intimate partner violence    Fear of current or ex partner: Not on file    Emotionally abused: Not on file    Physically abused: Not on file    Forced sexual activity: Not on file  Other Topics Concern  . Not on file  Social History Narrative  . Not on file    Past Medical History:  Diagnosis Date  . CHF (congestive heart failure) ([place])   . Clotting disorder ([place])   . MI (myocardial infarction) (  [place])   . Pulmonary embolism [facility])      Patient Active Problem List   Diagnosis Date Noted  . Bilateral leg numbness 10/16/2018  . History of MI (myocardial infarction) 10/05/2018  . Pulmonary emphysema ([place]) 10/05/2018  . H/O heart artery stent 10/05/2018  . Peripheral artery disease ([place]) 09/26/2018  . DVT (deep venous thrombosis) ([place]) 09/19/2018  . History of tobacco abuse 09/19/2018  . Pain in limb 09/19/2018  . Acute pulmonary embolism ([place]) 09/01/2018    Past Surgical History:  Procedure Laterality Date  . CARDIAC CATHETERIZATION      His family history includes Dementia in his father; Parkinson's disease in his mother.   Current Outpatient Medications:  .  atorvastatin (LIPITOR) 40 MG tablet, Take 40 mg by mouth daily., Disp: , Rfl:  .  Eliquis DVT/PE Starter Pack (ELIQUIS STARTER PACK) 5 MG TABS, Take as directed on package: start with two-5mg  tablets twice daily for 7 days. On day 8,  switch to one-5mg  tablet twice daily., Disp: 1 each, Rfl: 0 .  nitroGLYCERIN (NITROSTAT) 0.4 MG SL tablet, Place 1 tablet (0.4 mg total) under the tongue every 5 (five) minutes as needed for chest pain., Disp: 90 tablet, Rfl: 0 .  senna-docusate (SENOKOT-S) 8.6-50 MG tablet, Take 2 tablets by mouth 2 (two) times daily. (Patient not taking: Reported on 10/16/2018), Disp: 30 tablet, Rfl: 1   Patient Care Team: Paulene Floor as PCP - General (Physician Assistant)   Objective:    Vitals: BP 126/70 (BP Location: Left Arm, Patient Position: Sitting, Cuff Size: Large)   Pulse 70   Temp 97.9 F (36.6 C) (Oral)   Resp 18   Wt (!) 331 lb (150.1 kg)   SpO2 96%   BMI 44.89 kg/m   Physical Exam   No exam data present  Activities of Daily Living In your present state of health, do you have any difficulty performing the following activities: 10/18/2018 10/05/2018  Hearing? Tempie Donning  Vision? Y Y  Difficulty concentrating or making decisions? N N  Walking or climbing stairs? N N  Dressing or bathing? N N  Doing errands, shopping? N N  Some recent data might be hidden    Fall Risk Assessment Fall Risk  10/18/2018  Falls in the past year? 0  Number falls in past yr: 0     Depression Screen PHQ 2/9 Scores 10/18/2018 10/05/2018  PHQ - 2 Score 0 0  PHQ- 9 Score 1 1    No flowsheet data found.    Assessment & Plan:     Initial Preventative Physical Exam  Reviewed patient's Family Medical History Reviewed and updated list of patient's medical providers Assessment of cognitive impairment was done Assessed patient's functional ability Established a written schedule for health screening [place] Completed and Reviewed  Exercise Activities and Dietary recommendations Goals   None      There is no immunization history on file for this patient.  Health Maintenance  Topic Date Due  . Hepatitis C Screening  Dec 22, 1955  . HIV Screening  06/06/1968  .  TETANUS/TDAP  06/06/1972  . COLONOSCOPY  06/07/2003  . PNA vac Low Risk Adult (1 of 2 - PCV13) 06/07/2018  . INFLUENZA VACCINE  10/14/2018     Discussed health benefits of physical activity, and encouraged him to engage in regular exercise appropriate for his age and condition.    1. Annual physical exam   2. Colon cancer screening  - Cologuard  3. Prostate cancer screening  - PSA  4. Encounter for screening for HIV  - HIV antibody (with reflex)  5. Encounter for hepatitis C screening test for low risk patient  - Hepatitis c antibody (reflex)  6. Diabetes mellitus screening  - Comprehensive Metabolic Panel (CMET)  7. Screening cholesterol level  - Lipid Profile - Direct LDL  8. Morbid obesity ([place])  Counseled patient he is eating too much. His A1C shows diabetes and he will need to come back for visit to discuss this.   - HgB A1c  9. Suspected Sleep Apnea  Declines sleep study.   10. Need for vaccination against Streptococcus pneumoniae using pneumococcal conjugate vaccine 13  - Pneumococcal conjugate vaccine 13-valent  11. Type 2 diabetes mellitus with hyperglycemia, without long-term current use of insulin (HCC)  The entirety of the information documented in the History of Present Illness, Review of Systems and Physical Exam were personally obtained by me. Portions of this information were initially documented by Jennings Books, CMA and reviewed by me for thoroughness and accuracy.   F/u 1 week for DM II   -----------------------------------------------------------------------    [name], PA-C  [place]

## 2018-10-19 ENCOUNTER — Other Ambulatory Visit
Admission: RE | Admit: 2018-10-19 | Discharge: 2018-10-19 | Disposition: A | Discharge status: Home / Self Care | Payer: Medicare Other | Source: Ambulatory Visit | Attending: Vascular Surgery | Admitting: Vascular Surgery

## 2018-10-19 DIAGNOSIS — Z20828 Contact with and (suspected) exposure to other viral communicable diseases: Secondary | ICD-10-CM | POA: Insufficient documentation

## 2018-10-19 DIAGNOSIS — Z01812 Encounter for preprocedural laboratory examination: Secondary | ICD-10-CM | POA: Diagnosis not present

## 2018-10-19 LAB — COMPREHENSIVE METABOLIC PANEL
ALT: 20 IU/L (ref 0–44)
AST: 14 IU/L (ref 0–40)
Albumin/Globulin Ratio: 1.6 (ref 1.2–2.2)
Albumin: 4 g/dL (ref 3.8–4.8)
Alkaline Phosphatase: 94 IU/L (ref 39–117)
BUN/Creatinine Ratio: 27 — ABNORMAL HIGH (ref 10–24)
BUN: 24 mg/dL (ref 8–27)
Bilirubin Total: 0.5 mg/dL (ref 0.0–1.2)
CO2: 21 mmol/L (ref 20–29)
Calcium: 9.1 mg/dL (ref 8.6–10.2)
Chloride: 103 mmol/L (ref 96–106)
Creatinine, Ser: 0.9 mg/dL (ref 0.76–1.27)
GFR calc Af Amer: 103 mL/min/{1.73_m2} (ref >59)
GFR calc non Af Amer: 89 mL/min/{1.73_m2} (ref >59)
Globulin, Total: 2.5 g/dL (ref 1.5–4.5)
Glucose: 148 mg/dL — ABNORMAL HIGH (ref 65–99)
Potassium: 4.4 mmol/L (ref 3.5–5.2)
Sodium: 140 mmol/L (ref 134–144)
Total Protein: 6.5 g/dL (ref 6.0–8.5)

## 2018-10-19 LAB — HCV COMMENT:

## 2018-10-19 LAB — LDL CHOLESTEROL, DIRECT: LDL Direct: 71 mg/dL (ref 0–99)

## 2018-10-19 LAB — PSA: Prostate Specific Ag, Serum: 0.7 ng/mL (ref 0.0–4.0)

## 2018-10-19 LAB — HEPATITIS C ANTIBODY (REFLEX): HCV Ab: <0.1 s/co ratio (ref 0.0–0.9)

## 2018-10-19 LAB — LIPID PANEL
Chol/HDL Ratio: 3.4 ratio (ref 0.0–5.0)
Cholesterol, Total: 145 mg/dL (ref 100–199)
HDL: 43 mg/dL (ref >39)
LDL Calculated: 46 mg/dL (ref 0–99)
Triglycerides: 280 mg/dL — ABNORMAL HIGH (ref 0–149)
VLDL Cholesterol Cal: 56 mg/dL — ABNORMAL HIGH (ref 5–40)

## 2018-10-19 LAB — HEMOGLOBIN A1C
Est. average glucose Bld gHb Est-mCnc: 151 mg/dL
Hgb A1c MFr Bld: 6.9 % — ABNORMAL HIGH (ref 4.8–5.6)

## 2018-10-19 LAB — HIV ANTIBODY (ROUTINE TESTING W REFLEX): HIV Screen 4th Generation wRfx: NONREACTIVE

## 2018-10-19 LAB — SARS CORONAVIRUS 2 (TAT 6-24 HRS): SARS Coronavirus 2: NEGATIVE

## 2018-10-20 ENCOUNTER — Telehealth: Payer: Self-pay | Admitting: *Deleted

## 2018-10-20 NOTE — Telephone Encounter (Signed)
-----   Message from [name], [place] sent at 10/19/2018  1:53 PM EDT ----- Labs show diabetes. It is controlled for now. He has been scheduled for an upcoming annual wellness visit. I think this should be changed ot initial diabetes appointment. Please call in a meter and strips and lancets for patient and tell him to bring it with him on his next visit.

## 2018-10-20 NOTE — Telephone Encounter (Signed)
Patient was notified of results. Expressed understanding. Rx was sent to pharmacy.

## 2018-10-23 ENCOUNTER — Ambulatory Visit: Admit: 2018-10-23 | Payer: Medicare Other | Admitting: Vascular Surgery

## 2018-10-23 ENCOUNTER — Encounter
Admission: RE | Disposition: A | Discharge status: Home / Self Care | Payer: Self-pay | Source: Home / Self Care | Attending: Vascular Surgery

## 2018-10-23 ENCOUNTER — Other Ambulatory Visit: Payer: Self-pay

## 2018-10-23 ENCOUNTER — Ambulatory Visit
Admission: RE | Admit: 2018-10-23 | Discharge: 2018-10-23 | Disposition: A | Discharge status: Home / Self Care | Payer: Medicare Other | Attending: Vascular Surgery | Admitting: Vascular Surgery

## 2018-10-23 DIAGNOSIS — I739 Peripheral vascular disease, unspecified: Secondary | ICD-10-CM | POA: Diagnosis not present

## 2018-10-23 DIAGNOSIS — I509 Heart failure, unspecified: Secondary | ICD-10-CM | POA: Diagnosis not present

## 2018-10-23 DIAGNOSIS — Z7901 Long term (current) use of anticoagulants: Secondary | ICD-10-CM | POA: Insufficient documentation

## 2018-10-23 DIAGNOSIS — I70213 Atherosclerosis of native arteries of extremities with intermittent claudication, bilateral legs: Secondary | ICD-10-CM

## 2018-10-23 DIAGNOSIS — Z79899 Other long term (current) drug therapy: Secondary | ICD-10-CM | POA: Insufficient documentation

## 2018-10-23 DIAGNOSIS — J439 Emphysema, unspecified: Secondary | ICD-10-CM | POA: Insufficient documentation

## 2018-10-23 DIAGNOSIS — I70219 Atherosclerosis of native arteries of extremities with intermittent claudication, unspecified extremity: Secondary | ICD-10-CM

## 2018-10-23 DIAGNOSIS — Z86711 Personal history of pulmonary embolism: Secondary | ICD-10-CM | POA: Insufficient documentation

## 2018-10-23 DIAGNOSIS — R200 Anesthesia of skin: Secondary | ICD-10-CM | POA: Diagnosis not present

## 2018-10-23 DIAGNOSIS — I252 Old myocardial infarction: Secondary | ICD-10-CM | POA: Insufficient documentation

## 2018-10-23 DIAGNOSIS — Z87891 Personal history of nicotine dependence: Secondary | ICD-10-CM | POA: Diagnosis not present

## 2018-10-23 HISTORY — PX: LOWER EXTREMITY ANGIOGRAPHY: CATH118251

## 2018-10-23 LAB — CREATININE, SERUM
Creatinine, Ser: 0.84 mg/dL (ref 0.61–1.24)
GFR calc Af Amer: >60 mL/min (ref >60)
GFR calc non Af Amer: >60 mL/min (ref >60)

## 2018-10-23 LAB — BUN: BUN: 19 mg/dL (ref 8–23)

## 2018-10-23 SURGERY — LOWER EXTREMITY ANGIOGRAPHY
Anesthesia: Moderate Sedation | Site: Leg Lower | Laterality: Right

## 2018-10-23 SURGERY — LOWER EXTREMITY ANGIOGRAPHY
Anesthesia: Moderate Sedation | Laterality: Right

## 2018-10-23 MED ORDER — HEPARIN SODIUM (PORCINE) 1000 UNIT/ML IJ SOLN
INTRAMUSCULAR | Status: AC
  Filled 2018-10-23: qty 1

## 2018-10-23 MED ORDER — MIDAZOLAM HCL 5 MG/5ML IJ SOLN
INTRAMUSCULAR | Status: AC
  Filled 2018-10-23: qty 5

## 2018-10-23 MED ORDER — SODIUM CHLORIDE 0.9 % IV SOLN: INTRAVENOUS | Status: DC

## 2018-10-23 MED ORDER — METHYLPREDNISOLONE SODIUM SUCC 125 MG IJ SOLR: 125.0000 mg | Freq: Once | INTRAMUSCULAR | Status: DC | PRN

## 2018-10-23 MED ORDER — FAMOTIDINE 20 MG PO TABS: 40.0000 mg | ORAL_TABLET | Freq: Once | ORAL | Status: DC | PRN

## 2018-10-23 MED ORDER — HYDROMORPHONE HCL 1 MG/ML IJ SOLN: 1.0000 mg | Freq: Once | INTRAMUSCULAR | Status: DC | PRN

## 2018-10-23 MED ORDER — MIDAZOLAM HCL 2 MG/ML PO SYRP: 8.0000 mg | ORAL_SOLUTION | Freq: Once | ORAL | Status: DC | PRN

## 2018-10-23 MED ORDER — MIDAZOLAM HCL 2 MG/2ML IJ SOLN
INTRAMUSCULAR | Status: DC | PRN
  Administered 2018-10-23: 2 mg via INTRAVENOUS
  Administered 2018-10-23: 1 mg via INTRAVENOUS

## 2018-10-23 MED ORDER — CEFAZOLIN SODIUM-DEXTROSE 2-4 GM/100ML-% IV SOLN
2.0000 g | Freq: Once | INTRAVENOUS | Status: AC
  Administered 2018-10-23: 2 g via INTRAVENOUS

## 2018-10-23 MED ORDER — ONDANSETRON HCL 4 MG/2ML IJ SOLN: 4.0000 mg | Freq: Four times a day (QID) | INTRAMUSCULAR | Status: DC | PRN

## 2018-10-23 MED ORDER — DIPHENHYDRAMINE HCL 50 MG/ML IJ SOLN: 50.0000 mg | Freq: Once | INTRAMUSCULAR | Status: DC | PRN

## 2018-10-23 MED ORDER — FENTANYL CITRATE (PF) 100 MCG/2ML IJ SOLN
INTRAMUSCULAR | Status: AC
  Filled 2018-10-23: qty 2

## 2018-10-23 MED ORDER — FENTANYL CITRATE (PF) 100 MCG/2ML IJ SOLN
INTRAMUSCULAR | Status: DC | PRN
  Administered 2018-10-23: 25 ug via INTRAVENOUS
  Administered 2018-10-23: 50 ug via INTRAVENOUS

## 2018-10-23 SURGICAL SUPPLY — 7 items
CATH PIG 70CM (CATHETERS) ×2 IMPLANT
DEVICE STARCLOSE SE CLOSURE (Vascular Products) ×2 IMPLANT
PACK ANGIOGRAPHY (CUSTOM PROCEDURE TRAY) ×2 IMPLANT
SHEATH BRITE TIP 5FRX11 (SHEATH) ×2 IMPLANT
SYR MEDRAD MARK 7 150ML (SYRINGE) ×2 IMPLANT
TUBING CONTRAST HIGH PRESS 72 (TUBING) ×2 IMPLANT
WIRE J 3MM .035X145CM (WIRE) ×2 IMPLANT

## 2018-10-23 NOTE — Op Note (Signed)
[facility]  Percutaneous Study/Intervention Procedural Note   Date of Surgery: 10/23/2018  Surgeon(s):Emryn Flanery    Assistants:none  Pre-operative Diagnosis: PAD with claudication bilateral lower extremity  Post-operative diagnosis:  PAD with symptoms likely neuropathic with only mild PAD seen.  Procedure(s) Performed:             1.  Ultrasound guidance for vascular access left femoral artery             2.  Catheter placement into right SFA from left femoral approach             3.  Aortogram and selective bilateral lower extremity angiograms             4.  StarClose closure device left femoral artery  EBL: 5 cc  Contrast: 100 cc  Fluoro Time: 4.6 minutes  Moderate Conscious Sedation Time: approximately 20 minutes using 3 mg of Versed and 75 Mcg of Fentanyl              Indications:  Patient is a 63 y.o.male with hip and buttock claudication as well as neuropathic symptoms in the feet and ankles.. The patient has noninvasive study showing relatively normal ABIs but the waveforms were significantly abnormal and monophasic. The patient is brought in for angiography for further evaluation and potential treatment.  Risks and benefits are discussed and informed consent is obtained.   Procedure:  The patient was identified and appropriate procedural time out was performed.  The patient was then placed supine on the table and prepped and draped in the usual sterile fashion. Moderate conscious sedation was administered during a face to face encounter with the patient throughout the procedure with my supervision of the RN administering medicines and monitoring the patient's vital signs, pulse oximetry, telemetry and mental status throughout from the start of the procedure until the patient was taken to the recovery room. Ultrasound was used to evaluate the left common femoral artery.  It was patent .  A digital ultrasound image was acquired.  A Seldinger needle was  used to access the left common femoral artery under direct ultrasound guidance and a permanent image was performed.  A 0.035 J wire was advanced without resistance and a 5Fr sheath was placed.  Pigtail catheter was placed into the aorta and an AP aortogram was performed.  Image quality was fairly poor due to his large body habitus.  This demonstrated normal renal arteries and normal aorta and iliac segments without significant stenosis. I then crossed the aortic bifurcation and advanced to the right femoral head.  For distal imaging, the pigtail catheter was advanced into the mid SFA to opacify distally.  Selective right lower extremity angiogram was then performed. This demonstrated normal common femoral artery, profunda femoris artery, and mildly diseased SFA near Hunter's canal in the 20 to 30% range.  There was then a normal tibial trifurcation and two-vessel runoff distally.  No intervention would be of benefit in the aortoiliac arteries with the right leg.  I elected to image the left leg to evaluate his symptoms there as well.  Imaging was performed to the left femoral sheath.  This demonstrated normal common femoral artery although the bifurcation was fairly high, normal profunda femoris artery, and minimally diseased superficial femoral and popliteal arteries.  There was a typical tibial trifurcation with two-vessel runoff distally although the distal anterior tibial artery in the foot and ankle was heavily diseased.  The posterior tibial artery was the dominant runoff  into the foot. I elected to terminate the procedure. The sheath was removed and StarClose closure device was deployed in the left femoral artery with excellent hemostatic result. The patient was taken to the recovery room in stable condition having tolerated the procedure well.  Findings:               Aortogram:  No apparent renal artery stenosis, aorta and iliac arteries mildly calcific and there was mild stenosis in the iliac arteries  but nothing more than about 20%.             Right lower Extremity:  This demonstrated normal common femoral artery, profunda femoris artery, and mildly diseased SFA near Hunter's canal in the 20 to 30% range.  There was then a normal tibial trifurcation and two-vessel runoff distally  Left lower extremity: Normal common femoral artery although the bifurcation was fairly high, normal profunda femoris artery, and minimally diseased superficial femoral and popliteal arteries.  There was a typical tibial trifurcation with two-vessel runoff distally although the distal anterior tibial artery in the foot and ankle was heavily diseased.  The posterior tibial artery was the dominant runoff into the foot.   Disposition: Patient was taken to the recovery room in stable condition having tolerated the procedure well.  Complications: None  Leotis Pain 10/23/2018 10:30 AM   This note was created with Dragon Medical transcription system. Any errors in dictation are purely unintentional.

## 2018-10-23 NOTE — Progress Notes (Signed)
Dr. Lucky Cowboy at bedside speaking with pt. Re: procedural results and plan of care. Pt. Verbalizes understanding of conversation.

## 2018-10-23 NOTE — H&P (Signed)
[facility] History & Physical Update  The patient was interviewed and re-examined.  The patient's previous History and Physical has been reviewed and is unchanged.  There is no change in the plan of care. We plan to proceed with the scheduled procedure.  Leotis Pain, MD  10/23/2018, 7:55 AM

## 2018-10-30 NOTE — Patient Instructions (Signed)

## 2018-11-04 ENCOUNTER — Other Ambulatory Visit: Payer: Self-pay | Admitting: Physician Assistant

## 2018-11-04 DIAGNOSIS — R079 Chest pain, unspecified: Secondary | ICD-10-CM

## 2018-11-08 ENCOUNTER — Other Ambulatory Visit: Payer: Self-pay

## 2018-11-08 DIAGNOSIS — Z20822 Contact with and (suspected) exposure to covid-19: Secondary | ICD-10-CM

## 2018-11-09 ENCOUNTER — Ambulatory Visit: Payer: Self-pay | Admitting: Family Medicine

## 2018-11-09 LAB — NOVEL CORONAVIRUS, NAA: SARS-CoV-2, NAA: NOT DETECTED

## 2018-11-10 ENCOUNTER — Telehealth: Payer: Self-pay

## 2018-11-10 MED ORDER — BLOOD GLUCOSE METER KIT: PACK | 0 refills | Status: DC

## 2018-11-10 NOTE — Telephone Encounter (Signed)
Patient called office stating that he has an appointment on 11/14/18 and reports that he was told by nurse to bring in a "diabetic kit" at time of appointment. Patient denies having a active or past medical diagnosis of diabetes and states that he does not check his blood sugar. Patient is asking if PA will be sending this diathetic kit to him prior to appt? KW

## 2018-11-10 NOTE — Telephone Encounter (Signed)
He was told on 10/20/2018 that he had diabetes based on his recent bloodwork. Please see phone note. Rx was sent into pharmacy at that time. Please call pharmacy to confirm receipt. If not, please give verbal order for glucometer, meter, and strips to check fasting sugar once daily.

## 2018-11-10 NOTE — Addendum Note (Signed)
Addended by: Julieta Bellini on: 11/10/2018 02:49 PM   Modules accepted: Orders

## 2018-11-10 NOTE — Telephone Encounter (Signed)
RX was re-faxed to pharmacy for meter, strips, and lancets.

## 2018-11-14 ENCOUNTER — Ambulatory Visit (INDEPENDENT_AMBULATORY_CARE_PROVIDER_SITE_OTHER): Payer: Medicare Other | Admitting: Nurse Practitioner

## 2018-11-14 ENCOUNTER — Other Ambulatory Visit: Payer: Self-pay

## 2018-11-14 ENCOUNTER — Ambulatory Visit (INDEPENDENT_AMBULATORY_CARE_PROVIDER_SITE_OTHER): Payer: Medicare Other | Admitting: Physician Assistant

## 2018-11-14 ENCOUNTER — Encounter: Payer: Self-pay | Admitting: Physician Assistant

## 2018-11-14 ENCOUNTER — Encounter (INDEPENDENT_AMBULATORY_CARE_PROVIDER_SITE_OTHER): Payer: Self-pay | Admitting: Nurse Practitioner

## 2018-11-14 VITALS — BP 147/85 | HR 71 | Temp 96.9°F | Wt 329.0 lb

## 2018-11-14 VITALS — BP 161/89 | HR 87 | Resp 14 | Ht 72.0 in | Wt 330.0 lb

## 2018-11-14 DIAGNOSIS — I739 Peripheral vascular disease, unspecified: Secondary | ICD-10-CM | POA: Diagnosis not present

## 2018-11-14 DIAGNOSIS — R200 Anesthesia of skin: Secondary | ICD-10-CM | POA: Diagnosis not present

## 2018-11-14 DIAGNOSIS — I2699 Other pulmonary embolism without acute cor pulmonale: Secondary | ICD-10-CM | POA: Diagnosis not present

## 2018-11-14 DIAGNOSIS — E119 Type 2 diabetes mellitus without complications: Secondary | ICD-10-CM | POA: Diagnosis not present

## 2018-11-14 DIAGNOSIS — I219 Acute myocardial infarction, unspecified: Secondary | ICD-10-CM

## 2018-11-14 DIAGNOSIS — G629 Polyneuropathy, unspecified: Secondary | ICD-10-CM | POA: Diagnosis not present

## 2018-11-14 DIAGNOSIS — L0390 Cellulitis, unspecified: Secondary | ICD-10-CM | POA: Diagnosis not present

## 2018-11-14 LAB — POCT UA - MICROALBUMIN: Microalbumin Ur, POC: 20 mg/L

## 2018-11-14 MED ORDER — CEPHALEXIN 500 MG PO CAPS: 500.0000 mg | ORAL_CAPSULE | Freq: Two times a day (BID) | ORAL | 0 refills | Status: AC

## 2018-11-14 NOTE — Progress Notes (Signed)
Patient: Ronald Malone Male    DOB: 20-Oct-1955   63 y.o.   MRN: [id number] Visit Date: 11/14/2018  Today's Provider: [name], PA-C   Chief Complaint  Patient presents with  . Diabetes   Subjective:    New diagnosis of diabetes. Prior to this he had not been under the care of a PCP and does not know what his sugars were. He is not on an ACEi. He is on a statin. He has restarted smoking after quitting.   Lab Results  Component Value Date   HGBA1C 6.9 (H) 10/18/2018   Glucometer was called into [facility] on 10/20/2018. Patient reported pharmacy did not receive this and this was again sent in on 11/10/2018. Patient has not contacted pharmacy since.   Diabetes He presents for his initial diabetic visit. He has type 2 diabetes mellitus. There are no hypoglycemic associated symptoms. Pertinent negatives for hypoglycemia include no dizziness or headaches. Associated symptoms include foot paresthesias. Pertinent negatives for diabetes include no blurred vision, no foot ulcerations, no polydipsia, no polyphagia, no polyuria and no visual change. His weight is stable.   Peripheral Neuropathy: Patient has had numbness in his feet for two years. He denies back injuries, claudication. He denies history of ruptured disc.   He has a history of DVT and PE in June 2020. He is morbidly obese and smokes but otherwise does not have identifiable risk factors. This is his first blood clot.   He has some spreading redness and warmth on his right lower leg x several weeks. He had a fever one week ago but this resolved and he tested negative for COVID. He denies pain in his leg. He does have some pain in his back after standing for long periods.    Lab Results  Component Value Date   HGBA1C 6.9 (H) 10/18/2018   Wt Readings from Last 3 Encounters:  11/14/18 (!) 329 lb (149.2 kg)  11/14/18 (!) 330 lb (149.7 kg)  10/23/18 (!) 330 lb 14.6 oz (150.1 kg)   BP Readings from Last 3 Encounters:   11/14/18 (!) 147/85  11/14/18 (!) 161/89  10/23/18 (!) 149/78      No Known Allergies   Current Outpatient Medications:  .  apixaban (ELIQUIS) 5 MG TABS tablet, Take 5 mg by mouth 2 (two) times daily., Disp: , Rfl:  .  atorvastatin (LIPITOR) 40 MG tablet, Take 40 mg by mouth daily., Disp: , Rfl:  .  nitroGLYCERIN (NITROSTAT) 0.4 MG SL tablet, PLACE 1 TABLET (0.4 MG TOTAL) UNDER THE TONGUE EVERY 5 (FIVE) MINUTES AS NEEDED FOR CHEST PAIN., Disp: 100 tablet, Rfl: 0 .  blood glucose meter kit and supplies, Dispense based on patient and insurance preference. Use once daily in the morning. (FOR ICD-10 E11.9). (Patient not taking: Reported on 11/14/2018), Disp: 1 each, Rfl: 0 .  cephALEXin (KEFLEX) 500 MG capsule, Take 1 capsule (500 mg total) by mouth 2 (two) times daily for 7 days., Disp: 14 capsule, Rfl: 0  Review of Systems  Constitutional: Negative.   Eyes: Negative for blurred vision.  Respiratory: Positive for shortness of breath. Negative for apnea, cough, choking, chest tightness, wheezing and stridor.   Cardiovascular: Negative.   Gastrointestinal: Negative.   Endocrine: Negative.  Negative for polydipsia, polyphagia and polyuria.  Neurological: Positive for numbness. Negative for dizziness, light-headedness and headaches.    Social History   Tobacco Use  . Smoking status: Current Every Day Smoker  Packs/day: 3.00    Years: 45.00    Pack years: 135.00    Types: Cigarettes    Last attempt to quit: 09/15/2018    Years since quitting: 0.1  . Smokeless tobacco: Never Used  Substance Use Topics  . Alcohol use: Yes      Objective:   BP (!) 147/85 (BP Location: Right Arm, Patient Position: Sitting, Cuff Size: Large)   Pulse 71   Temp (!) 96.9 F (36.1 C)   Wt (!) 329 lb (149.2 kg)   BMI 44.62 kg/m  Vitals:   11/14/18 1356  BP: (!) 147/85  Pulse: 71  Temp: (!) 96.9 F (36.1 C)  Weight: (!) 329 lb (149.2 kg)  Body mass index is 44.62 kg/m.   Physical Exam  Constitutional:      Appearance: Normal appearance. He is obese.  Cardiovascular:     Rate and Rhythm: Normal rate and regular rhythm.     Heart sounds: Normal heart sounds.  Pulmonary:     Effort: Pulmonary effort is normal.     Breath sounds: Normal breath sounds.  Musculoskeletal:       Legs:  Skin:    General: Skin is warm and dry.     Findings: Rash present.  Neurological:     Mental Status: He is alert and oriented to person, place, and time. Mental status is at baseline.  Psychiatric:        Mood and Affect: Mood normal.        Behavior: Behavior normal.      Results for orders placed or performed in visit on 11/14/18  POCT UA - Microalbumin  Result Value Ref Range   Microalbumin Ur, POC 20 mg/L       Assessment & Plan    1. Diabetes mellitus without complication ([place])  Counseled meter has been sent in. Counseled that he will need to lose weight and adhere to diabetic diet. Will refer to CCM for this. Advised about yearly eye exam. Urine micro normal today. F/u 3 months.  - Urine Microalbumin w/creat. ratio - Ambulatory referral to Ophthalmology - Ambulatory referral to Chronic Care Management Services - POCT UA - Microalbumin  2. Peripheral polyneuropathy  Likely from DM, possibly combination of DM and nerve compression from lumbar radiculopathy. Counseled this is likely not reversible no matter the cause though I can refer him to orthopedics. Declines for right now.   3. Cellulitis, unspecified cellulitis site  Some patchy redness on anterior right lower extremity. DDx: thrombophlebitis vs. Venous stasis vs cellulitis.   - cephALEXin (KEFLEX) 500 MG capsule; Take 1 capsule (500 mg total) by mouth 2 (two) times daily for 7 days.  Dispense: 14 capsule; Refill: 0  4. Myocardial infarction, unspecified MI type, unspecified artery ([place])  2015, currently on statin. BP doesn't tolerate ACEi or beta blocker.   5. Pulmonary Embolism [facility])  Ref oncology.    The entirety of the information documented in the History of Present Illness, Review of Systems and Physical Exam were personally obtained by me. Portions of this information were initially documented by Ashley Royalty, CMA and reviewed by me for thoroughness and accuracy.        [name], PA-C  [facility]

## 2018-11-14 NOTE — Progress Notes (Signed)
SUBJECTIVE:  Patient ID: Ronald Malone, male    DOB: [birth year], 63 y.o.   MRN: [id number] Chief Complaint  Patient presents with  . Follow-up    HPI  Ronald Malone is a 63 y.o. male that presents today following right lower extremity angiogram on 10/23/2018.  Previous noninvasive studies showed normal ABIs however he had monophasic waveforms within his iliac arteries.  However angiogram found that the patient had mild stenosis within the 20-30 range.  We will left lower extremity does find some disease within the distal left anterior tibial artery.  However based on this no intervention was done.  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  The patient does endorse that he has some history of back issues with a compressed disks.  Currently the patient is tolerating anticoagulation well however he is anxious to stop.  He denies any fever, chills, nausea, or diarrhea.  He denies any chest pain or shortness of breath.  Past Medical History:  Diagnosis Date  . CHF (congestive heart failure) ([place])   . Clotting disorder ([place])   . MI (myocardial infarction) ([place])   . Pulmonary embolism [facility])     Past Surgical History:  Procedure Laterality Date  . CARDIAC CATHETERIZATION    . COLON SURGERY     1/3 colon removed as an infant  . LOWER EXTREMITY ANGIOGRAPHY Right 10/23/2018   Procedure: LOWER EXTREMITY ANGIOGRAPHY;  Surgeon: Algernon Huxley, MD;  Location: [facility];  Service: Cardiovascular;  Laterality: Right;    Social History   Socioeconomic History  . Marital status: Married    Spouse name: [name]   . Number of children: 2  . Years of education: Not on file  . Highest education level: Not on file  Occupational History  . Occupation: RETIRED   Social Needs  . Financial resource strain: Not hard at all  . Food insecurity    Worry: Never true    Inability: Never true  .  Transportation needs    Medical: No    Non-medical: No  Tobacco Use  . Smoking status: Former Smoker    Packs/day: 3.00    Years: 45.00    Pack years: 135.00    Types: Cigarettes    Quit date: 09/15/2018    Years since quitting: 0.1  . Smokeless tobacco: Never Used  Substance and Sexual Activity  . Alcohol use: Yes  . Drug use: Not on file  . Sexual activity: Not on file  Lifestyle  . Physical activity    Days per week: Not on file    Minutes per session: Not on file  . Stress: Only a little  Relationships  . Social connections    Talks on phone: More than three times a week    Gets together: Not on file    Attends religious service: Not on file    Active member of club or organization: Not on file    Attends meetings of clubs or organizations: Not on file    Relationship status: Not on file  . Intimate partner violence    Fear of current or ex partner: Not on file    Emotionally abused: No    Physically abused: No    Forced sexual activity: No  Other Topics Concern  . Not on file  Social History Narrative  . Not on file    Family History  Problem Relation Age  of Onset  . Parkinson's disease Mother   . Dementia Father     No Known Allergies   Review of Systems   Review of Systems: Negative Unless Checked Constitutional: _0 Weight loss  _1 Fever  _2 Chills Cardiac: _3 Chest pain   _4  Atrial Fibrillation  _5 Palpitations   _6 Shortness of breath when laying flat   _7 Shortness of breath with exertion. _8 Shortness of breath at rest Vascular:  _9 Pain in legs with walking   _10 Pain in legs with standing _11 Pain in legs when laying flat   _12 Claudication    _13 Pain in feet when laying flat    _14 History of DVT   _15 Phlebitis   _16 Swelling in legs   _17 Varicose veins   _18 Non-healing ulcers Pulmonary:   _19 Uses home oxygen   _20 Productive cough   _21 Hemoptysis   _22 Wheeze  _23 COPD   _24 Asthma Neurologic:  _25 Dizziness   _26 Seizures  _27 Blackouts _28 History of stroke   _29 History of TIA   _30 Aphasia   _31 Temporary Blindness   _32 Weakness or numbness in arm   _33 Weakness or numbness in leg Musculoskeletal:   _34 Joint swelling   _35 Joint pain   _36 Low back pain  _37  History of Knee Replacement _38 Arthritis _39 back Surgeries  _40  Spinal Stenosis    Hematologic:  _41 Easy bruising  _42 Easy bleeding   _43 Hypercoagulable state   _44 Anemic Gastrointestinal:  _45 Diarrhea   _46 Vomiting  _47 Gastroesophageal reflux/heartburn   _48 Difficulty swallowing. _49 Abdominal pain Genitourinary:  _50 Chronic kidney disease   _51 Difficult urination  _52 Anuric   _53 Blood in urine _54 Frequent urination  _55 Burning with urination   _56 Hematuria Skin:  _57 Rashes   _58 Ulcers _59 Wounds Psychological:  _60 History of anxiety   _61  History of major depression  _62  Memory Difficulties      OBJECTIVE:   Physical Exam  BP (!) 161/89 (BP Location: Right Wrist, Patient Position: Sitting, Cuff Size: Large)   Pulse 87   Resp 14   Ht 6' (1.829 m)   Wt (!) 330 lb (149.7 kg)   BMI 44.76 kg/m   Gen: WD/WN, NAD Head: [place]/AT, No temporalis wasting.  Ear/Nose/Throat: Hearing grossly intact, nares w/o erythema or drainage Eyes: PER, EOMI, sclera nonicteric.  Neck: Supple, no masses.  No JVD.  Pulmonary:  Good air movement, no use of accessory muscles.  Cardiac: RRR Vascular:  +2 edema stasis dermatitis Vessel Right Left  Radial Palpable Palpable   Gastrointestinal: soft, non-distended. No guarding/no peritoneal signs.  Musculoskeletal: M/S 5/5 throughout.  No deformity or atrophy.  Neurologic: Pain and light touch intact in extremities.  Symmetrical.  Speech is fluent. Motor exam as listed above. Psychiatric: Judgment intact, Mood & affect appropriate for pt's clinical situation. Dermatologic:  Left lower extremity stasis dermatitis no Ulcers Noted.  No changes consistent with cellulitis. Lymph : No Cervical lymphadenopathy, no lichenification or skin changes of chronic lymphedema.       ASSESSMENT AND PLAN:  1. Peripheral artery disease  ([place]) Based upon the angiogram the patient does have some evidence of atherosclerotic changes.  We will continue to follow-up for peripheral artery disease.  Based on his last invasive studies as well as the angiogram, annual follow-up will be adequate. - VAS [place] ABI WITH/WO TBI; Future  2. Other acute pulmonary embolism without acute cor pulmonale (HCC) Currently the patient is being anticoagulated with Eliquis.  The patient is anxious to stop anticoagulation.  I discussed the natural progression of DVTs as well as pulmonary embolism with the patient.  Generally for a patient with a non-complicated DVT we would put them on anticoagulation for  at least 3 months, however in the patient's case he had a pulmonary embolism as well.  Based on this he should be on anticoagulations for at least 6 months to a year.  The patient is also anxious to know whether or not the clots have dissolved.  I have advised him to follow-up with his PCP in order to determine the exact length of anticoagulation and as to whether or not further testing will be ordered to assess continued clot burden, if any  3. Bilateral leg numbness Recent angiogram rules out any arterial cause to the patient's claudication or numbness.  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  The patient has been told that he has a compressed disc in the past.  I have advised the patient to follow-up with his PCP for continued follow-up test or referrals to specialist to treat spinal issues.   Current Outpatient Medications on File Prior to Visit  Medication Sig Dispense Refill  . apixaban (ELIQUIS) 5 MG TABS tablet Take 5 mg by mouth 2 (two) times daily.    Marland Kitchen atorvastatin (LIPITOR) 40 MG tablet Take 40 mg by mouth daily.    . nitroGLYCERIN (NITROSTAT) 0.4 MG SL tablet PLACE 1 TABLET (0.4 MG TOTAL) UNDER THE TONGUE EVERY 5 (FIVE) MINUTES AS NEEDED FOR CHEST PAIN. 100 tablet 0  . blood glucose meter kit and  supplies Dispense based on patient and insurance preference. Use once daily in the morning. (FOR ICD-10 E11.9). 1 each 0   No current facility-administered medications on file prior to visit.     There are no Patient Instructions on file for this visit. No follow-ups on file.   Kris Hartmann, NP  This note was completed with Sales executive.  Any errors are purely unintentional.

## 2018-11-22 ENCOUNTER — Other Ambulatory Visit: Payer: Self-pay

## 2018-11-23 ENCOUNTER — Inpatient Hospital Stay: Payer: Medicare Other

## 2018-11-23 ENCOUNTER — Other Ambulatory Visit: Payer: Self-pay

## 2018-11-23 ENCOUNTER — Ambulatory Visit: Payer: Self-pay | Admitting: Pharmacist

## 2018-11-23 ENCOUNTER — Inpatient Hospital Stay: Payer: Medicare Other | Attending: Oncology | Admitting: Oncology

## 2018-11-23 ENCOUNTER — Encounter: Payer: Self-pay | Admitting: Oncology

## 2018-11-23 VITALS — BP 130/72 | Temp 97.7°F | Resp 18 | Ht 73.03 in | Wt 324.8 lb

## 2018-11-23 DIAGNOSIS — I252 Old myocardial infarction: Secondary | ICD-10-CM | POA: Diagnosis not present

## 2018-11-23 DIAGNOSIS — K760 Fatty (change of) liver, not elsewhere classified: Secondary | ICD-10-CM | POA: Insufficient documentation

## 2018-11-23 DIAGNOSIS — F17210 Nicotine dependence, cigarettes, uncomplicated: Secondary | ICD-10-CM | POA: Diagnosis not present

## 2018-11-23 DIAGNOSIS — J439 Emphysema, unspecified: Secondary | ICD-10-CM | POA: Insufficient documentation

## 2018-11-23 DIAGNOSIS — I700 Atherosclerosis of aorta: Secondary | ICD-10-CM | POA: Insufficient documentation

## 2018-11-23 DIAGNOSIS — I2510 Atherosclerotic heart disease of native coronary artery without angina pectoris: Secondary | ICD-10-CM | POA: Insufficient documentation

## 2018-11-23 DIAGNOSIS — I82431 Acute embolism and thrombosis of right popliteal vein: Secondary | ICD-10-CM

## 2018-11-23 DIAGNOSIS — I739 Peripheral vascular disease, unspecified: Secondary | ICD-10-CM | POA: Insufficient documentation

## 2018-11-23 DIAGNOSIS — E119 Type 2 diabetes mellitus without complications: Secondary | ICD-10-CM

## 2018-11-23 DIAGNOSIS — E1142 Type 2 diabetes mellitus with diabetic polyneuropathy: Secondary | ICD-10-CM | POA: Insufficient documentation

## 2018-11-23 DIAGNOSIS — I2699 Other pulmonary embolism without acute cor pulmonale: Secondary | ICD-10-CM

## 2018-11-23 DIAGNOSIS — Z86711 Personal history of pulmonary embolism: Secondary | ICD-10-CM | POA: Diagnosis not present

## 2018-11-23 DIAGNOSIS — I509 Heart failure, unspecified: Secondary | ICD-10-CM | POA: Insufficient documentation

## 2018-11-23 LAB — CBC WITH DIFFERENTIAL/PLATELET
Abs Immature Granulocytes: 0.03 10*3/uL (ref 0.00–0.07)
Basophils Absolute: 0 10*3/uL (ref 0.0–0.1)
Basophils Relative: 0 %
Eosinophils Absolute: 0.2 10*3/uL (ref 0.0–0.5)
Eosinophils Relative: 3 %
HCT: 42 % (ref 39.0–52.0)
Hemoglobin: 13.6 g/dL (ref 13.0–17.0)
Immature Granulocytes: 0 %
Lymphocytes Relative: 25 %
Lymphs Abs: 1.7 10*3/uL (ref 0.7–4.0)
MCH: 30.3 pg (ref 26.0–34.0)
MCHC: 32.4 g/dL (ref 30.0–36.0)
MCV: 93.5 fL (ref 80.0–100.0)
Monocytes Absolute: 0.6 10*3/uL (ref 0.1–1.0)
Monocytes Relative: 9 %
Neutro Abs: 4.2 10*3/uL (ref 1.7–7.7)
Neutrophils Relative %: 63 %
Platelets: 245 10*3/uL (ref 150–400)
RBC: 4.49 MIL/uL (ref 4.22–5.81)
RDW: 13.2 % (ref 11.5–15.5)
WBC: 6.9 10*3/uL (ref 4.0–10.5)
nRBC: 0 % (ref 0.0–0.2)

## 2018-11-23 NOTE — Chronic Care Management (AMB) (Signed)
  Chronic Care Management   Note  11/23/2018 Name: Ronald Malone MRN: [id number] DOB: Jul 18, 1955  Ronald Malone is a 63 y.o. year old male who sees [name], [place] for primary care. [name] asked the CCM team to consult the patient for assistance with chronic disease management related to DM education. Referral was placed 11/14/2018. Telephone outreach to patient today to introduce CCM services.    Plan:Ronald Malone agreed to services and verbal consent obtained. I have scheduled an appointment for Ronald Malone next week for Monday, 9/14 at [place] was given information about Chronic Care Management services today including:  1. CCM service includes personalized support from designated clinical staff supervised by her physician, including individualized plan of care and coordination with other care providers 2. 24/7 contact phone numbers for assistance for urgent and routine care needs. 3. Service will only be billed when office clinical staff spend 20 minutes or more in a month to coordinate care. 4. Only one practitioner may furnish and bill the service in a calendar month. 5. The patient may stop CCM services at any time (effective at the end of the month) by phone call to the office staff. 6. The patient will be responsible for cost sharing (co-pay) of up to 20% of the service fee (after annual deductible is met).  Patient agreed to services and verbal consent obtained.     Ruben Reason, PharmD Clinical Pharmacist [place] [phone]

## 2018-11-23 NOTE — Patient Instructions (Signed)
Ronald Malone was given information about Chronic Care Management services today including:  1. CCM service includes personalized support from designated clinical staff supervised by his physician, including individualized plan of care and coordination with other care providers 2. 24/7 contact phone numbers for assistance for urgent and routine care needs. 3. Service will only be billed when office clinical staff spend 20 minutes or more in a month to coordinate care. 4. Only one practitioner may furnish and bill the service in a calendar month. 5. The patient may stop CCM services at any time (effective at the end of the month) by phone call to the office staff. 6. The patient will be responsible for cost sharing (co-pay) of up to 20% of the service fee (after annual deductible is met).  Patient agreed to services and verbal consent obtained.

## 2018-11-23 NOTE — Progress Notes (Signed)
Hematology/Oncology Consult note [facility] Telephone:([phone] [phone]   Patient Care Team: Paulene Floor as PCP - General (Physician Assistant) Cathi Roan, [facility] (Pharmacist)  REFERRING PROVIDER: [name], PA-C  CHIEF COMPLAINTS/REASON FOR VISIT:  Evaluation of pulmonary embolism.   HISTORY OF PRESENTING ILLNESS:  Ronald Malone is a 63 y.o. male who was seen in consultation at the request of Pollak, Adriana M, PA-C for evaluation of pulmonary embolism and DVT. Patient was diagnosed with acute pulmonary embolism on 09/01/2018. Patient's presenting symptoms included left sided upper chest wall pain, pruritic, cough, SOB.   09/01/2018 CT angio chest showed non conclusive pulmonary embolus at the bifurcation of the right middle and lower lobe pulmonary arteries. Punctate embolus with a lingular branch.  Extensive coronary artery disease, emphysema, hepatic steatosis.  09/01/2018 US venous bilateral lower extremities showed non occlusive DVT popliteal vein.  2D echo showed LVEF 60%, no strain pattern.  Patient was started on therapeutic lovenox and switched to Eliquis.    Immobilization factors: denies  History of previous thrombosis event denies  Family history of thrombosis: mother developed DVT   Age appropriate cancer screening:   Smokes 2 packs a day never had colonoscopy done.  Today he reports feeling well. Denies breathing difficulties, chest pain, abdominal pain.   Patient also has a peripheral polyneuropathy.  From diabetes. Patient follows up with vascular surgery for chronic hip and buttock claudication as well as neuropathy symptoms in the feet and ankles.  Patient underwent angiography for further evaluation on 10/23/2018.  Mired peripheral arterial disease. He has patchy redness on anterior right lower extremity, was recently seen by primary care provider patient was prescribed Keflex for possible cellulitis  versus venous stasis versus thrombophlebitis  Review of Systems  Constitutional: Positive for fatigue. Negative for appetite change, chills, fever and unexpected weight change.  HENT:   Negative for hearing loss and voice change.   Eyes: Negative for eye problems and icterus.  Respiratory: Negative for chest tightness, cough and shortness of breath.   Cardiovascular: Negative for chest pain and leg swelling.  Gastrointestinal: Negative for abdominal distention and abdominal pain.  Endocrine: Negative for hot flashes.  Genitourinary: Negative for difficulty urinating, dysuria and frequency.   Musculoskeletal: Negative for arthralgias.  Skin: Negative for itching and rash.       Right lower extremity patchy redness  Neurological: Positive for numbness. Negative for light-headedness.  Hematological: Negative for adenopathy. Does not bruise/bleed easily.  Psychiatric/Behavioral: Negative for confusion.    MEDICAL HISTORY:  Past Medical History:  Diagnosis Date   CHF (congestive heart failure) (HCC)    Clotting disorder (HCC)    MI (myocardial infarction) ([place])    Pulmonary embolism (HCC)     SURGICAL HISTORY: Past Surgical History:  Procedure Laterality Date   CARDIAC CATHETERIZATION     COLON SURGERY     1/3 colon removed as an infant   LOWER EXTREMITY ANGIOGRAPHY Right 10/23/2018   Procedure: LOWER EXTREMITY ANGIOGRAPHY;  Surgeon: Algernon Huxley, MD;  Location: [facility];  Service: Cardiovascular;  Laterality: Right;    SOCIAL HISTORY: Social History   Socioeconomic History   Marital status: Married    Spouse name: [name]    Number of children: 2   Years of education: Not on file   Highest education level: Not on file  Occupational History   Occupation: RETIRED   Social Designer, fashion/clothing strain: Not hard at all  Food insecurity    Worry: Never true    Inability: Never true   Transportation needs    Medical: No    Non-medical: No    Tobacco Use   Smoking status: Current Every Day Smoker    Packs/day: 3.00    Years: 45.00    Pack years: 135.00    Types: Cigarettes    Last attempt to quit: 09/15/2018    Years since quitting: 0.1   Smokeless tobacco: Never Used  Substance and Sexual Activity   Alcohol use: Yes   Drug use: Yes    Types: Psilocybin   Sexual activity: Not on file  Lifestyle   Physical activity    Days per week: Not on file    Minutes per session: Not on file   Stress: Only a little  Relationships   Social connections    Talks on phone: More than three times a week    Gets together: Not on file    Attends religious service: Not on file    Active member of club or organization: Not on file    Attends meetings of clubs or organizations: Not on file    Relationship status: Not on file   Intimate partner violence    Fear of current or ex partner: Not on file    Emotionally abused: No    Physically abused: No    Forced sexual activity: No  Other Topics Concern   Not on file  Social History Narrative   Not on file    FAMILY HISTORY: Family History  Problem Relation Age of Onset   Parkinson's disease Mother    Dementia Father     ALLERGIES:  has No Known Allergies.  MEDICATIONS:  Current Outpatient Medications  Medication Sig Dispense Refill   apixaban (ELIQUIS) 5 MG TABS tablet Take 5 mg by mouth 2 (two) times daily.     atorvastatin (LIPITOR) 40 MG tablet Take 40 mg by mouth daily.     nitroGLYCERIN (NITROSTAT) 0.4 MG SL tablet PLACE 1 TABLET (0.4 MG TOTAL) UNDER THE TONGUE EVERY 5 (FIVE) MINUTES AS NEEDED FOR CHEST PAIN. 100 tablet 0   blood glucose meter kit and supplies Dispense based on patient and insurance preference. Use once daily in the morning. (FOR ICD-10 E11.9). (Patient not taking: Reported on 11/14/2018) 1 each 0   No current facility-administered medications for this visit.      PHYSICAL EXAMINATION: ECOG PERFORMANCE STATUS: 1 - Symptomatic but  completely ambulatory Vitals:   11/23/18 0942  BP: 130/72  Resp: 18  Temp: 97.7 F (36.5 C)   Filed Weights   11/23/18 0942  Weight: (!) 324 lb 12.8 oz (147.3 kg)    Physical Exam Constitutional:      General: He is not in acute distress.    Comments: Morbidly obese  HENT:     Head: Normocephalic and atraumatic.  Eyes:     General: No scleral icterus.    Pupils: Pupils are equal, round, and reactive to light.  Neck:     Musculoskeletal: Normal range of motion and neck supple.  Cardiovascular:     Rate and Rhythm: Normal rate and regular rhythm.     Heart sounds: Normal heart sounds.  Pulmonary:     Effort: Pulmonary effort is normal. No respiratory distress.     Breath sounds: No wheezing.  Abdominal:     General: Bowel sounds are normal. There is no distension.     Palpations: Abdomen is soft. There  is no mass.     Tenderness: There is no abdominal tenderness.  Musculoskeletal: Normal range of motion.        General: No deformity.     Comments: Trace right lower extremity edema.   Skin:    General: Skin is warm and dry.     Findings: No rash.     Comments: Patchy redness over right shin.   Neurological:     Mental Status: He is alert and oriented to person, place, and time.     Cranial Nerves: No cranial nerve deficit.     Coordination: Coordination normal.  Psychiatric:        Behavior: Behavior normal.        Thought Content: Thought content normal.     RADIOGRAPHIC STUDIES: I have personally reviewed the radiological images as listed and agreed with the findings in the report. Ct Angio Chest Pe W And/or Wo Contrast  Result Date: 09/01/2018 CLINICAL DATA:  Chest pain, complex. Personal history of myocardial infarction. EXAM: CT ANGIOGRAPHY CHEST WITH CONTRAST TECHNIQUE: Multidetector CT imaging of the chest was performed using the standard protocol during bolus administration of intravenous contrast. Multiplanar CT image reconstructions and MIPs were  obtained to evaluate the vascular anatomy. CONTRAST:  113m ISOVUE-370 IOPAMIDOL (ISOVUE-370) INJECTION 76% COMPARISON:  None. FINDINGS: Cardiovascular: Heart size is normal. Extensive atherosclerotic changes are noted within the coronary arteries including the left main stem artery. Calcifications are present at the aortic valve and in the arch. There is no aneurysm. No significant stenosis is present at the great vessel origins. A 4 vessel arch configuration is present, a normal variant Pulmonary artery opacification is excellent. There is thrombus at the bifurcation of the right lower and middle lobar arteries this is nonocclusive. Nonocclusive embolus is present in the left upper lobe. No other definite emboli are present. Mediastinum/Nodes: Subcentimeter prevascular and paratracheal lymph nodes are present. Thoracic inlet is normal. Esophagus is normal. No significant hilar or axillary adenopathy is present. Lungs/Pleura: Dependent atelectasis is present. No significant airspace consolidation is present. There is no nodule or mass lesion. Centrilobular emphysematous changes are present. Upper Abdomen: There is diffuse fatty infiltration of the liver. Limited imaging of the upper abdomen is otherwise unremarkable. Musculoskeletal: Mild endplate changes are present throughout the thoracic spine. Fused anterior osteophytes are noted inferiorly. Sternum is normal. Review of the MIP images confirms the above findings. IMPRESSION: 1. Nonocclusive pulmonary embolus at the bifurcation of the right middle and lower lobar pulmonary arteries. 2. Additional punctate embolus within a lingular branch. 3.  Aortic Atherosclerosis (ICD10-I70.0). 4. Extensive coronary artery disease including calcifications within the left mainstem coronary artery. 5.  Emphysema (ICD10-J43.9). 6. Mild dependent atelectasis and scarring. 7. Hepatic steatosis. Electronically Signed   By: CSan MorelleM.D.   On: 09/01/2018 05:02   [place] Venous Img Lower Bilateral  Result Date: 09/01/2018 CLINICAL DATA:  Pulmonary embolus. EXAM: BILATERAL LOWER EXTREMITY VENOUS DOPPLER ULTRASOUND TECHNIQUE: Gray-scale sonography with graded compression, as well as color Doppler and duplex ultrasound were performed to evaluate the lower extremity deep venous systems from the level of the common femoral vein and including the common femoral, femoral, profunda femoral, popliteal and calf veins including the posterior tibial, peroneal and gastrocnemius veins when visible. The superficial great saphenous vein was also interrogated. Spectral Doppler was utilized to evaluate flow at rest and with distal augmentation maneuvers in the common femoral, femoral and popliteal veins. COMPARISON:  CT 09/01/2018. FINDINGS: RIGHT LOWER EXTREMITY Common Femoral Vein:  No evidence of thrombus. Normal compressibility, respiratory phasicity and response to augmentation. Saphenofemoral Junction: No evidence of thrombus. Normal compressibility and flow on color Doppler imaging. Profunda Femoral Vein: No evidence of thrombus. Normal compressibility and flow on color Doppler imaging. Femoral Vein: No evidence of thrombus. Normal compressibility, respiratory phasicity and response to augmentation. Popliteal Vein: Nonocclusive thrombus right popliteal vein. Calf Veins: No evidence of thrombus. Normal compressibility and flow on color Doppler imaging. Superficial Great Saphenous Vein: No evidence of thrombus. Normal compressibility. Other Findings:  None. LEFT LOWER EXTREMITY Common Femoral Vein: No evidence of thrombus. Normal compressibility, respiratory phasicity and response to augmentation. Saphenofemoral Junction: No evidence of thrombus. Normal compressibility and flow on color Doppler imaging. Profunda Femoral Vein: No evidence of thrombus. Normal compressibility and flow on color Doppler imaging. Femoral Vein: No evidence of thrombus. Normal compressibility, respiratory phasicity and  response to augmentation. Popliteal Vein: No evidence of thrombus. Normal compressibility, respiratory phasicity and response to augmentation. Calf Veins: No evidence of thrombus. Normal compressibility and flow on color Doppler imaging. Superficial Great Saphenous Vein: No evidence of thrombus. Normal compressibility. Other Findings:  None. IMPRESSION: Nonocclusive deep venous thrombosis right popliteal vein. Electronically Signed   By: Marcello Moores  Register   On: 09/01/2018 12:24   Vas [place] Abi With/wo Tbi  Result Date: 10/03/2018 LOWER EXTREMITY DOPPLER STUDY Indications: Claudication, and rest pain.  Performing Technologist: Charlane Ferretti RT (R)(VS)  Examination Guidelines: A complete evaluation includes at minimum, Doppler waveform signals and systolic blood pressure reading at the level of bilateral brachial, anterior tibial, and posterior tibial arteries, when vessel segments are accessible. Bilateral testing is considered an integral part of a complete examination. Photoelectric Plethysmograph (PPG) waveforms and toe systolic pressure readings are included as required and additional duplex testing as needed. Limited examinations for reoccurring indications may be performed as noted.  ABI Findings: +---------+------------------+-----+----------+--------+  Right     Rt Pressure (mmHg) Index Waveform   Comment   +---------+------------------+-----+----------+--------+  Brachial  154                                           +---------+------------------+-----+----------+--------+  ATA       143                0.88  monophasic           +---------+------------------+-----+----------+--------+  PTA       147                0.90  monophasic           +---------+------------------+-----+----------+--------+  Great Toe 132                0.81  Normal               +---------+------------------+-----+----------+--------+ +---------+------------------+-----+----------+-------+  Left      Lt Pressure  (mmHg) Index Waveform   Comment  +---------+------------------+-----+----------+-------+  Brachial  163                                          +---------+------------------+-----+----------+-------+  ATA       173                1.06  monophasic          +---------+------------------+-----+----------+-------+  PTA       181                1.11  biphasic            +---------+------------------+-----+----------+-------+  Great Toe 117                0.72  Normal              +---------+------------------+-----+----------+-------+ +-------+-----------+-----------+------------+------------+  ABI/TBI Today's ABI Today's TBI Previous ABI Previous TBI  +-------+-----------+-----------+------------+------------+  Right   .90         .81                                    +-------+-----------+-----------+------------+------------+  Left    1.11        .72                                    +-------+-----------+-----------+------------+------------+ Summary: Right: Resting right ankle-brachial index indicates mild right lower extremity arterial disease. The right toe-brachial index is normal. Left: Resting left ankle-brachial index is within normal range. No evidence of significant left lower extremity arterial disease. The left toe-brachial index is normal.  *See table(s) above for measurements and observations.  Electronically signed by Leotis Pain MD on 10/03/2018 at 1:03:22 PM.   Final    Vas [place] Lower Extremity Arterial Duplex  Result Date: 10/17/2018 LOWER EXTREMITY ARTERIAL DUPLEX STUDY Indications: Peripheral artery disease, and Monophasic flow seen on previous              ABI.  Current ABI: rt = .90 lt = 1.06 Performing Technologist: Concha Norway RVT  Examination Guidelines: A complete evaluation includes B-mode imaging, spectral Doppler, color Doppler, and power Doppler as needed of all accessible portions of each vessel. Bilateral testing is considered an integral part of a complete examination. Limited  examinations for reoccurring indications may be performed as noted.  +----------+--------+-----+--------+----------+--------+  RIGHT      PSV cm/s Ratio Stenosis Waveform   Comments  +----------+--------+-----+--------+----------+--------+  CFA Mid    107                     triphasic            +----------+--------+-----+--------+----------+--------+  DFA        33                      biphasic             +----------+--------+-----+--------+----------+--------+  SFA Prox   67                      triphasic            +----------+--------+-----+--------+----------+--------+  SFA Mid    58                      triphasic            +----------+--------+-----+--------+----------+--------+  SFA Distal 118                     triphasic            +----------+--------+-----+--------+----------+--------+  POP Distal 43  biphasic             +----------+--------+-----+--------+----------+--------+  ATA Distal 79                      monophasic           +----------+--------+-----+--------+----------+--------+  PTA Distal 94                      monophasic           +----------+--------+-----+--------+----------+--------+  +----------+--------+-----+--------+----------+--------+  LEFT       PSV cm/s Ratio Stenosis Waveform   Comments  +----------+--------+-----+--------+----------+--------+  CFA Mid    118                     triphasic            +----------+--------+-----+--------+----------+--------+  SFA Prox   113                     triphasic            +----------+--------+-----+--------+----------+--------+  SFA Mid    109                     triphasic            +----------+--------+-----+--------+----------+--------+  SFA Distal 179                     triphasic            +----------+--------+-----+--------+----------+--------+  POP Distal 64                      triphasic            +----------+--------+-----+--------+----------+--------+  ATA Distal 119                     monophasic            +----------+--------+-----+--------+----------+--------+  PTA Distal 122                     triphasic            +----------+--------+-----+--------+----------+--------+  Summary: Right: Mild atherosclerosis throughout. Monophasic flow distally. Left: Mild atherosclerosis throughout. Monophasic flow distally.  See table(s) above for measurements and observations. Electronically signed by Leotis Pain MD on 10/17/2018 at 1:21:04 PM.    Final    Vas US Aorta/ivc/iliacs  Result Date: 10/17/2018 ABDOMINAL AORTA STUDY Indications: PAD; abn rt abi .90 09/26/2018 Limitations: Air/bowel gas and obesity.  Performing Technologist: Concha Norway RVT  Examination Guidelines: A complete evaluation includes B-mode imaging, spectral Doppler, color Doppler, and power Doppler as needed of all accessible portions of each vessel. Bilateral testing is considered an integral part of a complete examination. Limited examinations for reoccurring indications may be performed as noted.  Abdominal Aorta Findings: +-------------+-------+----------+----------+----------+--------+--------+  Location      AP (cm) Trans (cm) PSV (cm/s) Waveform   Thrombus Comments  +-------------+-------+----------+----------+----------+--------+--------+  Proximal                         73         biphasic                      +-------------+-------+----------+----------+----------+--------+--------+  Mid  78         biphasic                      +-------------+-------+----------+----------+----------+--------+--------+  Distal                                      monophasic                    +-------------+-------+----------+----------+----------+--------+--------+  RT CIA Prox                      111        monophasic                    +-------------+-------+----------+----------+----------+--------+--------+  RT CIA Distal                    140        monophasic                     +-------------+-------+----------+----------+----------+--------+--------+  RT EIA Prox                      284        biphasic                      +-------------+-------+----------+----------+----------+--------+--------+  RT EIA Mid                       177        biphasic                      +-------------+-------+----------+----------+----------+--------+--------+  RT EIA Distal                    143        monophasic                    +-------------+-------+----------+----------+----------+--------+--------+  LT CIA Prox                      140        biphasic                      +-------------+-------+----------+----------+----------+--------+--------+  LT CIA Distal                    141        biphasic                      +-------------+-------+----------+----------+----------+--------+--------+  LT EIA Prox                      212        biphasic                      +-------------+-------+----------+----------+----------+--------+--------+  LT EIA Mid                       157        biphasic                      +-------------+-------+----------+----------+----------+--------+--------+  LT EIA Distal  132        biphasic                      +-------------+-------+----------+----------+----------+--------+--------+  Summary: Abdominal Aorta: Moderate atherosclerosis throughout. Right CIA moderate atherosclerosis, monophasic flow.Left CIA moderate atherosclerosis, biphasic flow.  *See table(s) above for measurements and observations.  Electronically signed by Leotis Pain MD on 10/17/2018 at 1:21:07 PM.   Final      LABORATORY DATA:  I have reviewed the data as listed Lab Results  Component Value Date   WBC 6.9 11/23/2018   HGB 13.6 11/23/2018   HCT 42.0 11/23/2018   MCV 93.5 11/23/2018   PLT 245 11/23/2018   Recent Labs    08/31/18 2233 09/02/18 0645 10/18/18 1052 10/23/18 0842  NA 138 138 140  --   K 4.2 3.8 4.4  --   CL 108 107 103  --   CO2 22 23 21   --     GLUCOSE 119* 109* 148*  --   BUN 23 16 24 19   CREATININE 0.98 0.74 0.90 0.84  CALCIUM 9.0 8.4* 9.1  --   GFRNONAA >60 >60 89 >60  GFRAA >60 >60 103 >60  PROT 7.8  --  6.5  --   ALBUMIN 4.1  --  4.0  --   AST 18  --  14  --   ALT 25  --  20  --   ALKPHOS 76  --  94  --   BILITOT 0.4  --  0.5  --    Iron/TIBC/Ferritin/ %Sat No results found for: IRON, TIBC, FERRITIN, IRONPCTSAT     ASSESSMENT & PLAN:  1. Other acute pulmonary embolism without acute cor pulmonale ([place])   2. Acute deep vein thrombosis (DVT) of popliteal vein of right lower extremity (HCC)   Images were independently reviewed by me and discussed with patient. Patient has acute unprovoked PE as well as right lower extremity distal DVT Agree with anticoagulation with Eliquis 5 mg twice daily. Patient tolerating well.  Recommend to continue. Check CBC, factor V Leiden mutation, prothrombin gene mutation. I had a discussion with patient that given the nature of unprovoked DVT/PE, male gender, his deep vein thrombosis recurrence risk is high.  His morbid obesity, sedentary lifestyle, smoking also may contribute to thrombosis. Recommend patient to complete a total 6 months of anticoagulation with Eliquis 5 mg twice daily-December 2020 I would obtain right lower extremity ultrasound for evaluation. He will then be switched to Eliquis 2.5 mg twice daily for long-term anticoagulation maintenance. Patient agrees with the plan.   Orders Placed This Encounter  Procedures   US Venous Img Lower Unilateral Right    Standing Status:   Future    Standing Expiration Date:   11/23/2019    Order Specific Question:   Reason for Exam (SYMPTOM  OR DIAGNOSIS REQUIRED)    Answer:   hx of DVT    Order Specific Question:   Preferred imaging location?    Answer:   [facility]   CBC with Differential/Platelet    Standing Status:   Future    Number of Occurrences:   1    Standing Expiration Date:   11/23/2019   Factor 5 leiden     Standing Status:   Future    Number of Occurrences:   1    Standing Expiration Date:   11/23/2019   Prothrombin gene mutation    Standing Status:   Future  Number of Occurrences:   1    Standing Expiration Date:   11/23/2019   CBC with Differential/Platelet    Standing Status:   Future    Standing Expiration Date:   11/23/2019   Comprehensive metabolic panel    Standing Status:   Future    Standing Expiration Date:   11/23/2019    All questions were answered. The patient knows to call the clinic with any problems questions or concerns. Follow-up in 3 months. Cc [name], PA-C   Thank you for this kind referral and the opportunity to participate in the care of this patient. A copy of today's note is routed to referring provider  Total face to face encounter time for this patient visit was 45 min. >50% of the time was  spent in counseling and coordination of care.    Earlie Server, MD, PhD 11/23/2018

## 2018-11-23 NOTE — Progress Notes (Signed)
Patient was diagnosed with 2 PE and 1 DVT 3 months ago.  He was prescribed Eliquis.  He states he is not happy about being here and wants to discontinue the Eliquis.

## 2018-11-27 ENCOUNTER — Other Ambulatory Visit: Payer: Self-pay

## 2018-11-27 ENCOUNTER — Ambulatory Visit (INDEPENDENT_AMBULATORY_CARE_PROVIDER_SITE_OTHER): Payer: Medicare Other | Admitting: Pharmacist

## 2018-11-27 DIAGNOSIS — G629 Polyneuropathy, unspecified: Secondary | ICD-10-CM

## 2018-11-27 DIAGNOSIS — E119 Type 2 diabetes mellitus without complications: Secondary | ICD-10-CM

## 2018-11-27 NOTE — Patient Instructions (Signed)
Diabetes Mellitus and Exercise Exercising regularly is important for your overall health, especially when you have diabetes (diabetes mellitus). Exercising is not only about losing weight. It has many other health benefits, such as increasing muscle strength and bone density and reducing body fat and stress. This leads to improved fitness, flexibility, and endurance, all of which result in better overall health. Exercise has additional benefits for people with diabetes, including:  Reducing appetite.  Helping to lower and control blood glucose.  Lowering blood pressure.  Helping to control amounts of fatty substances (lipids) in the blood, such as cholesterol and triglycerides.  Helping the body to respond better to insulin (improving insulin sensitivity).  Reducing how much insulin the body needs.  Decreasing the risk for heart disease by: ? Lowering cholesterol and triglyceride levels. ? Increasing the levels of good cholesterol. ? Lowering blood glucose levels. What is my activity plan? Your health care provider or certified diabetes educator can help you make a plan for the type and frequency of exercise (activity plan) that works for you. Make sure that you:  Do at least 150 minutes of moderate-intensity or vigorous-intensity exercise each week. This could be brisk walking, biking, or water aerobics. ? Do stretching and strength exercises, such as yoga or weightlifting, at least 2 times a week. ? Spread out your activity over at least 3 days of the week.  Get some form of physical activity every day. ? Do not go more than 2 days in a row without some kind of physical activity. ? Avoid being inactive for more than 30 minutes at a time. Take frequent breaks to walk or stretch.  Choose a type of exercise or activity that you enjoy, and set realistic goals.  Start slowly, and gradually increase the intensity of your exercise over time. What do I need to know about managing my  diabetes?   Check your blood glucose before and after exercising. ? If your blood glucose is 240 mg/dL (13.3 mmol/L) or higher before you exercise, check your urine for ketones. If you have ketones in your urine, do not exercise until your blood glucose returns to normal. ? If your blood glucose is 100 mg/dL (5.6 mmol/L) or lower, eat a snack containing 15-20 grams of carbohydrate. Check your blood glucose 15 minutes after the snack to make sure that your level is above 100 mg/dL (5.6 mmol/L) before you start your exercise.  Know the symptoms of low blood glucose (hypoglycemia) and how to treat it. Your risk for hypoglycemia increases during and after exercise. Common symptoms of hypoglycemia can include: ? Hunger. ? Anxiety. ? Sweating and feeling clammy. ? Confusion. ? Dizziness or feeling light-headed. ? Increased heart rate or palpitations. ? Blurry vision. ? Tingling or numbness around the mouth, lips, or tongue. ? Tremors or shakes. ? Irritability.  Keep a rapid-acting carbohydrate snack available before, during, and after exercise to help prevent or treat hypoglycemia.  Avoid injecting insulin into areas of the body that are going to be exercised. For example, avoid injecting insulin into: ? The arms, when playing tennis. ? The legs, when jogging.  Keep records of your exercise habits. Doing this can help you and your health care provider adjust your diabetes management plan as needed. Write down: ? Food that you eat before and after you exercise. ? Blood glucose levels before and after you exercise. ? The type and amount of exercise you have done. ? When your insulin is expected to peak, if you use  insulin. Avoid exercising at times when your insulin is peaking.  When you start a new exercise or activity, work with your health care provider to make sure the activity is safe for you, and to adjust your insulin, medicines, or food intake as needed.  Drink plenty of water while  you exercise to prevent dehydration or heat stroke. Drink enough fluid to keep your urine clear or pale yellow. Summary  Exercising regularly is important for your overall health, especially when you have diabetes (diabetes mellitus).  Exercising has many health benefits, such as increasing muscle strength and bone density and reducing body fat and stress.  Your health care provider or certified diabetes educator can help you make a plan for the type and frequency of exercise (activity plan) that works for you.  When you start a new exercise or activity, work with your health care provider to make sure the activity is safe for you, and to adjust your insulin, medicines, or food intake as needed. This information is not intended to replace advice given to you by your health care provider. Make sure you discuss any questions you have with your health care provider. Document Released: 05/22/2003 Document Revised: 09/23/2016 Document Reviewed: 08/11/2015 Elsevier Patient Education  [place].  Diabetes Mellitus and Nutrition, Adult When you have diabetes (diabetes mellitus), it is very important to have healthy eating habits because your blood sugar (glucose) levels are greatly affected by what you eat and drink. Eating healthy foods in the appropriate amounts, at about the same times every day, can help you:  Control your blood glucose.  Lower your risk of heart disease.  Improve your blood pressure.  Reach or maintain a healthy weight. Every person with diabetes is different, and each person has different needs for a meal plan. Your health care provider may recommend that you work with a diet and nutrition specialist (dietitian) to make a meal plan that is best for you. Your meal plan may vary depending on factors such as:  The calories you need.  The medicines you take.  Your weight.  Your blood glucose, blood pressure, and cholesterol levels.  Your activity level.  Other  health conditions you have, such as heart or kidney disease. How do carbohydrates affect me? Carbohydrates, also called carbs, affect your blood glucose level more than any other type of food. Eating carbs naturally raises the amount of glucose in your blood. Carb counting is a method for keeping track of how many carbs you eat. Counting carbs is important to keep your blood glucose at a healthy level, especially if you use insulin or take certain oral diabetes medicines. It is important to know how many carbs you can safely have in each meal. This is different for every person. Your dietitian can help you calculate how many carbs you should have at each meal and for each snack. Foods that contain carbs include:  Bread, cereal, rice, pasta, and crackers.  Potatoes and corn.  Peas, beans, and lentils.  Milk and yogurt.  Fruit and juice.  Desserts, such as cakes, cookies, ice cream, and candy. How does alcohol affect me? Alcohol can cause a sudden decrease in blood glucose (hypoglycemia), especially if you use insulin or take certain oral diabetes medicines. Hypoglycemia can be a life-threatening condition. Symptoms of hypoglycemia (sleepiness, dizziness, and confusion) are similar to symptoms of having too much alcohol. If your health care provider says that alcohol is safe for you, follow these guidelines:  Limit alcohol intake  to no more than 1 drink per day for nonpregnant women and 2 drinks per day for men. One drink equals 12 oz of beer, 5 oz of wine, or 1 oz of hard liquor.  Do not drink on an empty stomach.  Keep yourself hydrated with water, diet soda, or unsweetened iced tea.  Keep in mind that regular soda, juice, and other mixers may contain a lot of sugar and must be counted as carbs. What are tips for following this plan?  Reading food labels  Start by checking the serving size on the "Nutrition Facts" label of packaged foods and drinks. The amount of calories, carbs,  fats, and other nutrients listed on the label is based on one serving of the item. Many items contain more than one serving per package.  Check the total grams (g) of carbs in one serving. You can calculate the number of servings of carbs in one serving by dividing the total carbs by 15. For example, if a food has 30 g of total carbs, it would be equal to 2 servings of carbs.  Check the number of grams (g) of saturated and trans fats in one serving. Choose foods that have low or no amount of these fats.  Check the number of milligrams (mg) of salt (sodium) in one serving. Most people should limit total sodium intake to less than 2,300 mg per day.  Always check the nutrition information of foods labeled as "low-fat" or "nonfat". These foods may be higher in added sugar or refined carbs and should be avoided.  Talk to your dietitian to identify your daily goals for nutrients listed on the label. Shopping  Avoid buying canned, premade, or processed foods. These foods tend to be high in fat, sodium, and added sugar.  Shop around the outside edge of the grocery store. This includes fresh fruits and vegetables, bulk grains, fresh meats, and fresh dairy. Cooking  Use low-heat cooking methods, such as baking, instead of high-heat cooking methods like deep frying.  Cook using healthy oils, such as olive, canola, or sunflower oil.  Avoid cooking with butter, cream, or high-fat meats. Meal planning  Eat meals and snacks regularly, preferably at the same times every day. Avoid going long periods of time without eating.  Eat foods high in fiber, such as fresh fruits, vegetables, beans, and whole grains. Talk to your dietitian about how many servings of carbs you can eat at each meal.  Eat 4-6 ounces (oz) of lean protein each day, such as lean meat, chicken, fish, eggs, or tofu. One oz of lean protein is equal to: ? 1 oz of meat, chicken, or fish. ? 1 egg. ?  cup of tofu.  Eat some foods each  day that contain healthy fats, such as avocado, nuts, seeds, and fish. Lifestyle  Check your blood glucose regularly.  Exercise regularly as told by your health care provider. This may include: ? 150 minutes of moderate-intensity or vigorous-intensity exercise each week. This could be brisk walking, biking, or water aerobics. ? Stretching and doing strength exercises, such as yoga or weightlifting, at least 2 times a week.  Take medicines as told by your health care provider.  Do not use any products that contain nicotine or tobacco, such as cigarettes and e-cigarettes. If you need help quitting, ask your health care provider.  Work with a Social worker or diabetes educator to identify strategies to manage stress and any emotional and social challenges. Questions to ask a health care provider  Do I need to meet with a diabetes educator?  Do I need to meet with a dietitian?  What number can I call if I have questions?  When are the best times to check my blood glucose? Where to find more information:  American Diabetes Association: diabetes.org  Academy of Nutrition and Dietetics: www.eatright.[employer] of Diabetes and Digestive and Kidney Diseases (NIH): [employer] Summary  A healthy meal plan will help you control your blood glucose and maintain a healthy lifestyle.  Working with a diet and nutrition specialist (dietitian) can help you make a meal plan that is best for you.  Keep in mind that carbohydrates (carbs) and alcohol have immediate effects on your blood glucose levels. It is important to count carbs and to use alcohol carefully. This information is not intended to replace advice given to you by your health care provider. Make sure you discuss any questions you have with your health care provider. Document Released: 11/26/2004 Document Revised: 02/11/2017 Document Reviewed: 04/05/2016 Elsevier Patient Education  [place].  Diabetes Mellitus  and Nutrition, Adult When you have diabetes (diabetes mellitus), it is very important to have healthy eating habits because your blood sugar (glucose) levels are greatly affected by what you eat and drink. Eating healthy foods in the appropriate amounts, at about the same times every day, can help you:  Control your blood glucose.  Lower your risk of heart disease.  Improve your blood pressure.  Reach or maintain a healthy weight. Every person with diabetes is different, and each person has different needs for a meal plan. Your health care provider may recommend that you work with a diet and nutrition specialist (dietitian) to make a meal plan that is best for you. Your meal plan may vary depending on factors such as:  The calories you need.  The medicines you take.  Your weight.  Your blood glucose, blood pressure, and cholesterol levels.  Your activity level.  Other health conditions you have, such as heart or kidney disease. How do carbohydrates affect me? Carbohydrates, also called carbs, affect your blood glucose level more than any other type of food. Eating carbs naturally raises the amount of glucose in your blood. Carb counting is a method for keeping track of how many carbs you eat. Counting carbs is important to keep your blood glucose at a healthy level, especially if you use insulin or take certain oral diabetes medicines. It is important to know how many carbs you can safely have in each meal. This is different for every person. Your dietitian can help you calculate how many carbs you should have at each meal and for each snack. Foods that contain carbs include:  Bread, cereal, rice, pasta, and crackers.  Potatoes and corn.  Peas, beans, and lentils.  Milk and yogurt.  Fruit and juice.  Desserts, such as cakes, cookies, ice cream, and candy. How does alcohol affect me? Alcohol can cause a sudden decrease in blood glucose (hypoglycemia), especially if you use  insulin or take certain oral diabetes medicines. Hypoglycemia can be a life-threatening condition. Symptoms of hypoglycemia (sleepiness, dizziness, and confusion) are similar to symptoms of having too much alcohol. If your health care provider says that alcohol is safe for you, follow these guidelines:  Limit alcohol intake to no more than 1 drink per day for nonpregnant women and 2 drinks per day for men. One drink equals 12 oz of beer, 5 oz of wine, or 1 oz of hard liquor.  Do not drink on an empty stomach.  Keep yourself hydrated with water, diet soda, or unsweetened iced tea.  Keep in mind that regular soda, juice, and other mixers may contain a lot of sugar and must be counted as carbs. What are tips for following this plan?  Reading food labels  Start by checking the serving size on the "Nutrition Facts" label of packaged foods and drinks. The amount of calories, carbs, fats, and other nutrients listed on the label is based on one serving of the item. Many items contain more than one serving per package.  Check the total grams (g) of carbs in one serving. You can calculate the number of servings of carbs in one serving by dividing the total carbs by 15. For example, if a food has 30 g of total carbs, it would be equal to 2 servings of carbs.  Check the number of grams (g) of saturated and trans fats in one serving. Choose foods that have low or no amount of these fats.  Check the number of milligrams (mg) of salt (sodium) in one serving. Most people should limit total sodium intake to less than 2,300 mg per day.  Always check the nutrition information of foods labeled as "low-fat" or "nonfat". These foods may be higher in added sugar or refined carbs and should be avoided.  Talk to your dietitian to identify your daily goals for nutrients listed on the label. Shopping  Avoid buying canned, premade, or processed foods. These foods tend to be high in fat, sodium, and added  sugar.  Shop around the outside edge of the grocery store. This includes fresh fruits and vegetables, bulk grains, fresh meats, and fresh dairy. Cooking  Use low-heat cooking methods, such as baking, instead of high-heat cooking methods like deep frying.  Cook using healthy oils, such as olive, canola, or sunflower oil.  Avoid cooking with butter, cream, or high-fat meats. Meal planning  Eat meals and snacks regularly, preferably at the same times every day. Avoid going long periods of time without eating.  Eat foods high in fiber, such as fresh fruits, vegetables, beans, and whole grains. Talk to your dietitian about how many servings of carbs you can eat at each meal.  Eat 4-6 ounces (oz) of lean protein each day, such as lean meat, chicken, fish, eggs, or tofu. One oz of lean protein is equal to: ? 1 oz of meat, chicken, or fish. ? 1 egg. ?  cup of tofu.  Eat some foods each day that contain healthy fats, such as avocado, nuts, seeds, and fish. Lifestyle  Check your blood glucose regularly.  Exercise regularly as told by your health care provider. This may include: ? 150 minutes of moderate-intensity or vigorous-intensity exercise each week. This could be brisk walking, biking, or water aerobics. ? Stretching and doing strength exercises, such as yoga or weightlifting, at least 2 times a week.  Take medicines as told by your health care provider.  Do not use any products that contain nicotine or tobacco, such as cigarettes and e-cigarettes. If you need help quitting, ask your health care provider.  Work with a Social worker or diabetes educator to identify strategies to manage stress and any emotional and social challenges. Questions to ask a health care provider  Do I need to meet with a diabetes educator?  Do I need to meet with a dietitian?  What number can I call if I have questions?  When are the best times to check  my blood glucose? Where to find more  information:  American Diabetes Association: diabetes.org  Academy of Nutrition and Dietetics: www.eatright.[employer] of Diabetes and Digestive and Kidney Diseases (NIH): [employer] Summary  A healthy meal plan will help you control your blood glucose and maintain a healthy lifestyle.  Working with a diet and nutrition specialist (dietitian) can help you make a meal plan that is best for you.  Keep in mind that carbohydrates (carbs) and alcohol have immediate effects on your blood glucose levels. It is important to count carbs and to use alcohol carefully. This information is not intended to replace advice given to you by your health care provider. Make sure you discuss any questions you have with your health care provider. Document Released: 11/26/2004 Document Revised: 02/11/2017 Document Reviewed: 04/05/2016 Elsevier Patient Education  2020 [employer].

## 2018-11-28 DIAGNOSIS — Z1211 Encounter for screening for malignant neoplasm of colon: Secondary | ICD-10-CM | POA: Diagnosis not present

## 2018-11-28 LAB — FACTOR 5 LEIDEN

## 2018-11-28 NOTE — Chronic Care Management (AMB) (Signed)
Chronic Care Management   Note  11/28/2018 Name: Ronald Malone MRN: [id number] DOB: 10-11-1955  Subjective:   Patient is a 63 year old male patient seeing [name], PA-C for primary care. Patient was referred to [facility] clinical pharmacy services for diabetes education, including diet and exercise. Patient seen at [facility] today with proper COVID 19 precautions taken.    Objective: Lab Results  Component Value Date   CREATININE 0.84 10/23/2018   CREATININE 0.90 10/18/2018   CREATININE 0.74 09/02/2018    Lab Results  Component Value Date   HGBA1C 6.9 (H) 10/18/2018    Lipid Panel     Component Value Date/Time   CHOL 145 10/18/2018 1052   TRIG 280 (H) 10/18/2018 1052   HDL 43 10/18/2018 1052   CHOLHDL 3.4 10/18/2018 1052   LDLCALC 46 10/18/2018 1052   LDLDIRECT 71 10/18/2018 1052    BP Readings from Last 3 Encounters:  11/23/18 130/72  11/14/18 (!) 147/85  11/14/18 (!) 161/89    No Known Allergies  Medications Reviewed Today    Reviewed by Cathi Roan, Daz[place] (Pharmacist) on 11/27/18 at 1008  Med List Status: <None>  Medication Order Taking? Sig Documenting Provider Last Dose Status Informant  apixaban (ELIQUIS) 5 MG TABS tablet [id number] Yes Take 5 mg by mouth 2 (two) times daily. [provider] Taking Active   atorvastatin (LIPITOR) 40 MG tablet [id number] Yes Take 40 mg by mouth daily. [provider] Taking Active   blood glucose meter kit and supplies [id number] Yes Dispense based on patient and insurance preference. Use once daily in the morning. (FOR ICD-10 E11.9). [name], PA-C Taking Active   nitroGLYCERIN (NITROSTAT) 0.4 MG SL tablet [id number] No PLACE 1 TABLET (0.4 MG TOTAL) UNDER THE TONGUE EVERY 5 (FIVE) MINUTES AS NEEDED FOR CHEST PAIN.  Patient not taking: Reported on 11/27/2018   [name], PA-C Not Taking Active            Assessment:   #DM: reviewed ADA diet/plate method and proper portion sizes, with  examples for each meal and snacks; Suggested patient drink more water or diet lemonade instead of coffee throughout the day, but patient declined; Patient appears to want pharmacist to outline an exercise regimen for him instead of taking a global approach to diabetes education. Patient thinks his main problem is the loss of feeling in legs; provided counseling on diabetic neuropathy as possible cause, and stressed the importance of maintaining control of BG to prevent future complications    Goals Addressed            This Visit's Progress    Exercise for Diabetes (pt-stated)       Current Barriers:   Knowledge Deficits related to basic Diabetes pathophysiology and self care/management   Case Manager Clinical Goal(s):  Over the next 60 days, patient will demonstrate improved adherence to prescribed treatment plan for diabetes self care/management as evidenced by:   daily monitoring and recording of CBG   adherence to ADA/ carb modified diet  exercise 3 days/week  adherence to prescribed medication regimen  Interventions:   Provided education to patient about basic DM disease process  Provided patient with written educational materials related to hypo and hyperglycemia and importance of correct treatment  Counseling on ADA diet  Silver Sneakers/gym benefits of [place] plan  Patient Self Care Activities:   Checks blood sugars as prescribed and utilize hyper and hypoglycemia protocol as needed  Adheres to prescribed ADA/carb modified  diet  Initial goal documentation        Plan:  Recommendations discussed with patient - consistent intake of protein and vegetables, decreasing carb intake at meals, drink more water instead of coffee  Follow up: Telephone follow up appointment with care management team member scheduled for: 2 weeks  Ruben Reason, PharmD Clinical Pharmacist [place] [phone]

## 2018-11-29 LAB — PROTHROMBIN GENE MUTATION

## 2018-12-06 ENCOUNTER — Other Ambulatory Visit: Payer: Self-pay | Admitting: Physician Assistant

## 2018-12-06 DIAGNOSIS — R079 Chest pain, unspecified: Secondary | ICD-10-CM

## 2018-12-07 ENCOUNTER — Other Ambulatory Visit: Payer: Self-pay

## 2018-12-07 ENCOUNTER — Telehealth: Payer: Self-pay | Admitting: Physician Assistant

## 2018-12-07 ENCOUNTER — Telehealth: Payer: Self-pay

## 2018-12-07 DIAGNOSIS — R195 Other fecal abnormalities: Secondary | ICD-10-CM

## 2018-12-07 LAB — COLOGUARD: Cologuard: POSITIVE — AB

## 2018-12-07 NOTE — Telephone Encounter (Signed)
Gastroenterology Pre-Procedure Review  Request Date: 12/26/18 Requesting Physician: Dr. Allen Norris  PATIENT REVIEW QUESTIONS: The patient responded to the following health history questions as indicated:    1. Are you having any GI issues? no Referral for Positive Cologuard 2. Do you have a personal history of Polyps? no 3. Do you have a family history of Colon Cancer or Polyps? no 4. Diabetes Mellitus? yes (not taking meds for diabetes at this time) 5. Joint replacements in the past 12 months?no 6. Major health problems in the past 3 months?Angioplasty Right Leg08/10/20 7. Any artificial heart valves, MVP, or defibrillator?no    MEDICATIONS & ALLERGIES:    Patient reports the following regarding taking any anticoagulation/antiplatelet therapy:   Plavix, Coumadin, Eliquis, Xarelto, Lovenox, Pradaxa, Brilinta, or Effient? yes (Eliquis Blood Thinner sent to Dr. Lucky Cowboy) Aspirin? no  Patient confirms/reports the following medications:  Current Outpatient Medications  Medication Sig Dispense Refill  . apixaban (ELIQUIS) 5 MG TABS tablet Take 5 mg by mouth 2 (two) times daily.    Marland Kitchen atorvastatin (LIPITOR) 40 MG tablet Take 40 mg by mouth daily.    . blood glucose meter kit and supplies Dispense based on patient and insurance preference. Use once daily in the morning. (FOR ICD-10 E11.9). 1 each 0  . nitroGLYCERIN (NITROSTAT) 0.4 MG SL tablet PLACE 1 TABLET (0.4 MG TOTAL) UNDER THE TONGUE EVERY 5 (FIVE) MINUTES AS NEEDED FOR CHEST PAIN. 100 tablet 0   No current facility-administered medications for this visit.     Patient confirms/reports the following allergies:  No Known Allergies  No orders of the defined types were placed in this encounter.   AUTHORIZATION INFORMATION Primary Insurance: 1D#: Group #:  Secondary Insurance: 1D#: Group #:  SCHEDULE INFORMATION: Date: 12/26/18 Time: Location:[facility]

## 2018-12-07 NOTE — Telephone Encounter (Signed)
Patient advised as below.  

## 2018-12-07 NOTE — Telephone Encounter (Signed)
Can we abstract cologuard and call patient about positive results? He will need a colonoscopy.

## 2018-12-08 ENCOUNTER — Telehealth: Payer: Self-pay

## 2018-12-08 NOTE — Telephone Encounter (Signed)
[employer] called to notify you that the patient's cologuard results are positive. They faxed over a copy of the results.

## 2018-12-08 NOTE — Telephone Encounter (Signed)
He has been notified and referred to GI.

## 2018-12-10 ENCOUNTER — Encounter: Payer: Self-pay | Admitting: Physician Assistant

## 2018-12-11 ENCOUNTER — Ambulatory Visit: Payer: Self-pay | Admitting: Pharmacist

## 2018-12-11 ENCOUNTER — Telehealth: Payer: Self-pay

## 2018-12-11 DIAGNOSIS — G629 Polyneuropathy, unspecified: Secondary | ICD-10-CM

## 2018-12-11 DIAGNOSIS — E119 Type 2 diabetes mellitus without complications: Secondary | ICD-10-CM

## 2018-12-11 NOTE — Chronic Care Management (AMB) (Signed)
  Chronic Care Management   Note  12/11/2018 Name: Ronald Malone MRN: [id number] DOB: 07-04-1955  63 y.o. year old male referred to Chronic Care Management by [name], PA-C for DM education.Last office visit with CCM clinical pharmacist on 11/27/2018.   Was unable to reach patient via telephone today and have left HIPAA compliant voicemail asking patient to return my call. (unsuccessful outreach #1).  Follow up plan: A HIPPA compliant phone message was left for the patient providing contact information and requesting a return call.  The care management team will reach out to the patient again over the next 5-7 days.   Ruben Reason, PharmD Clinical Pharmacist [place] [phone]

## 2018-12-13 ENCOUNTER — Telehealth: Payer: Self-pay

## 2018-12-13 NOTE — Telephone Encounter (Signed)
LVM advising patient to stop Eliquis per Dr. Bunnie Domino blood thinner advice received on 12/12/18.  Advised patient to stop Eliquis 2 days prior to his colonoscopy scheduled on 12/26/18.  Thanks [employer]

## 2018-12-18 ENCOUNTER — Telehealth: Payer: Self-pay

## 2018-12-18 ENCOUNTER — Ambulatory Visit: Payer: Self-pay | Admitting: Pharmacist

## 2018-12-18 LAB — HM DIABETES EYE EXAM

## 2018-12-19 NOTE — Chronic Care Management (AMB) (Signed)
  Chronic Care Management   Note  12/19/2018 Name: Ronald Malone MRN: [id number] DOB: 09/15/1955  63 y.o. year old male referred to Chronic Care Management by [name], PA-C for DM education.Last office visit with CCM clinical pharmacist on 11/27/2018.   Was unable to reach patient via telephone today and have left HIPAA compliant voicemail asking patient to return my call. (unsuccessful outreach #2).  Follow up plan: A HIPPA compliant phone message was left for the patient providing contact information and requesting a return call.  The care management team will reach out to the patient again over the next 5-7 days.   Ruben Reason, PharmD Clinical Pharmacist [place] [phone]

## 2018-12-22 ENCOUNTER — Other Ambulatory Visit: Payer: Self-pay

## 2018-12-22 ENCOUNTER — Telehealth: Payer: Self-pay | Admitting: Gastroenterology

## 2018-12-22 ENCOUNTER — Other Ambulatory Visit
Admission: RE | Admit: 2018-12-22 | Discharge: 2018-12-22 | Disposition: A | Discharge status: Home / Self Care | Payer: Medicare Other | Source: Ambulatory Visit | Attending: Gastroenterology | Admitting: Gastroenterology

## 2018-12-22 DIAGNOSIS — Z01812 Encounter for preprocedural laboratory examination: Secondary | ICD-10-CM | POA: Insufficient documentation

## 2018-12-22 DIAGNOSIS — Z20828 Contact with and (suspected) exposure to other viral communicable diseases: Secondary | ICD-10-CM | POA: Insufficient documentation

## 2018-12-22 LAB — SARS CORONAVIRUS 2 (TAT 6-24 HRS): SARS Coronavirus 2: NEGATIVE

## 2018-12-22 NOTE — Telephone Encounter (Signed)
Pt left vm he has a procedure on Tuesday and his package with instructions states his prep is included and he does not see it included please call pt

## 2018-12-22 NOTE — Telephone Encounter (Signed)
Patient stopped by the office.  SuPrep sample was provided for his inconvenience.  Thanks [employer]

## 2018-12-23 ENCOUNTER — Other Ambulatory Visit: Payer: Self-pay | Admitting: Physician Assistant

## 2018-12-23 DIAGNOSIS — R079 Chest pain, unspecified: Secondary | ICD-10-CM

## 2018-12-25 ENCOUNTER — Telehealth: Payer: Self-pay | Admitting: Gastroenterology

## 2018-12-25 ENCOUNTER — Encounter: Payer: Self-pay | Admitting: *Deleted

## 2018-12-25 ENCOUNTER — Telehealth: Payer: Self-pay

## 2018-12-25 ENCOUNTER — Encounter: Payer: Self-pay | Admitting: Physician Assistant

## 2018-12-25 ENCOUNTER — Encounter: Payer: Self-pay | Admitting: Pharmacist

## 2018-12-25 NOTE — Telephone Encounter (Signed)
Called pt and discussed his sharp back pain. Advised pt this is not a GI issue and he really needs to make an appt with his PCP to discuss. I advised pt if pain gets worse even while on the ibuprofen, he should have it evaluated. He is worried his has a kidney stone. He said he has been having trouble urinating lately. He stated he does strain a lot when trying to have a bowel movement but the back pain started on Friday before his straining bowel movement.

## 2018-12-25 NOTE — Telephone Encounter (Signed)
Pt is having a procedure tomorrow   And is having a sharp pain on his back on the left side he has been taking IB profin  To kill the pain he would like a call please  cb [phone]

## 2018-12-25 NOTE — Progress Notes (Signed)
This encounter was created in error - please disregard.

## 2018-12-26 ENCOUNTER — Ambulatory Visit: Payer: Medicare Other | Admitting: Anesthesiology

## 2018-12-26 ENCOUNTER — Ambulatory Visit
Admission: RE | Admit: 2018-12-26 | Discharge: 2018-12-26 | Disposition: A | Discharge status: Home / Self Care | Payer: Medicare Other | Attending: Gastroenterology | Admitting: Gastroenterology

## 2018-12-26 ENCOUNTER — Encounter
Admission: RE | Disposition: A | Discharge status: Home / Self Care | Payer: Self-pay | Source: Home / Self Care | Attending: Gastroenterology

## 2018-12-26 ENCOUNTER — Other Ambulatory Visit: Payer: Self-pay

## 2018-12-26 DIAGNOSIS — J449 Chronic obstructive pulmonary disease, unspecified: Secondary | ICD-10-CM | POA: Diagnosis not present

## 2018-12-26 DIAGNOSIS — I252 Old myocardial infarction: Secondary | ICD-10-CM | POA: Diagnosis not present

## 2018-12-26 DIAGNOSIS — R195 Other fecal abnormalities: Secondary | ICD-10-CM

## 2018-12-26 DIAGNOSIS — Z7901 Long term (current) use of anticoagulants: Secondary | ICD-10-CM | POA: Insufficient documentation

## 2018-12-26 DIAGNOSIS — Z955 Presence of coronary angioplasty implant and graft: Secondary | ICD-10-CM | POA: Diagnosis not present

## 2018-12-26 DIAGNOSIS — I509 Heart failure, unspecified: Secondary | ICD-10-CM | POA: Diagnosis not present

## 2018-12-26 DIAGNOSIS — Z86711 Personal history of pulmonary embolism: Secondary | ICD-10-CM | POA: Diagnosis not present

## 2018-12-26 DIAGNOSIS — F17210 Nicotine dependence, cigarettes, uncomplicated: Secondary | ICD-10-CM | POA: Diagnosis not present

## 2018-12-26 DIAGNOSIS — Z539 Procedure and treatment not carried out, unspecified reason: Secondary | ICD-10-CM | POA: Diagnosis not present

## 2018-12-26 DIAGNOSIS — I2510 Atherosclerotic heart disease of native coronary artery without angina pectoris: Secondary | ICD-10-CM | POA: Insufficient documentation

## 2018-12-26 HISTORY — PX: COLONOSCOPY WITH PROPOFOL: SHX5780

## 2018-12-26 LAB — GLUCOSE, CAPILLARY: Glucose-Capillary: 144 mg/dL — ABNORMAL HIGH (ref 70–99)

## 2018-12-26 SURGERY — COLONOSCOPY WITH PROPOFOL
Anesthesia: General

## 2018-12-26 MED ORDER — SODIUM CHLORIDE 0.9 % IV SOLN
INTRAVENOUS | Status: DC
  Administered 2018-12-26: 09:00:00 via INTRAVENOUS

## 2018-12-26 MED ORDER — PROPOFOL 500 MG/50ML IV EMUL
INTRAVENOUS | Status: AC
  Filled 2018-12-26: qty 50

## 2018-12-26 MED ORDER — PROPOFOL 500 MG/50ML IV EMUL
INTRAVENOUS | Status: DC | PRN
  Administered 2018-12-26: 150 ug/kg/min via INTRAVENOUS

## 2018-12-26 MED ORDER — PROPOFOL 10 MG/ML IV BOLUS
INTRAVENOUS | Status: DC | PRN
  Administered 2018-12-26: 50 mg via INTRAVENOUS

## 2018-12-26 MED ORDER — PROPOFOL 10 MG/ML IV BOLUS
INTRAVENOUS | Status: AC
  Filled 2018-12-26: qty 40

## 2018-12-26 NOTE — Anesthesia Preprocedure Evaluation (Signed)
Anesthesia Evaluation  Patient identified by MRN, date of birth, ID band Patient awake    Reviewed: Allergy & Precautions, NPO status , Patient's Chart, lab work & pertinent test results  History of Anesthesia Complications Negative for: history of anesthetic complications  Airway Mallampati: II  TM Distance: >3 FB Neck ROM: Full    Dental  (+) Partial Lower   Pulmonary COPD, Current SmokerPatient did not abstain from smoking.,    breath sounds clear to auscultation- rhonchi (-) wheezing      Cardiovascular + CAD, + Past MI and +CHF (preserved EF)  (-) Cardiac Stents and (-) CABG  Rhythm:Regular Rate:Normal - Systolic murmurs and - Diastolic murmurs Echo [id number]: 1. The left ventricle has normal systolic function with an ejection fraction of 60-65%. The cavity size was normal. There is mildly increased left ventricular wall thickness. Left ventricular diastolic Doppler parameters are consistent with  pseudonormalization.  2. The right ventricle has normal systolic function. The cavity was normal. There is no increase in right ventricular wall thickness.  3. The aortic valve is tricuspid. Aortic valve regurgitation was not assessed by color flow Doppler.   Neuro/Psych neg Seizures negative neurological ROS  negative psych ROS   GI/Hepatic negative GI ROS, Neg liver ROS,   Endo/Other  negative endocrine ROSneg diabetes  Renal/GU negative Renal ROS     Musculoskeletal negative musculoskeletal ROS (+)   Abdominal (+) + obese,   Peds  Hematology negative hematology ROS (+)   Anesthesia Other Findings Past Medical History: No date: CHF (congestive heart failure) (HCC) No date: Clotting disorder (HCC) No date: MI (myocardial infarction) ([place]) No date: Pulmonary embolism (HCC)   Reproductive/Obstetrics                             Anesthesia Physical Anesthesia Plan  ASA: III  Anesthesia  Plan: General   Post-op Pain Management:    Induction: Intravenous  PONV Risk Score and Plan: 0 and Propofol infusion  Airway Management Planned: Natural Airway  Additional Equipment:   Intra-op Plan:   Post-operative Plan:   Informed Consent: I have reviewed the patients History and Physical, chart, labs and discussed the procedure including the risks, benefits and alternatives for the proposed anesthesia with the patient or authorized representative who has indicated his/her understanding and acceptance.     Dental advisory given  Plan Discussed with: CRNA and Anesthesiologist  Anesthesia Plan Comments:         Anesthesia Quick Evaluation

## 2018-12-26 NOTE — Anesthesia Post-op Follow-up Note (Signed)
Anesthesia QCDR form completed.        

## 2018-12-26 NOTE — Anesthesia Postprocedure Evaluation (Signed)
Anesthesia Post Note  Patient: Ronald Malone  Procedure(s) Performed: COLONOSCOPY WITH PROPOFOL (N/A )  Patient location during evaluation: Endoscopy Anesthesia Type: General Level of consciousness: awake and alert and oriented Pain management: pain level controlled Vital Signs Assessment: post-procedure vital signs reviewed and stable Respiratory status: spontaneous breathing, nonlabored ventilation and respiratory function stable Cardiovascular status: blood pressure returned to baseline and stable Postop Assessment: no signs of nausea or vomiting Anesthetic complications: no     Last Vitals:  Vitals:   12/26/18 0953 12/26/18 0958  BP: (!) 93/43   Pulse: 69 68  Resp: 16 (!) 27  Temp: (!) 36.4 C   SpO2: 95% 95%    Last Pain:  Vitals:   12/26/18 0952  TempSrc:   PainSc: 0-No pain                 Daniyal Tabor

## 2018-12-26 NOTE — H&P (Signed)
Lucilla Lame, MD [address] [place], [place] [address] Phone:[phone] Fax : [phone]  Primary Care Physician:  Paulene Floor Primary Gastroenterologist:  Dr. Allen Norris  Pre-Procedure History & Physical: HPI:  Ronald Malone is a 63 y.o. male is here for an colonoscopy.   Past Medical History:  Diagnosis Date  . CHF (congestive heart failure) ([place])   . Clotting disorder ([place])   . MI (myocardial infarction) ([place])   . Pulmonary embolism [facility])     Past Surgical History:  Procedure Laterality Date  . CARDIAC CATHETERIZATION    . COLON SURGERY     1/3 colon removed as an infant  . LOWER EXTREMITY ANGIOGRAPHY Right 10/23/2018   Procedure: LOWER EXTREMITY ANGIOGRAPHY;  Surgeon: Algernon Huxley, MD;  Location: [facility];  Service: Cardiovascular;  Laterality: Right;    Prior to Admission medications   Medication Sig Start Date End Date Taking? Authorizing Provider  apixaban (ELIQUIS) 5 MG TABS tablet Take 5 mg by mouth 2 (two) times daily.    [provider]  atorvastatin (LIPITOR) 40 MG tablet Take 40 mg by mouth daily. 10/12/17   [provider]  blood glucose meter kit and supplies Dispense based on patient and insurance preference. Use once daily in the morning. (FOR ICD-10 E11.9). 11/10/18   [name], PA-C  nitroGLYCERIN (NITROSTAT) 0.4 MG SL tablet PLACE 1 TABLET (0.4 MG TOTAL) UNDER THE TONGUE EVERY 5 (FIVE) MINUTES AS NEEDED FOR CHEST PAIN. Patient not taking: Reported on 12/26/2018 12/25/18 03/25/19  [name], PA-C    Allergies as of 12/07/2018  . (No Known Allergies)    Family History  Problem Relation Age of Onset  . Parkinson's disease Mother   . Dementia Father     Social History   Socioeconomic History  . Marital status: Married    Spouse name: [name]   . Number of children: 2  . Years of education: Not on file  . Highest education level: Not on file  Occupational History  .  Occupation: RETIRED   Social Needs  . Financial resource strain: Not hard at all  . Food insecurity    Worry: Never true    Inability: Never true  . Transportation needs    Medical: No    Non-medical: No  Tobacco Use  . Smoking status: Current Every Day Smoker    Packs/day: 3.00    Years: 45.00    Pack years: 135.00    Types: Cigarettes    Last attempt to quit: 09/15/2018    Years since quitting: 0.2  . Smokeless tobacco: Never Used  Substance and Sexual Activity  . Alcohol use: Yes  . Drug use: Yes    Types: Psilocybin  . Sexual activity: Not on file  Lifestyle  . Physical activity    Days per week: Not on file    Minutes per session: Not on file  . Stress: Only a little  Relationships  . Social connections    Talks on phone: More than three times a week    Gets together: Not on file    Attends religious service: Not on file    Active member of club or organization: Not on file    Attends meetings of clubs or organizations: Not on file    Relationship status: Not on file  . Intimate partner violence    Fear of current or ex partner: Not on file    Emotionally abused:  No    Physically abused: No    Forced sexual activity: No  Other Topics Concern  . Not on file  Social History Narrative  . Not on file    Review of Systems: See HPI, otherwise negative ROS  Physical Exam: BP 138/64   Pulse 81   Temp (!) 96.9 F (36.1 C) (Tympanic)   Resp 20   Ht 6' (1.829 m)   Wt (!) 143.8 kg   BMI 42.99 kg/m  General:   Alert,  pleasant and cooperative in NAD Head:  Normocephalic and atraumatic. Neck:  Supple; no masses or thyromegaly. Lungs:  Clear throughout to auscultation.    Heart:  Regular rate and rhythm. Abdomen:  Soft, nontender and nondistended. Normal bowel sounds, without guarding, and without rebound.   Neurologic:  Alert and  oriented x4;  grossly normal neurologically.  Impression/Plan: Ronald Malone is here for an colonoscopy to be performed for  positive cologard  Risks, benefits, limitations, and alternatives regarding  colonoscopy have been reviewed with the patient.  Questions have been answered.  All parties agreeable.   Lucilla Lame, MD  12/26/2018, 8:59 AM

## 2018-12-26 NOTE — Op Note (Signed)
[facility] Patient Name: Ronald Malone Procedure Date: 12/26/2018 9:26 AM MRN: [id number] Account #: [id number] Date of Birth: 1955/04/03 Admit Type: Outpatient Age: 63 Room: [facility] ENDO ROOM 4 Gender: Male Note Status: Finalized Procedure:            Colonoscopy Indications:          Positive Cologuard test Providers:            Lucilla Lame MD, MD Medicines:            Propofol per Anesthesia Complications:        No immediate complications. Procedure:            Pre-Anesthesia Assessment:                       - Prior to the procedure, a History and Physical was                        performed, and patient medications and allergies were                        reviewed. The patient's tolerance of previous                        anesthesia was also reviewed. The risks and benefits of                        the procedure and the sedation options and risks were                        discussed with the patient. All questions were                        answered, and informed consent was obtained. Prior                        Anticoagulants: The patient has taken Eliquis                        (apixaban), last dose was 4 days prior to procedure.                        ASA Grade Assessment: III - A patient with severe                        systemic disease. After reviewing the risks and                        benefits, the patient was deemed in satisfactory                        condition to undergo the procedure.                       After obtaining informed consent, the colonoscope was                        passed under direct vision. Throughout the procedure,                        the patient's  blood pressure, pulse, and oxygen                        saturations were monitored continuously. The                        Colonoscope was introduced through the anus and                        advanced to the the sigmoid colon. The colonoscopy was                         performed without difficulty. The patient tolerated the                        procedure well. The quality of the bowel preparation                        was poor. Findings:      The perianal and digital rectal examinations were normal.      A large amount of solid stool was found in the sigmoid colon, precluding       visualization. Impression:           - Preparation of the colon was poor.                       - Stool in the sigmoid colon.                       - No specimens collected. Recommendation:       - Discharge patient to home.                       - Resume previous diet.                       - Continue present medications.                       - Repeat colonoscopy because the bowel preparation was                        poor. Procedure Code(s):    --- Professional ---                       [phone], 54, Colonoscopy, flexible; diagnostic, including                        collection of specimen(s) by brushing or washing, when                        performed (separate procedure) Diagnosis Code(s):    --- Professional ---                       R19.5, Other fecal abnormalities CPT copyright 2019 American Medical Association. All rights reserved. The codes documented in this report are preliminary and upon coder review may  be revised to meet current compliance requirements. Lucilla Lame MD, MD 12/26/2018 9:46:06 AM This report has been signed electronically. Number of Addenda: 0 Note Initiated On: 12/26/2018 9:26 AM Total Procedure Duration: 0 hours 1 minute 53 seconds  Estimated Blood Loss: Estimated blood loss: none.      [facility]

## 2018-12-26 NOTE — Transfer of Care (Signed)
Immediate Anesthesia Transfer of Care Note  Patient: Ronald Malone  Procedure(s) Performed: COLONOSCOPY WITH PROPOFOL (N/A )  Patient Location: PACU  Anesthesia Type:General  Level of Consciousness: sedated  Airway & Oxygen Therapy: Patient Spontanous Breathing  Post-op Assessment: Report given to RN and Post -op Vital signs reviewed and stable  Post vital signs: Reviewed and stable  Last Vitals:  Vitals Value Taken Time  BP 93/43 12/26/18 0953  Temp 36.4 C 12/26/18 0953  Pulse 71 12/26/18 0953  Resp 15 12/26/18 0953  SpO2 96 % 12/26/18 0953  Vitals shown include unvalidated device data.  Last Pain:  Vitals:   12/26/18 0832  TempSrc: Tympanic  PainSc: 0-No pain         Complications: No apparent anesthesia complications

## 2018-12-27 ENCOUNTER — Telehealth: Payer: Self-pay

## 2018-12-27 ENCOUNTER — Encounter: Payer: Self-pay | Admitting: Gastroenterology

## 2018-12-27 NOTE — Telephone Encounter (Signed)
Patient colonoscopy prep was poor. Per Dr. Allen Norris he wanted him to repeat his colonoscopy. Called and left a message for patient and sent a mychart message

## 2018-12-28 ENCOUNTER — Telehealth: Payer: Self-pay

## 2018-12-28 ENCOUNTER — Encounter: Payer: Self-pay | Admitting: Physician Assistant

## 2018-12-28 ENCOUNTER — Ambulatory Visit: Payer: Self-pay | Admitting: Pharmacist

## 2018-12-28 DIAGNOSIS — G629 Polyneuropathy, unspecified: Secondary | ICD-10-CM

## 2018-12-28 DIAGNOSIS — E119 Type 2 diabetes mellitus without complications: Secondary | ICD-10-CM

## 2018-12-28 NOTE — Chronic Care Management (AMB) (Signed)
  Chronic Care Management   Note  12/28/2018 Name: Ronald Malone MRN: [id number] DOB: 04/10/1955  63 y.o. year old male referred to Chronic Care Management team for diabetes education by [name], PA-C.   CCM team services are being closed due to three unsuccessful outreach attempts.   Patient has been provided CCM contact information if he/she wishes to engage with care managers in the future.    Ruben Reason, PharmD Clinical Pharmacist [place] [phone]

## 2019-01-01 DIAGNOSIS — H90A11 Conductive hearing loss, unilateral, right ear with restricted hearing on the contralateral side: Secondary | ICD-10-CM | POA: Diagnosis not present

## 2019-01-01 DIAGNOSIS — H6531 Chronic mucoid otitis media, right ear: Secondary | ICD-10-CM | POA: Diagnosis not present

## 2019-01-01 NOTE — Telephone Encounter (Signed)
Pt sent a mychart message back and requested a follow up appt to discuss constipation issues prior to rescheduling colonoscopy.

## 2019-01-02 ENCOUNTER — Other Ambulatory Visit: Payer: Self-pay

## 2019-01-02 ENCOUNTER — Ambulatory Visit (INDEPENDENT_AMBULATORY_CARE_PROVIDER_SITE_OTHER): Payer: Medicare Other

## 2019-01-02 ENCOUNTER — Ambulatory Visit: Payer: Medicare Other

## 2019-01-02 DIAGNOSIS — Z23 Encounter for immunization: Secondary | ICD-10-CM | POA: Diagnosis not present

## 2019-01-05 DIAGNOSIS — H90A11 Conductive hearing loss, unilateral, right ear with restricted hearing on the contralateral side: Secondary | ICD-10-CM | POA: Diagnosis not present

## 2019-01-10 DIAGNOSIS — H905 Unspecified sensorineural hearing loss: Secondary | ICD-10-CM | POA: Diagnosis not present

## 2019-01-11 ENCOUNTER — Other Ambulatory Visit: Payer: Self-pay

## 2019-01-11 NOTE — Patient Outreach (Signed)
[facility]) Care Management  01/11/2019  Ronald Malone 03/28/1955 [id number]   Medication Adherence call to Ronald Malone HIPPA Compliant Voice message left with a call back number. Ronald Malone is showing past due on Atorvastatin 40 mg under [place].   [facility] Management Direct Dial [phone]  Fax [phone] [email or web address]

## 2019-01-18 ENCOUNTER — Ambulatory Visit: Payer: Medicare Other | Admitting: Gastroenterology

## 2019-01-18 ENCOUNTER — Ambulatory Visit (INDEPENDENT_AMBULATORY_CARE_PROVIDER_SITE_OTHER): Payer: Medicare Other | Admitting: Gastroenterology

## 2019-01-18 ENCOUNTER — Encounter: Payer: Self-pay | Admitting: Gastroenterology

## 2019-01-18 ENCOUNTER — Other Ambulatory Visit: Payer: Self-pay

## 2019-01-18 VITALS — BP 128/74 | HR 78 | Temp 97.5°F | Ht 73.0 in | Wt 336.0 lb

## 2019-01-18 DIAGNOSIS — K5904 Chronic idiopathic constipation: Secondary | ICD-10-CM

## 2019-01-18 DIAGNOSIS — H40003 Preglaucoma, unspecified, bilateral: Secondary | ICD-10-CM | POA: Diagnosis not present

## 2019-01-18 LAB — HM DIABETES EYE EXAM

## 2019-01-18 NOTE — Progress Notes (Signed)
Primary Care Physician: Paulene Floor  Primary Gastroenterologist:  Dr. Lucilla Lame  Chief Complaint  Patient presents with  . Constipation    HPI: Ronald Malone is a 63 y.o. male here after having a colonoscopy on October 13 that was canceled due to a poor prep.  The patient reports that he has had constipation.  The patient was contacted to reschedule the colonoscopy and stated that he wanted to talk in the office about his constipation before he rescheduled the colonoscopy. The patient reports that he has not had a bowel movement since the colonoscopy on the 13th.  He denies any abdominal pain but states he did feel some rumbling in his stomach yesterday which usually equates to him having a bowel movement today.  There is no report of any abdominal pain just some bloating.  He also reports he has been constipated most of his life.  When he does have a bowel movement he states that it is usually hard and very large and causes him to bleed from the rectum.  Current Outpatient Medications  Medication Sig Dispense Refill  . apixaban (ELIQUIS) 5 MG TABS tablet Take 5 mg by mouth 2 (two) times daily.    Marland Kitchen atorvastatin (LIPITOR) 40 MG tablet Take 40 mg by mouth daily.    . blood glucose meter kit and supplies Dispense based on patient and insurance preference. Use once daily in the morning. (FOR ICD-10 E11.9). 1 each 0  . ciprofloxacin-dexamethasone (CIPRODEX) OTIC suspension 4 DROPS TO RIGHT EAR 2 TIMES DAILY X 7 DAYS    . nitroGLYCERIN (NITROSTAT) 0.4 MG SL tablet PLACE 1 TABLET (0.4 MG TOTAL) UNDER THE TONGUE EVERY 5 (FIVE) MINUTES AS NEEDED FOR CHEST PAIN. (Patient not taking: Reported on 12/26/2018) 300 tablet 1   No current facility-administered medications for this visit.     Allergies as of 01/18/2019  . (No Known Allergies)    ROS:  General: Negative for anorexia, weight loss, fever, chills, fatigue, weakness. ENT: Negative for hoarseness, difficulty swallowing  , nasal congestion. CV: Negative for chest pain, angina, palpitations, dyspnea on exertion, peripheral edema.  Respiratory: Negative for dyspnea at rest, dyspnea on exertion, cough, sputum, wheezing.  GI: See history of present illness. GU:  Negative for dysuria, hematuria, urinary incontinence, urinary frequency, nocturnal urination.  Endo: Negative for unusual weight change.    Physical Examination:   BP 128/74   Pulse 78   Temp (!) 97.5 F (36.4 C) (Temporal)   Ht _0  (1.854 m)   Wt (!) 336 lb (152.4 kg)   BMI 44.33 kg/m   General: Well-nourished, well-developed in no acute distress.  Eyes: No icterus. Conjunctivae pink. Lungs: Clear to auscultation bilaterally. Non-labored. Heart: Regular rate and rhythm, no murmurs rubs or gallops.  Abdomen: Bowel sounds are normal, nontender, nondistended, no hepatosplenomegaly or masses, no abdominal bruits or hernia , no rebound or guarding.   Extremities: No lower extremity edema. No clubbing or deformities. Neuro: Alert and oriented x 3.  Grossly intact. Skin: Warm and dry, no jaundice.   Psych: Alert and cooperative, normal mood and affect.  Labs:    Imaging Studies: No results found.  Assessment and Plan:   Ronald Malone is a 63 y.o. y/o male who comes in today after having a colonoscopy with it was canceled due to a poor prep.  The patient has been given samples of Linzess and he will take this every day to see if he can  start having more frequent bowel movements.  When the patient is more stable from a bowel movement point of view then he will be prepped for colonoscopy.  The patient has been told to call our office when he is going on a regular basis.  If he is not that he will call us and we will need to modify his medication.  The patient has been explained the plan and agrees with it.     Lucilla Lame, MD. [facility]    Note: This dictation was prepared with Dragon dictation along with smaller phrase technology. Any  transcriptional errors that result from this process are unintentional.

## 2019-02-13 NOTE — Progress Notes (Signed)
Patient: Ronald Malone Male    DOB: 01-26-1956   63 y.o.   MRN: [id number] Visit Date: 02/14/2019  Today's Provider: [name], PA-C   Chief Complaint  Patient presents with  . Diabetes   Subjective:     HPI  Diabetes Mellitus Type II, Follow-up:   Lab Results  Component Value Date   HGBA1C 7.5 (A) 02/14/2019   HGBA1C 6.9 (H) 10/18/2018    Last seen for diabetes 3 months ago.  Management since then includes diet management. He reports good compliance with treatment. He is not having side effects.  Current symptoms include none and have been stable. Home blood sugar records: fasting range: not checked.   Episodes of hypoglycemia? no   Current insulin regiment: Is not on insulin Most Recent Eye Exam: 01/18/2019 [place] Weight trend: stable Prior visit with dietician: No Current exercise: none Current diet habits: well balanced  Pertinent Labs:    Component Value Date/Time   CHOL 145 10/18/2018 1052   TRIG 280 (H) 10/18/2018 1052   HDL 43 10/18/2018 1052   LDLCALC 46 10/18/2018 1052   CREATININE 0.84 10/23/2018 0842    Wt Readings from Last 3 Encounters:  02/14/19 (!) 333 lb (151 kg)  01/18/19 (!) 336 lb (152.4 kg)  12/26/18 (!) 317 lb (143.8 kg)   Constipation: He had a colonoscopy 12/2018 that was insufficient due to poor prep. Patient reports he has always had issues with constipation and would normally go once a week. He has been given Linzess by Dr. Allen Norris. He is currently taking Linzess 290 mcg daily which he thinks may have made bowel movement slightly easier.   Peripheral Neuropathy: Patient reports burning sensation in lower extremities. He also endorses low back pain and burning after standing in certain positions.   Elevated Blood Pressure:  He has an upcoming appointment with Dr. Clayborn Bigness on 03/05/2019. He is not treated for HTN.  ------------------------------------------------------------------------    No Known  Allergies   Current Outpatient Medications:  .  apixaban (ELIQUIS) 5 MG TABS tablet, Take 5 mg by mouth 2 (two) times daily., Disp: , Rfl:  .  atorvastatin (LIPITOR) 40 MG tablet, Take 40 mg by mouth daily., Disp: , Rfl:  .  blood glucose meter kit and supplies, Dispense based on patient and insurance preference. Use once daily in the morning. (FOR ICD-10 E11.9)., Disp: 1 each, Rfl: 0 .  ciprofloxacin-dexamethasone (CIPRODEX) OTIC suspension, 4 DROPS TO RIGHT EAR 2 TIMES DAILY X 7 DAYS, Disp: , Rfl:  .  gabapentin (NEURONTIN) 300 MG capsule, Take 1 capsule (300 mg total) by mouth at bedtime., Disp: 90 capsule, Rfl: 1 .  metFORMIN (GLUCOPHAGE XR) 500 MG 24 hr tablet, Take 1 tablet (500 mg total) by mouth 2 (two) times daily., Disp: 180 tablet, Rfl: 0 .  nitroGLYCERIN (NITROSTAT) 0.4 MG SL tablet, PLACE 1 TABLET (0.4 MG TOTAL) UNDER THE TONGUE EVERY 5 (FIVE) MINUTES AS NEEDED FOR CHEST PAIN. (Patient not taking: Reported on 12/26/2018), Disp: 300 tablet, Rfl: 1  Review of Systems  Constitutional: Negative.   Respiratory: Negative.   Neurological: Negative.   Psychiatric/Behavioral: Negative.     Social History   Tobacco Use  . Smoking status: Current Every Day Smoker    Packs/day: 3.00    Years: 45.00    Pack years: 135.00    Types: Cigarettes    Last attempt to quit: 09/15/2018    Years since quitting: 0.4  .  Smokeless tobacco: Never Used  Substance Use Topics  . Alcohol use: Yes      Objective:   BP (!) 147/78 (BP Location: Left Arm, Patient Position: Sitting, Cuff Size: Large)   Pulse 83   Temp (!) 97.1 F (36.2 C) (Temporal)   Wt (!) 333 lb (151 kg)   BMI 43.93 kg/m  Vitals:   02/14/19 0811  BP: (!) 147/78  Pulse: 83  Temp: (!) 97.1 F (36.2 C)  TempSrc: Temporal  Weight: (!) 333 lb (151 kg)  Body mass index is 43.93 kg/m.   Physical Exam Constitutional:      Appearance: Normal appearance. He is obese.  Cardiovascular:     Rate and Rhythm: Normal rate and  regular rhythm.     Heart sounds: Normal heart sounds.  Pulmonary:     Effort: Pulmonary effort is normal.     Breath sounds: Normal breath sounds.  Skin:    General: Skin is warm and dry.  Neurological:     Mental Status: He is alert and oriented to person, place, and time. Mental status is at baseline.  Psychiatric:        Mood and Affect: Mood normal.        Behavior: Behavior normal.      Results for orders placed or performed in visit on 02/14/19  POCT HgB A1C  Result Value Ref Range   Hemoglobin A1C 7.5 (A) 4.0 - 5.6 %   HbA1c POC (<> result, manual entry)     HbA1c, POC (prediabetic range)     HbA1c, POC (controlled diabetic range)     Est. average glucose Bld gHb Est-mCnc 169        Assessment & Plan    1. Diabetes mellitus without complication (HCC)  G8B increased to 7.5%. Start medication as below. Counseled on weight loss and resources like CDC diabetes and American Diabetes Association. F/u 3 months for diabetes.   - POCT HgB A1C - metFORMIN (GLUCOPHAGE XR) 500 MG 24 hr tablet; Take 1 tablet (500 mg total) by mouth 2 (two) times daily.  Dispense: 180 tablet; Refill: 0  2. Chronic idiopathic constipation  Taking 290 mcg linzess. He will start keeping track of bowel movements. He reports he may have to do 2 day cleanout for upcoming colonoscopy.   3. Bilateral low back pain without sciatica, unspecified chronicity  May consider xray in the future. Start gabapentin as below.  - DG Lumbar Spine Complete; Future  4. Other polyneuropathy  - gabapentin (NEURONTIN) 300 MG capsule; Take 1 capsule (300 mg total) by mouth at bedtime.  Dispense: 90 capsule; Refill: 1  The entirety of the information documented in the History of Present Illness, Review of Systems and Physical Exam were personally obtained by me. Portions of this information were initially documented by [employer] [facility], CMA and reviewed by me for thoroughness and accuracy.          [name], PA-C  [facility]

## 2019-02-14 ENCOUNTER — Encounter: Payer: Self-pay | Admitting: Physician Assistant

## 2019-02-14 ENCOUNTER — Ambulatory Visit (INDEPENDENT_AMBULATORY_CARE_PROVIDER_SITE_OTHER): Payer: Medicare Other | Admitting: Physician Assistant

## 2019-02-14 ENCOUNTER — Other Ambulatory Visit: Payer: Self-pay

## 2019-02-14 VITALS — BP 147/78 | HR 83 | Temp 97.1°F | Wt 333.0 lb

## 2019-02-14 DIAGNOSIS — G6289 Other specified polyneuropathies: Secondary | ICD-10-CM | POA: Diagnosis not present

## 2019-02-14 DIAGNOSIS — M545 Low back pain, unspecified: Secondary | ICD-10-CM

## 2019-02-14 DIAGNOSIS — K5904 Chronic idiopathic constipation: Secondary | ICD-10-CM | POA: Diagnosis not present

## 2019-02-14 DIAGNOSIS — E119 Type 2 diabetes mellitus without complications: Secondary | ICD-10-CM | POA: Diagnosis not present

## 2019-02-14 LAB — POCT GLYCOSYLATED HEMOGLOBIN (HGB A1C)
Est. average glucose Bld gHb Est-mCnc: 169
Hemoglobin A1C: 7.5 % — AB (ref 4.0–5.6)

## 2019-02-14 MED ORDER — GABAPENTIN 300 MG PO CAPS: 300.0000 mg | ORAL_CAPSULE | Freq: Every day | ORAL | 1 refills | Status: DC

## 2019-02-14 MED ORDER — METFORMIN HCL ER 500 MG PO TB24: 500.0000 mg | ORAL_TABLET | Freq: Two times a day (BID) | ORAL | 0 refills | Status: DC

## 2019-02-14 NOTE — Patient Instructions (Addendum)
Start with 300 mg gabapentin at night. If you want to take more, three days later, start taking gabapentin 300 mg in the morning and night.     American Diabetes Association - GOOGLE THIS

## 2019-02-16 ENCOUNTER — Encounter: Payer: Self-pay | Admitting: Physician Assistant

## 2019-02-16 ENCOUNTER — Encounter: Payer: Self-pay | Admitting: Oncology

## 2019-02-16 ENCOUNTER — Encounter (INDEPENDENT_AMBULATORY_CARE_PROVIDER_SITE_OTHER): Payer: Self-pay

## 2019-02-19 ENCOUNTER — Ambulatory Visit
Admission: RE | Admit: 2019-02-19 | Discharge: 2019-02-19 | Disposition: A | Discharge status: Home / Self Care | Payer: Medicare Other | Source: Ambulatory Visit | Attending: Oncology | Admitting: Oncology

## 2019-02-19 ENCOUNTER — Other Ambulatory Visit: Payer: Self-pay

## 2019-02-19 DIAGNOSIS — I2699 Other pulmonary embolism without acute cor pulmonale: Secondary | ICD-10-CM | POA: Diagnosis not present

## 2019-02-19 DIAGNOSIS — R600 Localized edema: Secondary | ICD-10-CM | POA: Diagnosis not present

## 2019-02-20 ENCOUNTER — Inpatient Hospital Stay: Payer: Medicare Other | Attending: Oncology

## 2019-02-20 ENCOUNTER — Other Ambulatory Visit: Payer: Self-pay

## 2019-02-20 DIAGNOSIS — Z86718 Personal history of other venous thrombosis and embolism: Secondary | ICD-10-CM | POA: Insufficient documentation

## 2019-02-20 DIAGNOSIS — Z86711 Personal history of pulmonary embolism: Secondary | ICD-10-CM | POA: Diagnosis not present

## 2019-02-20 DIAGNOSIS — Z7901 Long term (current) use of anticoagulants: Secondary | ICD-10-CM | POA: Diagnosis not present

## 2019-02-20 DIAGNOSIS — I2699 Other pulmonary embolism without acute cor pulmonale: Secondary | ICD-10-CM

## 2019-02-20 LAB — CBC WITH DIFFERENTIAL/PLATELET
Abs Immature Granulocytes: 0.05 10*3/uL (ref 0.00–0.07)
Basophils Absolute: 0 10*3/uL (ref 0.0–0.1)
Basophils Relative: 0 %
Eosinophils Absolute: 0.3 10*3/uL (ref 0.0–0.5)
Eosinophils Relative: 3 %
HCT: 44.1 % (ref 39.0–52.0)
Hemoglobin: 14 g/dL (ref 13.0–17.0)
Immature Granulocytes: 1 %
Lymphocytes Relative: 24 %
Lymphs Abs: 1.9 10*3/uL (ref 0.7–4.0)
MCH: 30.6 pg (ref 26.0–34.0)
MCHC: 31.7 g/dL (ref 30.0–36.0)
MCV: 96.3 fL (ref 80.0–100.0)
Monocytes Absolute: 0.7 10*3/uL (ref 0.1–1.0)
Monocytes Relative: 8 %
Neutro Abs: 5.3 10*3/uL (ref 1.7–7.7)
Neutrophils Relative %: 64 %
Platelets: 219 10*3/uL (ref 150–400)
RBC: 4.58 MIL/uL (ref 4.22–5.81)
RDW: 13.2 % (ref 11.5–15.5)
WBC: 8.2 10*3/uL (ref 4.0–10.5)
nRBC: 0 % (ref 0.0–0.2)

## 2019-02-20 LAB — COMPREHENSIVE METABOLIC PANEL
ALT: 27 U/L (ref 0–44)
AST: 20 U/L (ref 15–41)
Albumin: 3.9 g/dL (ref 3.5–5.0)
Alkaline Phosphatase: 84 U/L (ref 38–126)
Anion gap: 8 (ref 5–15)
BUN: 24 mg/dL — ABNORMAL HIGH (ref 8–23)
CO2: 27 mmol/L (ref 22–32)
Calcium: 9.3 mg/dL (ref 8.9–10.3)
Chloride: 103 mmol/L (ref 98–111)
Creatinine, Ser: 0.93 mg/dL (ref 0.61–1.24)
GFR calc Af Amer: >60 mL/min (ref >60)
GFR calc non Af Amer: >60 mL/min (ref >60)
Glucose, Bld: 194 mg/dL — ABNORMAL HIGH (ref 70–99)
Potassium: 3.9 mmol/L (ref 3.5–5.1)
Sodium: 138 mmol/L (ref 135–145)
Total Bilirubin: 0.7 mg/dL (ref 0.3–1.2)
Total Protein: 7.4 g/dL (ref 6.5–8.1)

## 2019-02-21 ENCOUNTER — Inpatient Hospital Stay (HOSPITAL_BASED_OUTPATIENT_CLINIC_OR_DEPARTMENT_OTHER): Payer: Medicare Other | Admitting: Oncology

## 2019-02-21 ENCOUNTER — Encounter: Payer: Self-pay | Admitting: Oncology

## 2019-02-21 ENCOUNTER — Inpatient Hospital Stay: Payer: Medicare Other

## 2019-02-21 ENCOUNTER — Other Ambulatory Visit: Payer: Self-pay

## 2019-02-21 DIAGNOSIS — Z86718 Personal history of other venous thrombosis and embolism: Secondary | ICD-10-CM | POA: Diagnosis not present

## 2019-02-21 DIAGNOSIS — Z86711 Personal history of pulmonary embolism: Secondary | ICD-10-CM

## 2019-02-21 DIAGNOSIS — Z7901 Long term (current) use of anticoagulants: Secondary | ICD-10-CM | POA: Diagnosis not present

## 2019-02-21 MED ORDER — APIXABAN 2.5 MG PO TABS: 2.5000 mg | ORAL_TABLET | Freq: Two times a day (BID) | ORAL | 1 refills | Status: DC

## 2019-02-21 NOTE — Progress Notes (Signed)
HEMATOLOGY-ONCOLOGY TeleHEALTH VISIT PROGRESS NOTE  I connected with Ronald Malone on 02/21/19 at 10:00 AM EST by video enabled telemedicine visit and verified that I am speaking with the correct person using two identifiers. I discussed the limitations, risks, security and privacy concerns of performing an evaluation and management service by telemedicine and the availability of in-person appointments. I also discussed with the patient that there may be a patient responsible charge related to this service. The patient expressed understanding and agreed to proceed.   Other persons participating in the visit and their role in the encounter:  None  Patient's location: Home  Provider's location: office Chief Complaint: History of DVT/PE, Chronic anticoagulation.   INTERVAL HISTORY Ronald Malone is a 63 y.o. male who has above history reviewed by me today presents for follow up visit for management of DVT/PE Problems and complaints are listed below:  Patient reports feeling well.  No new complaints.  Patient has been on Eliquis 5 mg twice daily. Denies any bleeding events.  Review of Systems  Constitutional: Positive for fatigue. Negative for appetite change, chills, fever and unexpected weight change.  HENT:   Negative for hearing loss and voice change.   Eyes: Negative for eye problems and icterus.  Respiratory: Negative for chest tightness, cough and shortness of breath.   Cardiovascular: Negative for chest pain and leg swelling.  Gastrointestinal: Negative for abdominal distention and abdominal pain.  Endocrine: Negative for hot flashes.  Genitourinary: Negative for difficulty urinating, dysuria and frequency.   Musculoskeletal: Negative for arthralgias.  Skin: Negative for itching and rash.       Chronic anterior lower extremity redness  Neurological: Negative for light-headedness and numbness.  Hematological: Negative for adenopathy. Does not bruise/bleed easily.   Psychiatric/Behavioral: Negative for confusion.    Past Medical History:  Diagnosis Date  . CHF (congestive heart failure) ([place])   . Clotting disorder ([place])   . MI (myocardial infarction) ([place])   . Pulmonary embolism [facility])    Past Surgical History:  Procedure Laterality Date  . CARDIAC CATHETERIZATION    . COLON SURGERY     1/3 colon removed as an infant  . COLONOSCOPY WITH PROPOFOL N/A 12/26/2018   Procedure: COLONOSCOPY WITH PROPOFOL;  Surgeon: Lucilla Lame, MD;  Location: [facility] ENDOSCOPY;  Service: Endoscopy;  Laterality: N/A;  . LOWER EXTREMITY ANGIOGRAPHY Right 10/23/2018   Procedure: LOWER EXTREMITY ANGIOGRAPHY;  Surgeon: Algernon Huxley, MD;  Location: [facility] CV LAB;  Service: Cardiovascular;  Laterality: Right;    Family History  Problem Relation Age of Onset  . Parkinson's disease Mother   . Dementia Father     Social History   Socioeconomic History  . Marital status: Married    Spouse name: [name]   . Number of children: 2  . Years of education: Not on file  . Highest education level: Not on file  Occupational History  . Occupation: RETIRED   Social Needs  . Financial resource strain: Not hard at all  . Food insecurity    Worry: Never true    Inability: Never true  . Transportation needs    Medical: No    Non-medical: No  Tobacco Use  . Smoking status: Current Every Day Smoker    Packs/day: 3.00    Years: 45.00    Pack years: 135.00    Types: Cigarettes    Last attempt to quit: 09/15/2018    Years since quitting: 0.4  . Smokeless tobacco: Never Used  Substance and  Sexual Activity  . Alcohol use: Yes  . Drug use: Yes    Types: Psilocybin  . Sexual activity: Not on file  Lifestyle  . Physical activity    Days per week: Not on file    Minutes per session: Not on file  . Stress: Only a little  Relationships  . Social connections    Talks on phone: More than three times a week    Gets together: Not on file    Attends religious service: Not on  file    Active member of club or organization: Not on file    Attends meetings of clubs or organizations: Not on file    Relationship status: Not on file  . Intimate partner violence    Fear of current or ex partner: Not on file    Emotionally abused: No    Physically abused: No    Forced sexual activity: No  Other Topics Concern  . Not on file  Social History Narrative  . Not on file    Current Outpatient Medications on File Prior to Visit  Medication Sig Dispense Refill  . atorvastatin (LIPITOR) 40 MG tablet Take 40 mg by mouth daily.    . blood glucose meter kit and supplies Dispense based on patient and insurance preference. Use once daily in the morning. (FOR ICD-10 E11.9). 1 each 0  . gabapentin (NEURONTIN) 300 MG capsule Take 1 capsule (300 mg total) by mouth at bedtime. 90 capsule 1  . metFORMIN (GLUCOPHAGE XR) 500 MG 24 hr tablet Take 1 tablet (500 mg total) by mouth 2 (two) times daily. 180 tablet 0  . nitroGLYCERIN (NITROSTAT) 0.4 MG SL tablet PLACE 1 TABLET (0.4 MG TOTAL) UNDER THE TONGUE EVERY 5 (FIVE) MINUTES AS NEEDED FOR CHEST PAIN. (Patient not taking: Reported on 12/26/2018) 300 tablet 1   No current facility-administered medications on file prior to visit.     No Known Allergies     Observations/Objective: Today's Vitals   02/21/19 0947  PainSc: 0-No pain   There is no height or weight on file to calculate BMI.  Physical Exam  Constitutional: He is oriented to person, place, and time. No distress.  Neurological: He is alert and oriented to person, place, and time.  Psychiatric: Mood normal.    CBC    Component Value Date/Time   WBC 8.2 02/20/2019 1136   RBC 4.58 02/20/2019 1136   HGB 14.0 02/20/2019 1136   HCT 44.1 02/20/2019 1136   PLT 219 02/20/2019 1136   MCV 96.3 02/20/2019 1136   MCH 30.6 02/20/2019 1136   MCHC 31.7 02/20/2019 1136   RDW 13.2 02/20/2019 1136   LYMPHSABS 1.9 02/20/2019 1136   MONOABS 0.7 02/20/2019 1136   EOSABS 0.3  02/20/2019 1136   BASOSABS 0.0 02/20/2019 1136    CMP     Component Value Date/Time   NA 138 02/20/2019 1136   NA 140 10/18/2018 1052   K 3.9 02/20/2019 1136   CL 103 02/20/2019 1136   CO2 27 02/20/2019 1136   GLUCOSE 194 (H) 02/20/2019 1136   BUN 24 (H) 02/20/2019 1136   BUN 24 10/18/2018 1052   CREATININE 0.93 02/20/2019 1136   CALCIUM 9.3 02/20/2019 1136   PROT 7.4 02/20/2019 1136   PROT 6.5 10/18/2018 1052   ALBUMIN 3.9 02/20/2019 1136   ALBUMIN 4.0 10/18/2018 1052   AST 20 02/20/2019 1136   ALT 27 02/20/2019 1136   ALKPHOS 84 02/20/2019 1136   BILITOT 0.7 02/20/2019  1136   BILITOT 0.5 10/18/2018 1052   GFRNONAA >60 02/20/2019 1136   GFRAA >60 02/20/2019 1136    RADIOGRAPHIC STUDIES: I have personally reviewed the radiological images as listed and agreed with the findings in the report. US Venous Img Lower Unilateral Right  Result Date: 02/19/2019 CLINICAL DATA:  Right lower extremity pain and edema. History of previous DVT and pulmonary embolism. Evaluate for acute or chronic DVT. EXAM: RIGHT LOWER EXTREMITY VENOUS DOPPLER ULTRASOUND TECHNIQUE: Gray-scale sonography with graded compression, as well as color Doppler and duplex ultrasound were performed to evaluate the lower extremity deep venous systems from the level of the common femoral vein and including the common femoral, femoral, profunda femoral, popliteal and calf veins including the posterior tibial, peroneal and gastrocnemius veins when visible. The superficial great saphenous vein was also interrogated. Spectral Doppler was utilized to evaluate flow at rest and with distal augmentation maneuvers in the common femoral, femoral and popliteal veins. COMPARISON:  Bilateral lower extremity venous Doppler ultrasound-09/01/2018 FINDINGS: Contralateral Common Femoral Vein: Respiratory phasicity is normal and symmetric with the symptomatic side. No evidence of thrombus. Normal compressibility. Common Femoral Vein: No evidence  of thrombus. Normal compressibility, respiratory phasicity and response to augmentation. Saphenofemoral Junction: No evidence of thrombus. Normal compressibility and flow on color Doppler imaging. Profunda Femoral Vein: No evidence of thrombus. Normal compressibility and flow on color Doppler imaging. Femoral Vein: No evidence of thrombus. Normal compressibility, respiratory phasicity and response to augmentation. Popliteal Vein: No evidence of acute or chronic thrombus. Normal compressibility, respiratory phasicity and response to augmentation. Calf Veins: No evidence of thrombus. Normal compressibility and flow on color Doppler imaging. Superficial Great Saphenous Vein: No evidence of thrombus. Normal compressibility. Venous Reflux:  None. Other Findings:  None. IMPRESSION: No evidence of acute or chronic DVT within right lower extremity with special attention paid to the right popliteal vein. Electronically Signed   By: Sandi Mariscal M.D.   On: 02/19/2019 13:44    Assessment and Plan: 1. History of pulmonary embolism   2. History of deep vein thrombosis (DVT) of lower extremity   3. Chronic anticoagulation     Ultrasound on right lower extremity was independently reviewed by me discussed with patient.  No evidence of acute or chronic DVT. Patient has finished about 6 months of anticoagulation with Eliquis 5 mg twice daily.  He tolerates well. I recommend patient to switch to Eliquis 2.5 mg twice daily as maintenance. Prescription was sent to pharmacy. Blood glucose level 180.  Discussed with patient.  Per patient he was recently started on Metformin for diabetes.  Diabetic dieting discussed with patient. Follow Up Instructions: 6 months   I discussed the assessment and treatment plan with the patient. The patient was provided an opportunity to ask questions and all were answered. The patient agreed with the plan and demonstrated an understanding of the instructions.  The patient was advised to  call back or seek an in-person evaluation if the symptoms worsen or if the condition fails to improve as anticipated.   I provided 15 minutes of face-to-face video visit time during this encounter, and > 50% was spent counseling as documented under my assessment & plan.  Earlie Server, MD 02/21/2019 8:59 PM

## 2019-02-21 NOTE — Progress Notes (Signed)
Patient contacted for telehealth visit. States that his feet are always numb.

## 2019-02-27 ENCOUNTER — Encounter (INDEPENDENT_AMBULATORY_CARE_PROVIDER_SITE_OTHER): Payer: Self-pay

## 2019-02-27 ENCOUNTER — Ambulatory Visit (INDEPENDENT_AMBULATORY_CARE_PROVIDER_SITE_OTHER): Payer: Medicare Other | Admitting: Vascular Surgery

## 2019-03-05 ENCOUNTER — Other Ambulatory Visit: Payer: Self-pay | Admitting: Physician Assistant

## 2019-03-05 DIAGNOSIS — I2510 Atherosclerotic heart disease of native coronary artery without angina pectoris: Secondary | ICD-10-CM | POA: Diagnosis not present

## 2019-03-05 DIAGNOSIS — I824Z9 Acute embolism and thrombosis of unspecified deep veins of unspecified distal lower extremity: Secondary | ICD-10-CM | POA: Diagnosis not present

## 2019-03-05 DIAGNOSIS — I208 Other forms of angina pectoris: Secondary | ICD-10-CM | POA: Diagnosis not present

## 2019-03-20 ENCOUNTER — Encounter: Payer: Self-pay | Admitting: Physician Assistant

## 2019-05-17 ENCOUNTER — Ambulatory Visit (INDEPENDENT_AMBULATORY_CARE_PROVIDER_SITE_OTHER): Payer: Medicare Other | Admitting: Physician Assistant

## 2019-05-17 ENCOUNTER — Other Ambulatory Visit: Payer: Self-pay

## 2019-05-17 ENCOUNTER — Encounter: Payer: Self-pay | Admitting: Physician Assistant

## 2019-05-17 VITALS — BP 140/80 | HR 81 | Temp 97.1°F | Wt 345.0 lb

## 2019-05-17 DIAGNOSIS — G6289 Other specified polyneuropathies: Secondary | ICD-10-CM | POA: Diagnosis not present

## 2019-05-17 DIAGNOSIS — I2699 Other pulmonary embolism without acute cor pulmonale: Secondary | ICD-10-CM

## 2019-05-17 DIAGNOSIS — E119 Type 2 diabetes mellitus without complications: Secondary | ICD-10-CM | POA: Diagnosis not present

## 2019-05-17 DIAGNOSIS — E1165 Type 2 diabetes mellitus with hyperglycemia: Secondary | ICD-10-CM | POA: Insufficient documentation

## 2019-05-17 DIAGNOSIS — J439 Emphysema, unspecified: Secondary | ICD-10-CM

## 2019-05-17 DIAGNOSIS — I878 Other specified disorders of veins: Secondary | ICD-10-CM

## 2019-05-17 LAB — POCT GLYCOSYLATED HEMOGLOBIN (HGB A1C)
Est. average glucose Bld gHb Est-mCnc: 171
Hemoglobin A1C: 7.6 % — AB (ref 4.0–5.6)

## 2019-05-17 MED ORDER — METFORMIN HCL ER 500 MG PO TB24: 1000.0000 mg | ORAL_TABLET | Freq: Two times a day (BID) | ORAL | 1 refills | Status: DC

## 2019-05-17 MED ORDER — GABAPENTIN 300 MG PO CAPS: 300.0000 mg | ORAL_CAPSULE | Freq: Every day | ORAL | 1 refills | Status: DC

## 2019-05-17 NOTE — Patient Instructions (Signed)
Diabetes Mellitus and Exercise Exercising regularly is important for your overall health, especially when you have diabetes (diabetes mellitus). Exercising is not only about losing weight. It has many other health benefits, such as increasing muscle strength and bone density and reducing body fat and stress. This leads to improved fitness, flexibility, and endurance, all of which result in better overall health. Exercise has additional benefits for people with diabetes, including:  Reducing appetite.  Helping to lower and control blood glucose.  Lowering blood pressure.  Helping to control amounts of fatty substances (lipids) in the blood, such as cholesterol and triglycerides.  Helping the body to respond better to insulin (improving insulin sensitivity).  Reducing how much insulin the body needs.  Decreasing the risk for heart disease by: ? Lowering cholesterol and triglyceride levels. ? Increasing the levels of good cholesterol. ? Lowering blood glucose levels. What is my activity plan? Your health care provider or certified diabetes educator can help you make a plan for the type and frequency of exercise (activity plan) that works for you. Make sure that you:  Do at least 150 minutes of moderate-intensity or vigorous-intensity exercise each week. This could be brisk walking, biking, or water aerobics. ? Do stretching and strength exercises, such as yoga or weightlifting, at least 2 times a week. ? Spread out your activity over at least 3 days of the week.  Get some form of physical activity every day. ? Do not go more than 2 days in a row without some kind of physical activity. ? Avoid being inactive for more than 30 minutes at a time. Take frequent breaks to walk or stretch.  Choose a type of exercise or activity that you enjoy, and set realistic goals.  Start slowly, and gradually increase the intensity of your exercise over time. What do I need to know about managing my  diabetes?   Check your blood glucose before and after exercising. ? If your blood glucose is 240 mg/dL (13.3 mmol/L) or higher before you exercise, check your urine for ketones. If you have ketones in your urine, do not exercise until your blood glucose returns to normal. ? If your blood glucose is 100 mg/dL (5.6 mmol/L) or lower, eat a snack containing 15-20 grams of carbohydrate. Check your blood glucose 15 minutes after the snack to make sure that your level is above 100 mg/dL (5.6 mmol/L) before you start your exercise.  Know the symptoms of low blood glucose (hypoglycemia) and how to treat it. Your risk for hypoglycemia increases during and after exercise. Common symptoms of hypoglycemia can include: ? Hunger. ? Anxiety. ? Sweating and feeling clammy. ? Confusion. ? Dizziness or feeling light-headed. ? Increased heart rate or palpitations. ? Blurry vision. ? Tingling or numbness around the mouth, lips, or tongue. ? Tremors or shakes. ? Irritability.  Keep a rapid-acting carbohydrate snack available before, during, and after exercise to help prevent or treat hypoglycemia.  Avoid injecting insulin into areas of the body that are going to be exercised. For example, avoid injecting insulin into: ? The arms, when playing tennis. ? The legs, when jogging.  Keep records of your exercise habits. Doing this can help you and your health care provider adjust your diabetes management plan as needed. Write down: ? Food that you eat before and after you exercise. ? Blood glucose levels before and after you exercise. ? The type and amount of exercise you have done. ? When your insulin is expected to peak, if you use   insulin. Avoid exercising at times when your insulin is peaking.  When you start a new exercise or activity, work with your health care provider to make sure the activity is safe for you, and to adjust your insulin, medicines, or food intake as needed.  Drink plenty of water while  you exercise to prevent dehydration or heat stroke. Drink enough fluid to keep your urine clear or pale yellow. Summary  Exercising regularly is important for your overall health, especially when you have diabetes (diabetes mellitus).  Exercising has many health benefits, such as increasing muscle strength and bone density and reducing body fat and stress.  Your health care provider or certified diabetes educator can help you make a plan for the type and frequency of exercise (activity plan) that works for you.  When you start a new exercise or activity, work with your health care provider to make sure the activity is safe for you, and to adjust your insulin, medicines, or food intake as needed. This information is not intended to replace advice given to you by your health care provider. Make sure you discuss any questions you have with your health care provider. Document Revised: 09/23/2016 Document Reviewed: 08/11/2015 Elsevier Patient Education  2020 [employer]  

## 2019-05-17 NOTE — Progress Notes (Signed)
Patient: Ronald Malone Male    DOB: 1955/05/13   64 y.o.   MRN: [id number] Visit Date: 05/17/2019  Today's Provider: [name], PA-C   Chief Complaint  Patient presents with  . Diabetes   Subjective:     HPI  Diabetes Mellitus Type II, Follow-up:   Lab Results  Component Value Date   HGBA1C 7.6 (A) 05/17/2019   HGBA1C 7.5 (A) 02/14/2019   HGBA1C 6.9 (H) 10/18/2018    Last seen for diabetes 3 months ago.  Management since then includes metformin 500 MG 24 hr tablet; Take 1 tablet (500 mg total) by mouth 2 (two) times daily.  Marland Kitchen He reports good compliance with treatment. He is having side effects.  Current symptoms include lower leg discoloration & swelling. and have been unchanged. Home blood sugar records: not currently checking at home per pt.  Episodes of hypoglycemia? no   Current insulin regiment: Is not on insulin Most Recent Eye Exam: 01/18/2019 Weight trend: increasing steadily Prior visit with dietician: No Current exercise: none Current diet habits: well balanced  Pertinent Labs:    Component Value Date/Time   CHOL 145 10/18/2018 1052   TRIG 280 (H) 10/18/2018 1052   HDL 43 10/18/2018 1052   LDLCALC 46 10/18/2018 1052   CREATININE 0.93 02/20/2019 1136    Wt Readings from Last 3 Encounters:  05/17/19 (!) 345 lb (156.5 kg)  02/14/19 (!) 333 lb (151 kg)  01/18/19 (!) 336 lb (152.4 kg)   Tobacco Abuse: Currently smoking 1/2 pack per day.   Typical Day of Eating: Breakfast - sausages ( 2 patties), 3-4 eggs, toast - english muffin butter, coffee ( cream) Snacks: Coffee (cream) Dinner: spaghetti, pork chops, steak   Height: 6'0   BP Readings from Last 3 Encounters:  05/17/19 140/80  02/14/19 (!) 147/78  01/18/19 128/74   He reports swelling and discoloration of his leg. He has been followed by vascular surgery for claudication. He had angiography of both lower extremities 10/2018 which did not require intervention. 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 He has worn compression stockings once but they were too tight.  ------------------------------------------------------------------------   No Known Allergies   Current Outpatient Medications:  .  ACCU-CHEK GUIDE test strip, USE ONCE DAILY IN MORNING (E11.9), Disp: 100 strip, Rfl: 0 .  apixaban (ELIQUIS) 2.5 MG TABS tablet, Take 1 tablet (2.5 mg total) by mouth 2 (two) times daily., Disp: 180 tablet, Rfl: 1 .  atorvastatin (LIPITOR) 40 MG tablet, Take 40 mg by mouth daily., Disp: , Rfl:  .  blood glucose meter kit and supplies, Dispense based on patient and insurance preference. Use once daily in the morning. (FOR ICD-10 E11.9)., Disp: 1 each, Rfl: 0 .  gabapentin (NEURONTIN) 300 MG capsule, Take 1 capsule (300 mg total) by mouth at bedtime., Disp: 90 capsule, Rfl: 1 .  metFORMIN (GLUCOPHAGE XR) 500 MG 24 hr tablet, Take 2 tablets (1,000 mg total) by mouth 2 (two) times daily., Disp: 360 tablet, Rfl: 1 .  nitroGLYCERIN (NITROSTAT) 0.4 MG SL tablet, PLACE 1 TABLET (0.4 MG TOTAL) UNDER THE TONGUE EVERY 5 (FIVE) MINUTES AS NEEDED FOR CHEST PAIN. (Patient not taking: Reported on 12/26/2018), Disp: 300 tablet, Rfl: 1  Review of Systems  Social History   Tobacco Use  . Smoking status: Current Every Day Smoker    Packs/day: 3.00    Years: 45.00    Pack years: 135.00    Types: Cigarettes  Last attempt to quit: 09/15/2018    Years since quitting: 0.6  . Smokeless tobacco: Never Used  Substance Use Topics  . Alcohol use: Yes      Objective:   BP 140/80 (BP Location: Left Arm, Patient Position: Sitting, Cuff Size: Large)   Pulse 81   Temp (!) 97.1 F (36.2 C) (Temporal)   Wt (!) 345 lb (156.5 kg)   SpO2 95%   BMI 45.52 kg/m  Vitals:   05/17/19 0818  BP: 140/80  Pulse: 81  Temp: (!) 97.1 F (36.2 C)  TempSrc: Temporal  SpO2: 95%  Weight: (!) 345 lb (156.5 kg)  Body mass index is 45.52 kg/m.   Physical Exam Constitutional:       Appearance: Normal appearance. He is obese.  Cardiovascular:     Rate and Rhythm: Normal rate and regular rhythm.     Heart sounds: Normal heart sounds.  Pulmonary:     Effort: Pulmonary effort is normal.     Breath sounds: Normal breath sounds.  Skin:    General: Skin is warm and dry.     Comments: Hyperpigmentation of the anterior bilateral lower legs, left > right.   Neurological:     Mental Status: He is alert and oriented to person, place, and time. Mental status is at baseline.  Psychiatric:        Mood and Affect: Mood normal.        Behavior: Behavior normal.      Results for orders placed or performed in visit on 05/17/19  POCT glycosylated hemoglobin (Hb A1C)  Result Value Ref Range   Hemoglobin A1C 7.6 (A) 4.0 - 5.6 %   HbA1c POC (<> result, manual entry)     HbA1c, POC (prediabetic range)     HbA1c, POC (controlled diabetic range)     Est. average glucose Bld gHb Est-mCnc 171        Assessment & Plan    1. Diabetes mellitus without complication ([place])  We will increase metformin as below to better control sugar. We discussed reducing high glycemic foods including sweetened drinks, processed foods, starches and starchy vegetables including potatoes and corn. He has met with our health education team. He declines nutritionist meeting.   - POCT glycosylated hemoglobin (Hb A1C) - metFORMIN (GLUCOPHAGE XR) 500 MG 24 hr tablet; Take 2 tablets (1,000 mg total) by mouth 2 (two) times daily.  Dispense: 360 tablet; Refill: 1  2. Other polyneuropathy  - gabapentin (NEURONTIN) 300 MG capsule; Take 1 capsule (300 mg total) by mouth at bedtime.  Dispense: 90 capsule; Refill: 1  3. Type 2 diabetes mellitus with hyperglycemia, without long-term current use of insulin ([place])   4. Morbid obesity (HCC)  Gained 12 lbs since last visit.  5. Other acute pulmonary embolism without acute cor pulmonale (HCC)   6. Pulmonary emphysema, unspecified emphysema type ([place])  Continues  to smoke. Not interested in cessation aids. Smoking 1/2 pack daily.   The entirety of the information documented in the History of Present Illness, Review of Systems and Physical Exam were personally obtained by me. Portions of this information were initially documented by [facility] and reviewed by me for thoroughness and accuracy.   7. Venous stasis  Skin changes on lower extremities likely associated with venous stasis. Advised about compression stockings and need to follow up with vascular surgery who would be able to better assist in customized compression stockings and/or interventions.   F/u 3 months   The entirety  of the information documented in the History of Present Illness, Review of Systems and Physical Exam were personally obtained by me. Portions of this information were initially documented by [facility] and reviewed by me for thoroughness and accuracy.      [name], PA-C  [facility]

## 2019-06-17 ENCOUNTER — Encounter: Payer: Self-pay | Admitting: Physician Assistant

## 2019-06-17 ENCOUNTER — Encounter (INDEPENDENT_AMBULATORY_CARE_PROVIDER_SITE_OTHER): Payer: Self-pay

## 2019-06-18 ENCOUNTER — Other Ambulatory Visit (INDEPENDENT_AMBULATORY_CARE_PROVIDER_SITE_OTHER): Payer: Self-pay | Admitting: Nurse Practitioner

## 2019-06-18 ENCOUNTER — Ambulatory Visit (INDEPENDENT_AMBULATORY_CARE_PROVIDER_SITE_OTHER): Payer: Medicare Other | Admitting: Physician Assistant

## 2019-06-18 ENCOUNTER — Encounter: Payer: Self-pay | Admitting: Physician Assistant

## 2019-06-18 ENCOUNTER — Other Ambulatory Visit: Payer: Self-pay

## 2019-06-18 VITALS — Temp 97.3°F

## 2019-06-18 DIAGNOSIS — L819 Disorder of pigmentation, unspecified: Secondary | ICD-10-CM

## 2019-06-18 DIAGNOSIS — J441 Chronic obstructive pulmonary disease with (acute) exacerbation: Secondary | ICD-10-CM

## 2019-06-18 DIAGNOSIS — L989 Disorder of the skin and subcutaneous tissue, unspecified: Secondary | ICD-10-CM

## 2019-06-18 MED ORDER — PREDNISONE 20 MG PO TABS: 40.0000 mg | ORAL_TABLET | Freq: Every day | ORAL | 0 refills | Status: AC

## 2019-06-18 MED ORDER — DOXYCYCLINE HYCLATE 100 MG PO TABS: 100.0000 mg | ORAL_TABLET | Freq: Two times a day (BID) | ORAL | 0 refills | Status: AC

## 2019-06-18 NOTE — Telephone Encounter (Signed)
Patient has appointment today,06/18/2019 @ 4:20 PM.

## 2019-06-18 NOTE — Telephone Encounter (Signed)
Abi and bil venous reflux.  Work with cody if needed but lets see if we can get him in soon.

## 2019-06-18 NOTE — Progress Notes (Signed)
Patient: Ronald Malone Male    DOB: January 07, 1956   64 y.o.   MRN: [id number] Visit Date: 06/18/2019  Today's Provider: [name], PA-C   Chief Complaint  Patient presents with  . Cough   Subjective:    Virtual Visit via Telephone Note  I connected with Ronald Malone on 06/18/19 at  4:20 PM EDT by telephone and verified that I am speaking with the correct person using two identifiers.  Location: Patient: Home Provider: Office    I discussed the limitations, risks, security and privacy concerns of performing an evaluation and management service by telephone and the availability of in person appointments. I also discussed with the patient that there may be a patient responsible charge related to this service. The patient expressed understanding and agreed to proceed.   Cough This is a new problem. The current episode started in the past 7 days. The problem has been unchanged. The problem occurs every few minutes. The cough is productive of sputum. Associated symptoms include rhinorrhea. Pertinent negatives include no fever or shortness of breath. Nothing aggravates the symptoms. He has tried rest and OTC cough suppressant for the symptoms.   Reports productive cough x 4 days with runny nose. He denies wheezing and SOB above baseline. He did get vaccinated for COVID, final dose was two months ago. Continues to smoke. No sick contacts. No passing out, lips not turning blue. Patient has 30+ pack year smoking history and continues to smoke.   No Known Allergies   Current Outpatient Medications:  .  apixaban (ELIQUIS) 2.5 MG TABS tablet, Take 1 tablet (2.5 mg total) by mouth 2 (two) times daily., Disp: 180 tablet, Rfl: 1 .  atorvastatin (LIPITOR) 40 MG tablet, Take 40 mg by mouth daily., Disp: , Rfl:  .  gabapentin (NEURONTIN) 300 MG capsule, Take 1 capsule (300 mg total) by mouth at bedtime., Disp: 90 capsule, Rfl: 1 .  metFORMIN (GLUCOPHAGE XR) 500 MG 24 hr tablet, Take  2 tablets (1,000 mg total) by mouth 2 (two) times daily., Disp: 360 tablet, Rfl: 1 .  ACCU-CHEK GUIDE test strip, USE ONCE DAILY IN MORNING (E11.9), Disp: 100 strip, Rfl: 0 .  blood glucose meter kit and supplies, Dispense based on patient and insurance preference. Use once daily in the morning. (FOR ICD-10 E11.9)., Disp: 1 each, Rfl: 0 .  nitroGLYCERIN (NITROSTAT) 0.4 MG SL tablet, PLACE 1 TABLET (0.4 MG TOTAL) UNDER THE TONGUE EVERY 5 (FIVE) MINUTES AS NEEDED FOR CHEST PAIN. (Patient not taking: Reported on 12/26/2018), Disp: 300 tablet, Rfl: 1  Review of Systems  Constitutional: Negative for fatigue and fever.  HENT: Positive for rhinorrhea. Negative for sinus pressure and sinus pain.   Eyes: Negative.   Respiratory: Positive for cough. Negative for shortness of breath.   Cardiovascular: Negative.   Gastrointestinal: Negative.   Endocrine: Negative.   Genitourinary: Negative.   Musculoskeletal: Negative.   Skin: Negative.   Neurological: Negative.   Hematological: Negative.   Psychiatric/Behavioral: Negative.     Social History   Tobacco Use  . Smoking status: Current Every Day Smoker    Packs/day: 3.00    Years: 45.00    Pack years: 135.00    Types: Cigarettes    Last attempt to quit: 09/15/2018    Years since quitting: 0.7  . Smokeless tobacco: Never Used  Substance Use Topics  . Alcohol use: Yes      Objective:   Temp (!) 97.3  F (36.3 C) (Oral)  Vitals:   06/18/19 0853  Temp: (!) 97.3 F (36.3 C)  TempSrc: Oral  There is no height or weight on file to calculate BMI.   Physical Exam   No results found for any visits on 06/18/19.     Assessment & Plan    1. COPD exacerbation (HCC)  Treat for pneumonia and likely COPD exacerbation as below. CXR ordered and advised patient how to get this at hospital. Advised to get COVID tested. Will likely follow up in one week, pending results.   - doxycycline (VIBRA-TABS) 100 MG tablet; Take 1 tablet (100 mg total) by  mouth 2 (two) times daily for 7 days.  Dispense: 14 tablet; Refill: 0 - predniSONE (DELTASONE) 20 MG tablet; Take 2 tablets (40 mg total) by mouth daily with breakfast for 5 days.  Dispense: 10 tablet; Refill: 0 - DG Chest 2 View; Future  I discussed the assessment and treatment plan with the patient. The patient was provided an opportunity to ask questions and all were answered. The patient agreed with the plan and demonstrated an understanding of the instructions.   The patient was advised to call back or seek an in-person evaluation if the symptoms worsen or if the condition fails to improve as anticipated.  I provided 25 minutes of non-face-to-face time during this encounter.  The entirety of the information documented in the History of Present Illness, Review of Systems and Physical Exam were personally obtained by me. Portions of this information were initially documented by [facility] and reviewed by me for thoroughness and accuracy.       [name], PA-C  [facility]

## 2019-06-19 ENCOUNTER — Telehealth (INDEPENDENT_AMBULATORY_CARE_PROVIDER_SITE_OTHER): Payer: Self-pay

## 2019-06-19 ENCOUNTER — Encounter (INDEPENDENT_AMBULATORY_CARE_PROVIDER_SITE_OTHER): Payer: Self-pay

## 2019-06-19 NOTE — Telephone Encounter (Signed)
A detail message was left on the patient voicemail informing due to symptoms his appointment would need to be reschedule once his symptoms has improved

## 2019-06-19 NOTE — Telephone Encounter (Signed)
Please contact the patient and let him know that due to his symptoms he will need to reschedule his ultrasounds until his symptoms improve

## 2019-06-20 ENCOUNTER — Encounter (INDEPENDENT_AMBULATORY_CARE_PROVIDER_SITE_OTHER): Payer: Medicare Other

## 2019-06-20 ENCOUNTER — Ambulatory Visit (INDEPENDENT_AMBULATORY_CARE_PROVIDER_SITE_OTHER): Payer: Medicare Other | Admitting: Nurse Practitioner

## 2019-07-17 DIAGNOSIS — H40003 Preglaucoma, unspecified, bilateral: Secondary | ICD-10-CM | POA: Diagnosis not present

## 2019-08-17 ENCOUNTER — Ambulatory Visit (INDEPENDENT_AMBULATORY_CARE_PROVIDER_SITE_OTHER): Payer: Medicare Other | Admitting: Physician Assistant

## 2019-08-17 ENCOUNTER — Encounter: Payer: Self-pay | Admitting: Physician Assistant

## 2019-08-17 ENCOUNTER — Other Ambulatory Visit: Payer: Self-pay

## 2019-08-17 VITALS — BP 138/82 | HR 83 | Temp 96.9°F | Wt 330.5 lb

## 2019-08-17 DIAGNOSIS — M5416 Radiculopathy, lumbar region: Secondary | ICD-10-CM

## 2019-08-17 DIAGNOSIS — E1165 Type 2 diabetes mellitus with hyperglycemia: Secondary | ICD-10-CM | POA: Diagnosis not present

## 2019-08-17 DIAGNOSIS — I252 Old myocardial infarction: Secondary | ICD-10-CM

## 2019-08-17 DIAGNOSIS — E119 Type 2 diabetes mellitus without complications: Secondary | ICD-10-CM

## 2019-08-17 DIAGNOSIS — J439 Emphysema, unspecified: Secondary | ICD-10-CM | POA: Diagnosis not present

## 2019-08-17 LAB — POCT GLYCOSYLATED HEMOGLOBIN (HGB A1C)
Est. average glucose Bld gHb Est-mCnc: 146
Hemoglobin A1C: 6.7 % — AB (ref 4.0–5.6)

## 2019-08-17 NOTE — Progress Notes (Signed)
Established patient visit   Patient: Ronald Malone   DOB: [birth year]   64 y.o. Male  MRN: [id number] Visit Date: 08/17/2019  Today's healthcare provider: [name], PA-C   Chief Complaint  Patient presents with  . Diabetes  I,Porsha C McClurkin,acting as a scribe for [employer], PA-C.,have documented all relevant documentation on the behalf of [name], PA-C,as directed by  [name], PA-C while in the presence of [name], PA-C.  Subjective    HPI Diabetes Mellitus Type II, Follow-up  Lab Results  Component Value Date   HGBA1C 6.7 (A) 08/17/2019   HGBA1C 7.6 (A) 05/17/2019   HGBA1C 7.5 (A) 02/14/2019   Wt Readings from Last 3 Encounters:  08/17/19 (!) 330 lb 8 oz (149.9 kg)  05/17/19 (!) 345 lb (156.5 kg)  02/14/19 (!) 333 lb (151 kg)   Last seen for diabetes 3 months ago.  Management since then includes increased Metformin to 2 tablets (1,000 mg total) by mouth twice daily. He reports good compliance with treatment. He is not having side effects.  Symptoms: Yes fatigue No foot ulcerations  No appetite changes No nausea  No paresthesia of the feet  No polydipsia  No polyuria No visual disturbances   No vomiting     Home blood sugar records: trend: stable  Episodes of hypoglycemia? No    Current insulin regiment: none Most Recent Eye Exam:  Current exercise: none Current diet habits: well balanced  Pertinent Labs: Lab Results  Component Value Date   CHOL 145 10/18/2018   HDL 43 10/18/2018   LDLCALC 46 10/18/2018   LDLDIRECT 71 10/18/2018   TRIG 280 (H) 10/18/2018   CHOLHDL 3.4 10/18/2018   Lab Results  Component Value Date   NA 138 02/20/2019   K 3.9 02/20/2019   CREATININE 0.93 02/20/2019   GFRNONAA >60 02/20/2019   GFRAA >60 02/20/2019   GLUCOSE 194 (H) 02/20/2019     Lipid/Cholesterol, Follow-up  Last lipid panel Other pertinent labs  Lab Results  Component Value Date   CHOL 145 10/18/2018   HDL  43 10/18/2018   LDLCALC 46 10/18/2018   LDLDIRECT 71 10/18/2018   TRIG 280 (H) 10/18/2018   CHOLHDL 3.4 10/18/2018   Lab Results  Component Value Date   ALT 27 02/20/2019   AST 20 02/20/2019   PLT 219 02/20/2019     He was last seen for this 3 months ago.  Management since that visit includes Lipitor 40 mg. Patient has a history of Type II DM, MI, pulmonary embolism. He reports a chiropractor told him lipitor can cause numbness in his feet so he brought it up with his cardiologist who told him to hold on the medication for 6 months. He reports numbness has improved. He was also started on gabapentin 6 months ago for lumbar radiculopathy.   He reports poor compliance with treatment. He is having side effects. Reports numbness and tingling went away when he stopped lipitor.   Symptoms: No chest pain No chest pressure/discomfort  No dyspnea No lower extremity edema  Yes numbness or tingling of extremity No orthopnea  No palpitations No paroxysmal nocturnal dyspnea  No speech difficulty No syncope   Current diet: in general, an "unhealthy" diet Current exercise: none  The ASCVD Risk score ([place]., et al., 2013) failed to calculate for the following reasons:   The patient has a prior MI or stroke diagnosis  ---------------------------------------------------------------------------------------------------  ---------------------------------------------------------------------------------------------------  Patient states that he stopped taking Lipitor 40 MG due to numbness. Patient was advised he should be on a cholesterol medication due to having a heart attack and pulmonary embolism.  Patient states he is smoking 2 packs of cigarettes a day.     Medications: Outpatient Medications Prior to Visit  Medication Sig  . ACCU-CHEK GUIDE test strip USE ONCE DAILY IN MORNING (E11.9)  . apixaban (ELIQUIS) 2.5 MG TABS tablet Take 1 tablet (2.5 mg total) by mouth 2 (two) times daily.    . blood glucose meter kit and supplies Dispense based on patient and insurance preference. Use once daily in the morning. (FOR ICD-10 E11.9).  Marland Kitchen atorvastatin (LIPITOR) 40 MG tablet Take 40 mg by mouth daily.  Marland Kitchen gabapentin (NEURONTIN) 300 MG capsule Take 1 capsule (300 mg total) by mouth at bedtime.  . metFORMIN (GLUCOPHAGE XR) 500 MG 24 hr tablet Take 2 tablets (1,000 mg total) by mouth 2 (two) times daily.  . nitroGLYCERIN (NITROSTAT) 0.4 MG SL tablet PLACE 1 TABLET (0.4 MG TOTAL) UNDER THE TONGUE EVERY 5 (FIVE) MINUTES AS NEEDED FOR CHEST PAIN. (Patient not taking: Reported on 12/26/2018)   No facility-administered medications prior to visit.    Review of Systems  Constitutional: Negative.   Respiratory: Negative.   Cardiovascular: Negative.   Neurological: Negative.   Hematological: Negative.       Objective    BP 138/82 (BP Location: Left Arm, Patient Position: Sitting, Cuff Size: Large)   Pulse 83   Temp (!) 96.9 F (36.1 C) (Temporal)   Wt (!) 330 lb 8 oz (149.9 kg)   SpO2 95%   BMI 43.60 kg/m    Physical Exam Constitutional:      Appearance: Normal appearance. He is obese.  Cardiovascular:     Rate and Rhythm: Normal rate.     Pulses: Normal pulses.     Heart sounds: Normal heart sounds.  Pulmonary:     Effort: Pulmonary effort is normal.     Breath sounds: Normal breath sounds.  Skin:    General: Skin is warm and dry.  Neurological:     General: No focal deficit present.     Mental Status: He is alert and oriented to person, place, and time.  Psychiatric:        Mood and Affect: Mood normal.        Behavior: Behavior normal.       Results for orders placed or performed in visit on 08/17/19  POCT glycosylated hemoglobin (Hb A1C)  Result Value Ref Range   Hemoglobin A1C 6.7 (A) 4.0 - 5.6 %   HbA1c POC (<> result, manual entry)     HbA1c, POC (prediabetic range)     HbA1c, POC (controlled diabetic range)     Est. average glucose Bld gHb Est-mCnc 146      Assessment & Plan    1. Type 2 diabetes mellitus with hyperglycemia, without long-term current use of insulin (HCC)  Well controlled with last A1c 6.7% Continue current medications UTD on vaccines, eye exam, foot exam On ACEi On Statin Discussed diet and exercise F/u in 6 months   2. Diabetes mellitus without complication ([place])  - POCT glycosylated hemoglobin (Hb A1C)  3. History of MI (myocardial infarction)  Needs to be on statin for this and DM. Counseled that if he did not tolerate lipitor he may tolerate statin in some form and he should discuss it with Dr. Clayborn Bigness.    4. Pulmonary emphysema, unspecified  emphysema type ([place])  Continues to smoke.   5. Lumbar radiculopathy  May trial off gabapentin to see if numbness/pain returns.    Return in about 6 months (around 02/16/2020) for for CPE and DM follow-up.      ITrinna Post, PA-C, have reviewed all documentation for this visit. The documentation on 08/17/19 for the exam, diagnosis, procedures, and orders are all accurate and complete.    Paulene Floor  [facility] [phone] (phone) [phone] (fax)  [place]

## 2019-08-21 ENCOUNTER — Inpatient Hospital Stay (HOSPITAL_BASED_OUTPATIENT_CLINIC_OR_DEPARTMENT_OTHER): Payer: Medicare Other | Admitting: Oncology

## 2019-08-21 ENCOUNTER — Other Ambulatory Visit: Payer: Self-pay

## 2019-08-21 ENCOUNTER — Encounter: Payer: Self-pay | Admitting: Oncology

## 2019-08-21 ENCOUNTER — Inpatient Hospital Stay: Payer: Medicare Other | Attending: Oncology

## 2019-08-21 VITALS — BP 122/78 | HR 90 | Temp 97.3°F | Resp 18 | Wt 331.6 lb

## 2019-08-21 DIAGNOSIS — I509 Heart failure, unspecified: Secondary | ICD-10-CM | POA: Diagnosis not present

## 2019-08-21 DIAGNOSIS — I252 Old myocardial infarction: Secondary | ICD-10-CM | POA: Diagnosis not present

## 2019-08-21 DIAGNOSIS — F17210 Nicotine dependence, cigarettes, uncomplicated: Secondary | ICD-10-CM | POA: Diagnosis not present

## 2019-08-21 DIAGNOSIS — Z86711 Personal history of pulmonary embolism: Secondary | ICD-10-CM

## 2019-08-21 DIAGNOSIS — Z720 Tobacco use: Secondary | ICD-10-CM

## 2019-08-21 DIAGNOSIS — Z7901 Long term (current) use of anticoagulants: Secondary | ICD-10-CM | POA: Insufficient documentation

## 2019-08-21 DIAGNOSIS — I878 Other specified disorders of veins: Secondary | ICD-10-CM | POA: Diagnosis not present

## 2019-08-21 DIAGNOSIS — M25569 Pain in unspecified knee: Secondary | ICD-10-CM | POA: Diagnosis not present

## 2019-08-21 DIAGNOSIS — Z86718 Personal history of other venous thrombosis and embolism: Secondary | ICD-10-CM | POA: Insufficient documentation

## 2019-08-21 DIAGNOSIS — Z7984 Long term (current) use of oral hypoglycemic drugs: Secondary | ICD-10-CM | POA: Insufficient documentation

## 2019-08-21 DIAGNOSIS — E1151 Type 2 diabetes mellitus with diabetic peripheral angiopathy without gangrene: Secondary | ICD-10-CM | POA: Insufficient documentation

## 2019-08-21 DIAGNOSIS — K760 Fatty (change of) liver, not elsewhere classified: Secondary | ICD-10-CM | POA: Diagnosis not present

## 2019-08-21 DIAGNOSIS — D689 Coagulation defect, unspecified: Secondary | ICD-10-CM | POA: Diagnosis not present

## 2019-08-21 DIAGNOSIS — E1140 Type 2 diabetes mellitus with diabetic neuropathy, unspecified: Secondary | ICD-10-CM | POA: Diagnosis not present

## 2019-08-21 DIAGNOSIS — I2510 Atherosclerotic heart disease of native coronary artery without angina pectoris: Secondary | ICD-10-CM | POA: Diagnosis not present

## 2019-08-21 DIAGNOSIS — J439 Emphysema, unspecified: Secondary | ICD-10-CM | POA: Diagnosis not present

## 2019-08-21 LAB — COMPREHENSIVE METABOLIC PANEL
ALT: 39 U/L (ref 0–44)
AST: 26 U/L (ref 15–41)
Albumin: 3.9 g/dL (ref 3.5–5.0)
Alkaline Phosphatase: 66 U/L (ref 38–126)
Anion gap: 10 (ref 5–15)
BUN: 18 mg/dL (ref 8–23)
CO2: 26 mmol/L (ref 22–32)
Calcium: 8.8 mg/dL — ABNORMAL LOW (ref 8.9–10.3)
Chloride: 103 mmol/L (ref 98–111)
Creatinine, Ser: 1.04 mg/dL (ref 0.61–1.24)
GFR calc Af Amer: >60 mL/min (ref >60)
GFR calc non Af Amer: >60 mL/min (ref >60)
Glucose, Bld: 166 mg/dL — ABNORMAL HIGH (ref 70–99)
Potassium: 4.1 mmol/L (ref 3.5–5.1)
Sodium: 139 mmol/L (ref 135–145)
Total Bilirubin: 0.7 mg/dL (ref 0.3–1.2)
Total Protein: 7.6 g/dL (ref 6.5–8.1)

## 2019-08-21 LAB — CBC WITH DIFFERENTIAL/PLATELET
Abs Immature Granulocytes: 0.03 10*3/uL (ref 0.00–0.07)
Basophils Absolute: 0 10*3/uL (ref 0.0–0.1)
Basophils Relative: 1 %
Eosinophils Absolute: 0.2 10*3/uL (ref 0.0–0.5)
Eosinophils Relative: 3 %
HCT: 43 % (ref 39.0–52.0)
Hemoglobin: 14.4 g/dL (ref 13.0–17.0)
Immature Granulocytes: 0 %
Lymphocytes Relative: 28 %
Lymphs Abs: 2.1 10*3/uL (ref 0.7–4.0)
MCH: 31 pg (ref 26.0–34.0)
MCHC: 33.5 g/dL (ref 30.0–36.0)
MCV: 92.5 fL (ref 80.0–100.0)
Monocytes Absolute: 0.6 10*3/uL (ref 0.1–1.0)
Monocytes Relative: 8 %
Neutro Abs: 4.6 10*3/uL (ref 1.7–7.7)
Neutrophils Relative %: 60 %
Platelets: 270 10*3/uL (ref 150–400)
RBC: 4.65 MIL/uL (ref 4.22–5.81)
RDW: 13.1 % (ref 11.5–15.5)
WBC: 7.7 10*3/uL (ref 4.0–10.5)
nRBC: 0 % (ref 0.0–0.2)

## 2019-08-21 MED ORDER — APIXABAN 2.5 MG PO TABS: 2.5000 mg | ORAL_TABLET | Freq: Two times a day (BID) | ORAL | 1 refills | Status: DC

## 2019-08-21 NOTE — Progress Notes (Signed)
Patient reports an increase in SOBr and pain in bilateral hip/leg.

## 2019-08-21 NOTE — Progress Notes (Signed)
Hematology/Oncology follow up note [name] Telephone:([phone]) [phone] Fax:([phone]) [phone]   Patient Care Team: Paulene Floor as PCP - General (Physician Assistant) Cathi Roan, [facility] (Pharmacist) Earlie Server, MD as Consulting Physician (Oncology)  REFERRING PROVIDER: [name], PA-C  CHIEF COMPLAINTS/REASON FOR VISIT:  Follow-up for pulmonary embolism.   HISTORY OF PRESENTING ILLNESS:  Ronald Malone is a 64 y.o. male who was seen in consultation at the request of Pollak, Adriana M, PA-C for evaluation of pulmonary embolism and DVT. Patient was diagnosed with acute pulmonary embolism on 09/01/2018. Patient's presenting symptoms included left sided upper chest wall pain, pruritic, cough, SOB.   09/01/2018 CT angio chest showed non conclusive pulmonary embolus at the bifurcation of the right middle and lower lobe pulmonary arteries. Punctate embolus with a lingular branch.  Extensive coronary artery disease, emphysema, hepatic steatosis.  09/01/2018 US venous bilateral lower extremities showed non occlusive DVT popliteal vein.  2D echo showed LVEF 60%, no strain pattern.  Patient was started on therapeutic lovenox and switched to Eliquis.  Hypercoagulable work-up showed negative factor V Leiden mutation and prothrombin gene mutation. Age appropriate cancer screening:   Smokes 2 packs a day never had colonoscopy done.  Patient also has a peripheral polyneuropathy.  From diabetes. Patient follows up with vascular surgery for chronic hip and buttock claudication as well as neuropathy symptoms in the feet and ankles.  Patient underwent angiography for further evaluation on 10/23/2018.  Mired peripheral arterial disease.  INTERVAL HISTORY Ronald Malone is a 64 y.o. male who has above history reviewed by me today presents for follow up visit for management of history of DVT and PE Problems and complaints are listed below: Patient reports feeling  shortness of breath which is chronic, exacerbated with exertion.  He also complains hip and knee pains. He is currently on Eliquis 2.5 mg twice daily for anticoagulation maintenance.  No other new complaints  Review of Systems  Constitutional: Positive for fatigue. Negative for appetite change, chills, fever and unexpected weight change.  HENT:   Negative for hearing loss and voice change.   Eyes: Negative for eye problems and icterus.  Respiratory: Negative for chest tightness, cough and shortness of breath.   Cardiovascular: Negative for chest pain and leg swelling.  Gastrointestinal: Negative for abdominal distention and abdominal pain.  Endocrine: Negative for hot flashes.  Genitourinary: Negative for difficulty urinating, dysuria and frequency.   Musculoskeletal: Positive for arthralgias.  Skin: Negative for itching and rash.       Right lower extremity patchy redness  Neurological: Positive for numbness. Negative for light-headedness.  Hematological: Negative for adenopathy. Does not bruise/bleed easily.  Psychiatric/Behavioral: Negative for confusion.    MEDICAL HISTORY:  Past Medical History:  Diagnosis Date  . CHF (congestive heart failure) ([place])   . Clotting disorder ([place])   . MI (myocardial infarction) ([place])   . Pulmonary embolism ([place])     SURGICAL HISTORY: Past Surgical History:  Procedure Laterality Date  . CARDIAC CATHETERIZATION    . COLON SURGERY     1/3 colon removed as an infant  . COLONOSCOPY WITH PROPOFOL N/A 12/26/2018   Procedure: COLONOSCOPY WITH PROPOFOL;  Surgeon: Lucilla Lame, MD;  Location: [facility] ENDOSCOPY;  Service: Endoscopy;  Laterality: N/A;  . LOWER EXTREMITY ANGIOGRAPHY Right 10/23/2018   Procedure: LOWER EXTREMITY ANGIOGRAPHY;  Surgeon: Algernon Huxley, MD;  Location: [facility];  Service: Cardiovascular;  Laterality: Right;    SOCIAL HISTORY: Social History  Socioeconomic History  . Marital status: Married    Spouse name: [name]     . Number of children: 2  . Years of education: Not on file  . Highest education level: Not on file  Occupational History  . Occupation: RETIRED   Tobacco Use  . Smoking status: Current Every Day Smoker    Packs/day: 3.00    Years: 45.00    Pack years: 135.00    Types: Cigarettes    Last attempt to quit: 09/15/2018    Years since quitting: 0.9  . Smokeless tobacco: Never Used  Substance and Sexual Activity  . Alcohol use: Yes  . Drug use: Yes    Types: Psilocybin  . Sexual activity: Not on file  Other Topics Concern  . Not on file  Social History Narrative  . Not on file   Social Determinants of Health   Financial Resource Strain: Low Risk   . Difficulty of Paying Living Expenses: Not hard at all  Food Insecurity: No Food Insecurity  . Worried About Charity fundraiser in the Last Year: Never true  . Ran Out of Food in the Last Year: Never true  Transportation Needs: No Transportation Needs  . Lack of Transportation (Medical): No  . Lack of Transportation (Non-Medical): No  Physical Activity:   . Days of Exercise per Week:   . Minutes of Exercise per Session:   Stress: No Stress Concern Present  . Feeling of Stress : Only a little  Social Connections: Unknown  . Frequency of Communication with Friends and Family: More than three times a week  . Frequency of Social Gatherings with Friends and Family: Not on file  . Attends Religious Services: Not on file  . Active Member of Clubs or Organizations: Not on file  . Attends Archivist Meetings: Not on file  . Marital Status: Not on file  Intimate Partner Violence: Unknown  . Fear of Current or Ex-Partner: Not on file  . Emotionally Abused: No  . Physically Abused: No  . Sexually Abused: No    FAMILY HISTORY: Family History  Problem Relation Age of Onset  . Parkinson's disease Mother   . Dementia Father     ALLERGIES:  has No Known Allergies.  MEDICATIONS:  Current Outpatient Medications  Medication  Sig Dispense Refill  . ACCU-CHEK GUIDE test strip USE ONCE DAILY IN MORNING (E11.9) 100 strip 0  . apixaban (ELIQUIS) 2.5 MG TABS tablet Take 1 tablet (2.5 mg total) by mouth 2 (two) times daily. 180 tablet 1  . blood glucose meter kit and supplies Dispense based on patient and insurance preference. Use once daily in the morning. (FOR ICD-10 E11.9). 1 each 0  . metFORMIN (GLUCOPHAGE XR) 500 MG 24 hr tablet Take 2 tablets (1,000 mg total) by mouth 2 (two) times daily. 360 tablet 1  . gabapentin (NEURONTIN) 300 MG capsule Take 1 capsule (300 mg total) by mouth at bedtime. (Patient not taking: Reported on 08/21/2019) 90 capsule 1  . nitroGLYCERIN (NITROSTAT) 0.4 MG SL tablet PLACE 1 TABLET (0.4 MG TOTAL) UNDER THE TONGUE EVERY 5 (FIVE) MINUTES AS NEEDED FOR CHEST PAIN. (Patient not taking: Reported on 12/26/2018) 300 tablet 1   No current facility-administered medications for this visit.     PHYSICAL EXAMINATION: ECOG PERFORMANCE STATUS: 1 - Symptomatic but completely ambulatory Vitals:   08/21/19 1012  BP: 122/78  Pulse: 90  Resp: 18  Temp: (!) 97.3 F (36.3 C)   Filed Weights  08/21/19 1012  Weight: (!) 331 lb 9.6 oz (150.4 kg)    Physical Exam Constitutional:      General: He is not in acute distress.    Comments: Morbidly obese  HENT:     Head: Normocephalic and atraumatic.  Eyes:     General: No scleral icterus.    Pupils: Pupils are equal, round, and reactive to light.  Cardiovascular:     Rate and Rhythm: Normal rate and regular rhythm.     Heart sounds: Normal heart sounds.  Pulmonary:     Effort: Pulmonary effort is normal. No respiratory distress.     Breath sounds: No wheezing.  Abdominal:     General: Bowel sounds are normal. There is no distension.     Palpations: Abdomen is soft. There is no mass.     Tenderness: There is no abdominal tenderness.  Musculoskeletal:        General: No deformity. Normal range of motion.     Cervical back: Normal range of motion  and neck supple.     Comments: Trace right lower extremity edema.   Skin:    General: Skin is warm and dry.     Findings: No rash.     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     Cranial Nerves: No cranial nerve deficit.     Coordination: Coordination normal.  Psychiatric:        Mood and Affect: Mood normal.        Thought Content: Thought content normal.     RADIOGRAPHIC STUDIES: I have personally reviewed the radiological images as listed and agreed with the findings in the report. No results found.   LABORATORY DATA:  I have reviewed the data as listed Lab Results  Component Value Date   WBC 7.7 08/21/2019   HGB 14.4 08/21/2019   HCT 43.0 08/21/2019   MCV 92.5 08/21/2019   PLT 270 08/21/2019   Recent Labs    09/02/18 0645 10/18/18 1052 10/23/18 0842 02/20/19 1136 08/21/19 0956  NA  --  140  --  138 139  K  --  4.4  --  3.9 4.1  CL  --  103  --  103 103  CO2  --  21  --  27 26  GLUCOSE  --  148*  --  194* 166*  BUN   < > 24 19 24* 18  CREATININE   < > 0.90 0.84 0.93 1.04  CALCIUM  --  9.1  --  9.3 8.8*  GFRNONAA   < > 89 >60 >60 >60  GFRAA   < > 103 >60 >60 >60  PROT  --  6.5  --  7.4 7.6  ALBUMIN  --  4.0  --  3.9 3.9  AST  --  14  --  20 26  ALT  --  20  --  27 39  ALKPHOS  --  94  --  84 66  BILITOT  --  0.5  --  0.7 0.7   < > = values in this interval not displayed.   Iron/TIBC/Ferritin/ %Sat No results found for: IRON, TIBC, FERRITIN, IRONPCTSAT     ASSESSMENT & PLAN:  1. History of pulmonary embolism   2. History of deep vein thrombosis (DVT) of lower extremity   3. Chronic anticoagulation   4. 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 Patient is currently on Eliquis 2.5 mg twice daily for long-term anticoagulation maintenance. He tolerates well. Labs are  reviewed and discussed with patient.  Continue current regimen.  Refills will be sent to his pharmacy.  Morbid obesity, discussed about weight loss. Shortness of breath with exertion is chronic symptom for him.  Likely multifactorial due to emphysema/underlying lung disease, obesity etc.  smoking cessation was discussed with patient.  He is not motivated.  Venous stasis of lower extremity.  Leg elevation recommended.  Compression stocking was discussed.  He is not interested.  Orders Placed This Encounter  Procedures  . CBC with Differential/Platelet    Standing Status:   Future    Standing Expiration Date:   08/20/2020  . Comprehensive metabolic panel    Standing Status:   Future    Standing Expiration Date:   08/20/2020    All questions were answered. The patient knows to call the clinic with any problems questions or concerns. Follow-up in 6 months.  Earlie Server, MD, PhD 08/21/2019

## 2019-09-04 ENCOUNTER — Encounter: Payer: Self-pay | Admitting: Physician Assistant

## 2019-09-04 DIAGNOSIS — E119 Type 2 diabetes mellitus without complications: Secondary | ICD-10-CM

## 2019-09-04 DIAGNOSIS — G6289 Other specified polyneuropathies: Secondary | ICD-10-CM

## 2019-09-04 DIAGNOSIS — I208 Other forms of angina pectoris: Secondary | ICD-10-CM | POA: Diagnosis not present

## 2019-09-04 DIAGNOSIS — I2510 Atherosclerotic heart disease of native coronary artery without angina pectoris: Secondary | ICD-10-CM | POA: Diagnosis not present

## 2019-09-04 DIAGNOSIS — I824Z9 Acute embolism and thrombosis of unspecified deep veins of unspecified distal lower extremity: Secondary | ICD-10-CM | POA: Diagnosis not present

## 2019-09-04 MED ORDER — METFORMIN HCL ER 500 MG PO TB24: 1000.0000 mg | ORAL_TABLET | Freq: Two times a day (BID) | ORAL | 1 refills | Status: DC

## 2019-09-04 MED ORDER — GABAPENTIN 300 MG PO CAPS: 300.0000 mg | ORAL_CAPSULE | Freq: Every day | ORAL | 1 refills | Status: DC

## 2019-09-06 ENCOUNTER — Emergency Department: Payer: Medicare Other

## 2019-09-06 ENCOUNTER — Encounter: Payer: Self-pay | Admitting: Emergency Medicine

## 2019-09-06 ENCOUNTER — Emergency Department
Admission: EM | Admit: 2019-09-06 | Discharge: 2019-09-06 | Disposition: A | Discharge status: Home / Self Care | Payer: Medicare Other | Attending: Emergency Medicine | Admitting: Emergency Medicine

## 2019-09-06 ENCOUNTER — Other Ambulatory Visit: Payer: Self-pay

## 2019-09-06 DIAGNOSIS — S86922A Laceration of unspecified muscle(s) and tendon(s) at lower leg level, left leg, initial encounter: Secondary | ICD-10-CM | POA: Diagnosis present

## 2019-09-06 DIAGNOSIS — F17210 Nicotine dependence, cigarettes, uncomplicated: Secondary | ICD-10-CM | POA: Insufficient documentation

## 2019-09-06 DIAGNOSIS — S71112A Laceration without foreign body, left thigh, initial encounter: Secondary | ICD-10-CM | POA: Diagnosis not present

## 2019-09-06 DIAGNOSIS — Y92410 Unspecified street and highway as the place of occurrence of the external cause: Secondary | ICD-10-CM | POA: Insufficient documentation

## 2019-09-06 DIAGNOSIS — I509 Heart failure, unspecified: Secondary | ICD-10-CM | POA: Diagnosis not present

## 2019-09-06 DIAGNOSIS — Y9355 Activity, bike riding: Secondary | ICD-10-CM | POA: Diagnosis not present

## 2019-09-06 DIAGNOSIS — S199XXA Unspecified injury of neck, initial encounter: Secondary | ICD-10-CM | POA: Diagnosis not present

## 2019-09-06 DIAGNOSIS — Y999 Unspecified external cause status: Secondary | ICD-10-CM | POA: Insufficient documentation

## 2019-09-06 DIAGNOSIS — S80811A Abrasion, right lower leg, initial encounter: Secondary | ICD-10-CM | POA: Diagnosis not present

## 2019-09-06 DIAGNOSIS — S81812A Laceration without foreign body, left lower leg, initial encounter: Secondary | ICD-10-CM | POA: Diagnosis not present

## 2019-09-06 DIAGNOSIS — Z7984 Long term (current) use of oral hypoglycemic drugs: Secondary | ICD-10-CM | POA: Insufficient documentation

## 2019-09-06 DIAGNOSIS — R519 Headache, unspecified: Secondary | ICD-10-CM | POA: Diagnosis not present

## 2019-09-06 DIAGNOSIS — S82831A Other fracture of upper and lower end of right fibula, initial encounter for closed fracture: Secondary | ICD-10-CM | POA: Diagnosis not present

## 2019-09-06 DIAGNOSIS — E1165 Type 2 diabetes mellitus with hyperglycemia: Secondary | ICD-10-CM | POA: Insufficient documentation

## 2019-09-06 DIAGNOSIS — S8992XA Unspecified injury of left lower leg, initial encounter: Secondary | ICD-10-CM | POA: Diagnosis not present

## 2019-09-06 LAB — COMPREHENSIVE METABOLIC PANEL
ALT: 38 U/L (ref 0–44)
AST: 36 U/L (ref 15–41)
Albumin: 3.9 g/dL (ref 3.5–5.0)
Alkaline Phosphatase: 59 U/L (ref 38–126)
Anion gap: 16 — ABNORMAL HIGH (ref 5–15)
BUN: 22 mg/dL (ref 8–23)
CO2: 20 mmol/L — ABNORMAL LOW (ref 22–32)
Calcium: 9.2 mg/dL (ref 8.9–10.3)
Chloride: 103 mmol/L (ref 98–111)
Creatinine, Ser: 1.29 mg/dL — ABNORMAL HIGH (ref 0.61–1.24)
GFR calc Af Amer: >60 mL/min (ref >60)
GFR calc non Af Amer: 57 mL/min — ABNORMAL LOW (ref >60)
Glucose, Bld: 150 mg/dL — ABNORMAL HIGH (ref 70–99)
Potassium: 4.5 mmol/L (ref 3.5–5.1)
Sodium: 139 mmol/L (ref 135–145)
Total Bilirubin: 0.6 mg/dL (ref 0.3–1.2)
Total Protein: 7.4 g/dL (ref 6.5–8.1)

## 2019-09-06 LAB — URINALYSIS, COMPLETE (UACMP) WITH MICROSCOPIC
Bilirubin Urine: NEGATIVE
Glucose, UA: NEGATIVE mg/dL
Hgb urine dipstick: NEGATIVE
Ketones, ur: 5 mg/dL — AB
Leukocytes,Ua: NEGATIVE
Nitrite: NEGATIVE
Protein, ur: NEGATIVE mg/dL
Specific Gravity, Urine: 1.02 (ref 1.005–1.030)
pH: 5 (ref 5.0–8.0)

## 2019-09-06 LAB — CBC WITH DIFFERENTIAL/PLATELET
Abs Immature Granulocytes: 0.04 10*3/uL (ref 0.00–0.07)
Basophils Absolute: 0.1 10*3/uL (ref 0.0–0.1)
Basophils Relative: 1 %
Eosinophils Absolute: 0.3 10*3/uL (ref 0.0–0.5)
Eosinophils Relative: 3 %
HCT: 43.8 % (ref 39.0–52.0)
Hemoglobin: 14.1 g/dL (ref 13.0–17.0)
Immature Granulocytes: 0 %
Lymphocytes Relative: 24 %
Lymphs Abs: 2.5 10*3/uL (ref 0.7–4.0)
MCH: 30.5 pg (ref 26.0–34.0)
MCHC: 32.2 g/dL (ref 30.0–36.0)
MCV: 94.6 fL (ref 80.0–100.0)
Monocytes Absolute: 0.9 10*3/uL (ref 0.1–1.0)
Monocytes Relative: 9 %
Neutro Abs: 6.8 10*3/uL (ref 1.7–7.7)
Neutrophils Relative %: 63 %
Platelets: 312 10*3/uL (ref 150–400)
RBC: 4.63 MIL/uL (ref 4.22–5.81)
RDW: 13.3 % (ref 11.5–15.5)
WBC: 10.5 10*3/uL (ref 4.0–10.5)
nRBC: 0 % (ref 0.0–0.2)

## 2019-09-06 LAB — PROTIME-INR
INR: 0.9 (ref 0.8–1.2)
Prothrombin Time: 12.2 seconds (ref 11.4–15.2)

## 2019-09-06 LAB — APTT: aPTT: 29 seconds (ref 24–36)

## 2019-09-06 MED ORDER — LIDOCAINE-EPINEPHRINE 2 %-1:100000 IJ SOLN
20.0000 mL | Freq: Once | INTRAMUSCULAR | Status: DC
  Filled 2019-09-06: qty 1

## 2019-09-06 MED ORDER — HYDROMORPHONE HCL 1 MG/ML IJ SOLN
0.5000 mg | Freq: Once | INTRAMUSCULAR | Status: AC
  Administered 2019-09-06: 0.5 mg via INTRAVENOUS
  Filled 2019-09-06: qty 1

## 2019-09-06 MED ORDER — ONDANSETRON HCL 4 MG/2ML IJ SOLN
4.0000 mg | Freq: Once | INTRAMUSCULAR | Status: AC
  Administered 2019-09-06: 4 mg via INTRAVENOUS
  Filled 2019-09-06: qty 2

## 2019-09-06 MED ORDER — OXYCODONE HCL 5 MG PO TABS
5.0000 mg | ORAL_TABLET | Freq: Once | ORAL | Status: AC
  Administered 2019-09-06: 5 mg via ORAL
  Filled 2019-09-06: qty 1

## 2019-09-06 MED ORDER — CEPHALEXIN 250 MG PO CAPS: 250.0000 mg | ORAL_CAPSULE | Freq: Four times a day (QID) | ORAL | 0 refills | Status: AC

## 2019-09-06 MED ORDER — TETANUS-DIPHTHERIA TOXOIDS TD 5-2 LFU IM INJ
0.5000 mL | INJECTION | Freq: Once | INTRAMUSCULAR | Status: DC
  Filled 2019-09-06: qty 0.5

## 2019-09-06 MED ORDER — OXYCODONE HCL 5 MG PO TABS: 5.0000 mg | ORAL_TABLET | Freq: Four times a day (QID) | ORAL | 0 refills | Status: AC | PRN
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
## 2019-09-06 NOTE — ED Notes (Signed)
Pt ambulatory at this time, denies pain in right ankle or buttock. Pt ambulates to room for exam by Dr. Jari Pigg, then down hall to restroom to provide urine sample. States he feels "great" and reports being ready to go home.

## 2019-09-06 NOTE — Discharge Instructions (Addendum)
Your laceration will need to have the sutures removed in 7 to 10 days.  You were 20 1 sutures.  Take the Prevent infection and the oxycodone to help with pain.  Return to the ER for worsening abdominal pain, back pain.  If you start having pain in your ankle you should follow with orthopedic surgery and stay off your ankle continue follow-up with them.  IMPRESSION: Fracture deformity of the distal right fibula of indeterminate age.

## 2019-09-06 NOTE — ED Notes (Signed)
tenvac not in pyxis and did not arrive from pharmacy by time of discharge.
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
## 2019-09-06 NOTE — ED Notes (Signed)
E signature pad not working. Pt educated on discharge instructions and wound care and verbalized understanding.

## 2019-09-07 ENCOUNTER — Encounter: Payer: Self-pay | Admitting: Physician Assistant

## 2019-09-18 ENCOUNTER — Encounter: Payer: Self-pay | Admitting: Physician Assistant

## 2019-09-18 ENCOUNTER — Ambulatory Visit (INDEPENDENT_AMBULATORY_CARE_PROVIDER_SITE_OTHER): Payer: Medicare Other | Admitting: Surgery

## 2019-09-18 ENCOUNTER — Ambulatory Visit (INDEPENDENT_AMBULATORY_CARE_PROVIDER_SITE_OTHER): Payer: Medicare Other | Admitting: Physician Assistant

## 2019-09-18 ENCOUNTER — Encounter: Payer: Self-pay | Admitting: Surgery

## 2019-09-18 ENCOUNTER — Other Ambulatory Visit: Payer: Self-pay

## 2019-09-18 VITALS — BP 150/78 | HR 76 | Temp 97.1°F | Wt 332.0 lb

## 2019-09-18 DIAGNOSIS — Z23 Encounter for immunization: Secondary | ICD-10-CM | POA: Diagnosis not present

## 2019-09-18 DIAGNOSIS — Z4802 Encounter for removal of sutures: Secondary | ICD-10-CM

## 2019-09-18 DIAGNOSIS — S81812A Laceration without foreign body, left lower leg, initial encounter: Secondary | ICD-10-CM

## 2019-09-18 DIAGNOSIS — S81812D Laceration without foreign body, left lower leg, subsequent encounter: Secondary | ICD-10-CM

## 2019-09-18 MED ORDER — SILVER SULFADIAZINE 1 % EX CREA
TOPICAL_CREAM | CUTANEOUS | 1 refills | Status: DC
Start: 2019-09-18 — End: 2019-11-06

## 2019-09-18 MED ORDER — DOXYCYCLINE HYCLATE 100 MG PO TABS: 100.0000 mg | ORAL_TABLET | Freq: Two times a day (BID) | ORAL | 0 refills | Status: DC

## 2019-09-18 NOTE — Addendum Note (Signed)
Addended by: Casimer Leek C on: 09/18/2019 11:17 AM   Modules accepted: Orders
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
## 2019-09-18 NOTE — Patient Instructions (Addendum)
Keep your leg elevated as much as possible. Start your antibiotics.   Remove your dressing and shower. Apply the silvadene cream and re wrap the leg. Do this twice a day.  Wrap your leg with the ace bandage during the day to help with blood flow.   Follow up next week
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
## 2019-09-18 NOTE — Patient Instructions (Signed)
Suture Removal, Care After This sheet gives you information about how to care for yourself after your procedure. Your health care provider may also give you more specific instructions. If you have problems or questions, contact your health care provider. What can I expect after the procedure? After your stitches (sutures) are removed, it is common to have:  Some discomfort and swelling in the area.  Slight redness in the area. Follow these instructions at home: If you have a bandage:  Wash your hands with soap and water before you change your bandage (dressing). If soap and water are not available, use hand sanitizer.  Change your dressing as told by your health care provider. If your dressing becomes wet or dirty, or develops a bad smell, change it as soon as possible.  If your dressing sticks to your skin, soak it in warm water to loosen it. Wound care   Check your wound every day for signs of infection. Check for: ? More redness, swelling, or pain. ? Fluid or blood. ? Warmth. ? Pus or a bad smell.  Wash your hands with soap and water before and after touching your wound.  Apply cream or ointment only as directed by your health care provider. If you are using cream or ointment, wash the area with soap and water 2 times a day to remove all the cream or ointment. Rinse off the soap and pat the area dry with a clean towel.  If you have skin glue or adhesive strips on your wound, leave these closures in place. They may need to stay in place for 2 weeks or longer. If adhesive strip edges start to loosen and curl up, you may trim the loose edges. Do not remove adhesive strips completely unless your health care provider tells you to do that.  Keep the wound area dry and clean. Do not take baths, swim, or use a hot tub until your health care provider approves.  Continue to protect the wound from injury.  Do not pick at your wound. Picking can cause an infection.  When your wound has  completely healed, wear sunscreen over it or cover it with clothing when you are outside. New scars get sunburned easily, which can make scarring worse. General instructions  Take over-the-counter and prescription medicines only as told by your health care provider.  Keep all follow-up visits as told by your health care provider. This is important. Contact a health care provider if:  You have redness, swelling, or pain around your wound.  You have fluid or blood coming from your wound.  Your wound feels warm to the touch.  You have pus or a bad smell coming from your wound.  Your wound opens up. Get help right away if:  You have a fever.  You have redness that is spreading from your wound. Summary  After your sutures are removed, it is common to have some discomfort and swelling in the area.  Wash your hands with soap and water before you change your bandage (dressing).  Keep the wound area dry and clean. Do not take baths, swim, or use a hot tub until your health care provider approves. This information is not intended to replace advice given to you by your health care provider. Make sure you discuss any questions you have with your health care provider. Document Revised: 02/11/2017 Document Reviewed: 04/06/2016 Elsevier Patient Education  2020 Elsevier Inc.  

## 2019-09-25 ENCOUNTER — Encounter: Payer: Self-pay | Admitting: Surgery

## 2019-09-25 ENCOUNTER — Ambulatory Visit (INDEPENDENT_AMBULATORY_CARE_PROVIDER_SITE_OTHER): Payer: Medicare Other | Admitting: Surgery

## 2019-09-25 ENCOUNTER — Other Ambulatory Visit: Payer: Self-pay

## 2019-09-25 VITALS — BP 150/79 | HR 96 | Temp 98.0°F | Resp 16 | Ht 72.0 in | Wt 331.0 lb

## 2019-09-25 DIAGNOSIS — S81812D Laceration without foreign body, left lower leg, subsequent encounter: Secondary | ICD-10-CM

## 2019-09-25 MED ORDER — SILVER SULFADIAZINE 1 % EX CREA
TOPICAL_CREAM | CUTANEOUS | 1 refills | Status: DC
Start: 2019-09-25 — End: 2019-11-15

## 2019-09-25 NOTE — Progress Notes (Signed)
Surgical Clinic Progress/Follow-up Note   HPI:  64 y.o. Male presents to clinic for wound care follow-up 1 weeks follow the last evaluation. Patient reports improvement/resolution of prior issues and has been performing twice daily Silvadene dressing changes, he reported having showered this morning, did not apply Silvadene but reapplied his nonstick dressing prior to coming in.  Denies fever/chills.  We will complete his last day of oral antibiotics tomorrow.  Reports he needs additional Silvadene.  He reports is difficult elevating his legs above his heart, he admits to poor compliance with compressive wraps, primarily due to his own disability and his wife's inability to assist him.  Review of Systems:  Constitutional: denies fever/chills  ENT: denies sore throat, hearing problems  Respiratory: denies shortness of breath, wheezing  Cardiovascular: denies chest pain, palpitations  Gastrointestinal: denies abdominal pain, N/V, or diarrhea/and bowel function changes. Skin: Denies any other rashes or skin discolorations except post-traumatic  wounds Vital Signs:  BP (!) 150/79   Pulse 96   Temp 98 F (36.7 C) (Oral)   Resp 16   Ht 6' (1.829 m)   Wt (!) 331 lb (150.1 kg)   SpO2 96%   BMI 44.89 kg/m    Physical Exam:  Constitutional:  -- Obese body habitus  -- Awake, alert, and oriented x3  Pulmonary:  -- No crackles -- Equal breath sounds bilaterally -- Breathing non-labored at rest Cardiovascular:  -- S1, S2 present  -- No pericardial rubs  Gastrointestinal:  -- Soft and non-distended, non-tender well-rounded. Musculoskeletal / Integumentary:  -- Wounds: His left pretibial/medial calf wound has no erythema, no malodorous discharge.  There is some soft moist eschar at the periphery of the wound, fibrinous exudates fill the curved wound.  With application of wound cleanser, he was able to tolerate excisional debridement of the skin and subcutaneous tissues with a bone curette,  some punctate bleeding was obtained but easily controlled with pressure.  He tolerated this well.  Some of the liquefactive necrosis of the adjacent eschar was still quite adherent.  His distal extremity edema was improved.   He has a dry crust on his right ankle, with no evidence of discharge, drainage or erythema.  Appears to be healing well. -- Extremities: B/L UE and LE FROM, hands and feet warm  Laboratory studies: Reports his last hemoglobin A1c was less than 7.  Otherwise does not monitor his blood sugars routinely.  Imaging: No new pertinent imaging available for review   Assessment:  64 y.o. yo Malee with a problem list including...  Patient Active Problem List   Diagnosis Date Noted  . Laceration of calf 09/18/2019  . Type 2 diabetes mellitus with hyperglycemia, without long-term current use of insulin ([place]) 05/17/2019  . Morbid obesity ([place]) 05/17/2019  . Diabetes mellitus without complication ([place]) 32/44/0102  . Positive colorectal cancer screening using Cologuard test   . Bilateral leg numbness 10/16/2018  . History of MI (myocardial infarction) 10/05/2018  . Pulmonary emphysema ([place]) 10/05/2018  . H/O heart artery stent 10/05/2018  . Peripheral artery disease ([place]) 09/26/2018  . DVT (deep venous thrombosis) ([place]) 09/19/2018  . History of tobacco abuse 09/19/2018  . Pain in limb 09/19/2018  . Acute pulmonary embolism ([place]) 09/01/2018  . Fatigue 08/29/2013  . MI (myocardial infarction) ([employer]) 08/29/2013    presents to clinic for follow-up evaluation of left calf posttraumatic wound, progressing well.  Plan:              - return to clinic  in 1 week or as needed, instructed to call office if any questions or concerns After the excisional debridement, we cleansed his wound, applied Silvadene, Telfa and wrapped with Kerlix and secured with an Ace wrap beginning from the toes and extending to the proximal calf.  He tolerated this well. All of the above recommendations were  discussed with the patient and patient's family, and all of patient's and family's questions were answered to his/her/their expressed satisfaction.  [name], MD, FACS [place]: [facility] for exceptional care. Office: [phone]

## 2019-09-25 NOTE — Patient Instructions (Addendum)
Please wear the compression stockings and keep your leg elevated. Please use ace wrap this will help with keeping the swelling down.   Please see your follow up appointment listed below.

## 2019-10-02 ENCOUNTER — Encounter: Payer: Self-pay | Admitting: Surgery

## 2019-10-02 ENCOUNTER — Other Ambulatory Visit: Payer: Self-pay

## 2019-10-02 ENCOUNTER — Ambulatory Visit (INDEPENDENT_AMBULATORY_CARE_PROVIDER_SITE_OTHER): Payer: Medicare Other | Admitting: Surgery

## 2019-10-02 VITALS — BP 142/67 | HR 97 | Temp 98.4°F | Ht 72.0 in | Wt 332.0 lb

## 2019-10-02 DIAGNOSIS — S81812D Laceration without foreign body, left lower leg, subsequent encounter: Secondary | ICD-10-CM | POA: Diagnosis not present

## 2019-10-02 NOTE — Patient Instructions (Addendum)
Dr.Rodenberg sent over referral to [facility] at today's visit. Facility will contact you once referral received. Dr.Rodenberg discussed with patient to continue to elevate legs to help with circulation and prevent swelling. Dr.Rodenberg cleaned out wound at today's visit.  Wound Care, Adult Taking care of your wound properly can help to prevent pain, infection, and scarring. It can also help your wound to heal more quickly. How to care for your wound Wound care      Follow instructions from your health care provider about how to take care of your wound. Make sure you: ? Wash your hands with soap and water before you change the bandage (dressing). If soap and water are not available, use hand sanitizer. ? Change your dressing as told by your health care provider. ? Leave stitches (sutures), skin glue, or adhesive strips in place. These skin closures may need to stay in place for 2 weeks or longer. If adhesive strip edges start to loosen and curl up, you may trim the loose edges. Do not remove adhesive strips completely unless your health care provider tells you to do that.  Check your wound area every day for signs of infection. Check for: ? Redness, swelling, or pain. ? Fluid or blood. ? Warmth. ? Pus or a bad smell.  Ask your health care provider if you should clean the wound with mild soap and water. Doing this may include: ? Using a clean towel to pat the wound dry after cleaning it. Do not rub or scrub the wound. ? Applying a cream or ointment. Do this only as told by your health care provider. ? Covering the incision with a clean dressing.  Ask your health care provider when you can leave the wound uncovered.  Keep the dressing dry until your health care provider says it can be removed. Do not take baths, swim, use a hot tub, or do anything that would put the wound underwater until your health care provider approves. Ask your health care provider if you can take showers. You  may only be allowed to take sponge baths. Medicines   If you were prescribed an antibiotic medicine, cream, or ointment, take or use the antibiotic as told by your health care provider. Do not stop taking or using the antibiotic even if your condition improves.  Take over-the-counter and prescription medicines only as told by your health care provider. If you were prescribed pain medicine, take it 30 or more minutes before you do any wound care or as told by your health care provider. General instructions  Return to your normal activities as told by your health care provider. Ask your health care provider what activities are safe.  Do not scratch or pick at the wound.  Do not use any products that contain nicotine or tobacco, such as cigarettes and e-cigarettes. These may delay wound healing. If you need help quitting, ask your health care provider.  Keep all follow-up visits as told by your health care provider. This is important.  Eat a diet that includes protein, vitamin A, vitamin C, and other nutrient-rich foods to help the wound heal. ? Foods rich in protein include meat, dairy, beans, nuts, and other sources. ? Foods rich in vitamin A include carrots and dark green, leafy vegetables. ? Foods rich in vitamin C include citrus, tomatoes, and other fruits and vegetables. ? Nutrient-rich foods have protein, carbohydrates, fat, vitamins, or minerals. Eat a variety of healthy foods including vegetables, fruits, and whole grains. Contact a  health care provider if:  You received a tetanus shot and you have swelling, severe pain, redness, or bleeding at the injection site.  Your pain is not controlled with medicine.  You have redness, swelling, or pain around the wound.  You have fluid or blood coming from the wound.  Your wound feels warm to the touch.  You have pus or a bad smell coming from the wound.  You have a fever or chills.  You are nauseous or you vomit.  You are  dizzy. Get help right away if:  You have a red streak going away from your wound.  The edges of the wound open up and separate.  Your wound is bleeding, and the bleeding does not stop with gentle pressure.  You have a rash.  You faint.  You have trouble breathing. Summary  Always wash your hands with soap and water before changing your bandage (dressing).  To help with healing, eat foods that are rich in protein, vitamin A, vitamin C, and other nutrients.  Check your wound every day for signs of infection. Contact your health care provider if you suspect that your wound is infected. This information is not intended to replace advice given to you by your health care provider. Make sure you discuss any questions you have with your health care provider. Document Revised: 06/19/2018 Document Reviewed: 09/16/2015 Elsevier Patient Education  [place].

## 2019-10-02 NOTE — Progress Notes (Signed)
This gentleman returns today in follow-up of his left leg wound.  He has been been performing his Silvadene dressing changes, he reports he is reapplying the Ace wrap, however he presents today having recently showered and did not reapply the ace.  He reports he is having a hard time keeping his leg elevated, he does spend time in his recliner but admits that this does not elevated above his heart.  He is uncertain terms of how much progress he is making. He denies fevers and chills and denies any worsening pain or tenderness. On examination his wound is curvilinear across the left medial/anterior calf.  There is no erythema, there is no evidence of induration or discharge.  The wound is filled with a fibrinous exudate/biofilm.  The perimeter of the skin margins are all pink and healthy with the exception of couple tiny regions that still have some lingering epidermal eschar.  These are all quite thin.  He does not have any pitting edema distally but he does have some extensive calf edema bilaterally which is not easily compressible.  His wound measures 104 mm from tip to tip, there is a width at the widest point of 15 mm, and its greatest depth is 15 mm.  Topical application of Xylocaine jelly is applied and with a bone curette I performed an excisional debridement of skin and subcutaneous tissues throughout the entirety of the wound.  Obtained punctate bleeding throughout and identified granulation tissue present in about 65 to 70% of the wound.  I do not believe the wound is getting any significantly larger.  There is evidence of progress with granulation and with the skin perimeter.  We discussed referral to the wound care center, and he decided he would rather stay and continue follow-up with me in the meantime. I do not feel there is any role for antibiotics at this time.  He showed an interest in finding hydrogel/silver hydrogel.  As for now I believe Silvadene is the least expensive  alternative.  With adequate excisional debridement, and removal of all the exudative debris,, I then redressed his wound with Silvadene, applied a Telfa pad some absorbent gauze, then wrapped at all with Kerlix from his foot to his proximal calf.  I then applied to Ace wraps to assist with compression of this leg beginning at the midfoot extending to the proximal calf.  Overall he tolerated the procedure well.  And I have asked him to return in a week.

## 2019-10-09 ENCOUNTER — Ambulatory Visit (INDEPENDENT_AMBULATORY_CARE_PROVIDER_SITE_OTHER): Payer: Medicare Other | Admitting: Surgery

## 2019-10-09 ENCOUNTER — Encounter: Payer: Self-pay | Admitting: Surgery

## 2019-10-09 ENCOUNTER — Other Ambulatory Visit: Payer: Self-pay

## 2019-10-09 VITALS — BP 157/85 | HR 87 | Temp 97.8°F | Resp 14 | Wt 329.6 lb

## 2019-10-09 DIAGNOSIS — S81812D Laceration without foreign body, left lower leg, subsequent encounter: Secondary | ICD-10-CM

## 2019-10-09 NOTE — Progress Notes (Signed)
Surgical Clinic Progress/Follow-up Note   HPI:  64 y.o. Male presents to clinic for left leg laceration wound care follow-up  1 weeks follow the last evaluation. Patient reports improvement/resolution of prior issues and has been tolerating regular diet, denies N/V, fever/chills, CP, or SOB.  Review of Systems:  Constitutional: denies fever/chills  ENT: denies sore throat, hearing problems  Respiratory: denies shortness of breath, wheezing  Cardiovascular: denies chest pain, palpitations  Gastrointestinal: denies abdominal pain, N/V, or diarrhea/and bowel function changes Skin: Denies any other rashes or skin discolorations   Vital Signs:  BP (!) 157/85   Pulse 87   Temp 97.8 F (36.6 C)   Resp 14   Wt (!) 329 lb 9.6 oz (149.5 kg)   SpO2 96%   BMI 44.70 kg/m    Physical Exam:  Constitutional:  -- Obese body habitus  -- Awake, alert, and oriented x3  Pulmonary:  -- No crackles -- Equal breath sounds bilaterally -- Breathing non-labored at rest Cardiovascular:  -- S1, S2 present  -- No pericardial rubs  Gastrointestinal:  -- Soft and non-distended, non-tender Musculoskeletal / Integumentary:  -- Wounds or skin discoloration: Left open calf wound as previously noted curved in shape, widest at 2 cm, length remains at 9.7 cm with a depth of approximately 15 mm.  Topical anesthetic of 4% Xylocaine is applied, with informed consent excisional debridement was performed with a bone curette excising exudative debris and fibrinous exudates from the skin and subcutaneous tissues to deep fascia.  Identifying the over 90% granulation with punctate bright red bleeding.  There are couple small areas that are black/purple that I believe are small varicose veins, these were managed delicately.  No tunneling, tracking or evidence of ongoing infectious process.   I cut a piece of calcium alginate to fit the wound, applied gauze over it and secured it with an Ace wrap beginning at the forefoot and  wrapping it proximally to cover the wound.   He is to unwrap it and evaluate it in 2 days. -- Extremities: B/L UE and LE FROM, hands and feet warm,  Stable non-pitting edema   Laboratory studies: None  Imaging: No new pertinent imaging available for review   Assessment:  64 y.o. yo Male with a problem list including...  Patient Active Problem List   Diagnosis Date Noted  . Laceration of calf without complication, left, subsequent encounter 09/18/2019  . Type 2 diabetes mellitus with hyperglycemia, without long-term current use of insulin ([place]) 05/17/2019  . Morbid obesity ([place]) 05/17/2019  . Diabetes mellitus without complication ([place]) 76/54/6503  . Positive colorectal cancer screening using Cologuard test   . Bilateral leg numbness 10/16/2018  . History of MI (myocardial infarction) 10/05/2018  . Pulmonary emphysema ([place]) 10/05/2018  . H/O heart artery stent 10/05/2018  . Peripheral artery disease ([place]) 09/26/2018  . DVT (deep venous thrombosis) ([place]) 09/19/2018  . History of tobacco abuse 09/19/2018  . Pain in limb 09/19/2018  . Acute pulmonary embolism ([place]) 09/01/2018  . Fatigue 08/29/2013  . MI (myocardial infarction) ([place]) 08/29/2013    presents to clinic for follow-up evaluation of left leg wound, progressing well.  Granulating nicely, stable. Although it appears the wound is widening I believe we finally progressed to the point of greatest tissue destruction and are now making progress toward healing, however I have advised him that the persistent edema of his leg is diminishing her progress in terms of wound size.  Believe attempting to keep his leg wrapped for  longer periods of time and utilizing calcium alginate may be helpful.  If it appears the drainage is not keeping that wound moist we may have to return to utilizing Silvadene. Plan:              - return to clinic in 1 week or as needed, instructed to call office if any questions or concerns  All of the above  recommendations were discussed with the patient and patient's family, and all of patient's and family's questions were answered to his/her/their expressed satisfaction.  [name], MD, FACS [place]: [place] [facility]. Office: [phone]

## 2019-10-09 NOTE — Patient Instructions (Addendum)
Dr Christian Mate cleaned your wound today. Continue to elevate your leg to reduce swelling and wrap with ace bandage.  He placed calcium alginate in the wound area, placed a dry gauze over and wrapped with an ace bandage. You may keep this on until Thursday. You may alternate this with the silvadene cream.   See your appointment below, call the office if you have any questions or concerns.

## 2019-10-16 ENCOUNTER — Ambulatory Visit: Payer: Medicare Other | Admitting: Physician Assistant

## 2019-10-16 ENCOUNTER — Encounter: Payer: Self-pay | Admitting: Physician Assistant

## 2019-10-16 ENCOUNTER — Other Ambulatory Visit: Payer: Self-pay

## 2019-10-16 VITALS — BP 123/79 | HR 94 | Temp 98.6°F | Resp 12 | Ht 72.0 in | Wt 329.0 lb

## 2019-10-16 DIAGNOSIS — S81812D Laceration without foreign body, left lower leg, subsequent encounter: Secondary | ICD-10-CM | POA: Diagnosis not present

## 2019-10-16 NOTE — Patient Instructions (Signed)
Please continue to change the wound dressing daily.
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
## 2019-10-25 ENCOUNTER — Other Ambulatory Visit: Payer: Self-pay

## 2019-10-25 ENCOUNTER — Encounter: Payer: Self-pay | Admitting: Surgery

## 2019-10-25 ENCOUNTER — Ambulatory Visit (INDEPENDENT_AMBULATORY_CARE_PROVIDER_SITE_OTHER): Payer: Medicare Other | Admitting: Surgery

## 2019-10-25 VITALS — BP 137/90 | HR 93 | Temp 97.8°F | Ht 72.0 in | Wt 332.0 lb

## 2019-10-25 DIAGNOSIS — S81812D Laceration without foreign body, left lower leg, subsequent encounter: Secondary | ICD-10-CM

## 2019-10-25 NOTE — Progress Notes (Signed)
This gentleman returns today for follow-up of care for his left medial calf wound.  He currently wants to cancel his appointment for the wound care center in 2 weeks and continue his care here. He has showered again today is his usual, and does not rewrap his wound for presentation. I took a photo of his wound today after excisional debridement. What I noted prior to debridement was some crusting at the anteriormost extent of it, there was no adjacent erythema or remarkable discharge present.  It is 95% granulated, there is some fibrinous exudates present.  There is some contracture of the scar and it has clearly diminished in size compared to his last visit. Today it has a depth that continues at about 5 mm, however the width is at 17 mm with a horizontal length that 86 mm. Excisional debridement was completed to the skin and subcutaneous tissues utilizing a bone curette after application of topical 4% lidocaine jelly. Punctate bleeding was obtained throughout there were various small areas where granulation tissue was not present these were sharply debrided and all nonviable debris and soft tissues were removed.  He tolerated this well. We reapplied a Silvadene dressing and wrapped his ankle to calf with Kerlix and secured it with an Ace wrap beginning at the forefoot extending up to his knee. Due to a lack of office opportunities next week I will have him follow-up in 2 weeks.

## 2019-10-25 NOTE — Patient Instructions (Addendum)
Dr.Rodenberg suggested patient to use the Silvadene to the wound.   Wound Care, Adult Taking care of your wound properly can help to prevent pain, infection, and scarring. It can also help your wound to heal more quickly. How to care for your wound Wound care      Follow instructions from your health care provider about how to take care of your wound. Make sure you: ? Wash your hands with soap and water before you change the bandage (dressing). If soap and water are not available, use hand sanitizer. ? Change your dressing as told by your health care provider. ? Leave stitches (sutures), skin glue, or adhesive strips in place. These skin closures may need to stay in place for 2 weeks or longer. If adhesive strip edges start to loosen and curl up, you may trim the loose edges. Do not remove adhesive strips completely unless your health care provider tells you to do that.  Check your wound area every day for signs of infection. Check for: ? Redness, swelling, or pain. ? Fluid or blood. ? Warmth. ? Pus or a bad smell.  Ask your health care provider if you should clean the wound with mild soap and water. Doing this may include: ? Using a clean towel to pat the wound dry after cleaning it. Do not rub or scrub the wound. ? Applying a cream or ointment. Do this only as told by your health care provider. ? Covering the incision with a clean dressing.  Ask your health care provider when you can leave the wound uncovered.  Keep the dressing dry until your health care provider says it can be removed. Do not take baths, swim, use a hot tub, or do anything that would put the wound underwater until your health care provider approves. Ask your health care provider if you can take showers. You may only be allowed to take sponge baths. Medicines   If you were prescribed an antibiotic medicine, cream, or ointment, take or use the antibiotic as told by your health care provider. Do not stop taking or  using the antibiotic even if your condition improves.  Take over-the-counter and prescription medicines only as told by your health care provider. If you were prescribed pain medicine, take it 30 or more minutes before you do any wound care or as told by your health care provider. General instructions  Return to your normal activities as told by your health care provider. Ask your health care provider what activities are safe.  Do not scratch or pick at the wound.  Do not use any products that contain nicotine or tobacco, such as cigarettes and e-cigarettes. These may delay wound healing. If you need help quitting, ask your health care provider.  Keep all follow-up visits as told by your health care provider. This is important.  Eat a diet that includes protein, vitamin A, vitamin C, and other nutrient-rich foods to help the wound heal. ? Foods rich in protein include meat, dairy, beans, nuts, and other sources. ? Foods rich in vitamin A include carrots and dark green, leafy vegetables. ? Foods rich in vitamin C include citrus, tomatoes, and other fruits and vegetables. ? Nutrient-rich foods have protein, carbohydrates, fat, vitamins, or minerals. Eat a variety of healthy foods including vegetables, fruits, and whole grains. Contact a health care provider if:  You received a tetanus shot and you have swelling, severe pain, redness, or bleeding at the injection site.  Your pain is not controlled with  medicine.  You have redness, swelling, or pain around the wound.  You have fluid or blood coming from the wound.  Your wound feels warm to the touch.  You have pus or a bad smell coming from the wound.  You have a fever or chills.  You are nauseous or you vomit.  You are dizzy. Get help right away if:  You have a red streak going away from your wound.  The edges of the wound open up and separate.  Your wound is bleeding, and the bleeding does not stop with gentle  pressure.  You have a rash.  You faint.  You have trouble breathing. Summary  Always wash your hands with soap and water before changing your bandage (dressing).  To help with healing, eat foods that are rich in protein, vitamin A, vitamin C, and other nutrients.  Check your wound every day for signs of infection. Contact your health care provider if you suspect that your wound is infected. This information is not intended to replace advice given to you by your health care provider. Make sure you discuss any questions you have with your health care provider. Document Revised: 06/19/2018 Document Reviewed: 09/16/2015 Elsevier Patient Education  [place].

## 2019-11-06 ENCOUNTER — Encounter: Payer: Self-pay | Admitting: Surgery

## 2019-11-06 ENCOUNTER — Ambulatory Visit: Payer: Medicare Other | Admitting: Physician Assistant

## 2019-11-06 ENCOUNTER — Ambulatory Visit (INDEPENDENT_AMBULATORY_CARE_PROVIDER_SITE_OTHER): Payer: Medicare Other | Admitting: Surgery

## 2019-11-06 ENCOUNTER — Other Ambulatory Visit: Payer: Self-pay

## 2019-11-06 VITALS — BP 129/61 | HR 98 | Temp 98.3°F | Resp 16 | Ht 72.0 in | Wt 330.0 lb

## 2019-11-06 DIAGNOSIS — S81812D Laceration without foreign body, left lower leg, subsequent encounter: Secondary | ICD-10-CM

## 2019-11-06 NOTE — Progress Notes (Addendum)
This gentleman returns today for follow-up of his left pretibial/calf wound.  As usual, he has sporadic at applying his Silvadene dressings, he avoids utilizing his Ace wrap due to some tingling sensations he has in his feet.  However he has these even when he is not utilizing his Ace wrap.  He presents today for follow-up wound care. Today his wound appears to be continuing to progress, diminishing in size with excellent granulation, and ingrowth of epidermis around the perimeter. It measures 60 mm x 7 mm at its widest point, by 2 to 3 mm in depth.  Is 99% granulated with a couple small pits in the inferior aspect.  With application of topical lidocaine jelly for 5 minutes, we proceeded with excisional debridement utilizing a bone curette.  We excised fibrinous exudate some debris to the level of the subcutaneous tissues.  Punctate bleeding tissue remained throughout the wound.  This was easily controlled with pressure.  A new Silvadene dressing was applied.  He tolerated this well and there is clearly evidence of progress.  We will have him resume his dressing changes as best he is able and have him follow-up in 2 weeks.

## 2019-11-06 NOTE — Patient Instructions (Signed)
Continue daily dressing changes with Silvadene. Call in any increased redness, heat to touch, or increased pain.  Follow up in 2 weeks.

## 2019-11-15 ENCOUNTER — Encounter: Payer: Self-pay | Admitting: Surgery

## 2019-11-15 ENCOUNTER — Other Ambulatory Visit: Payer: Self-pay

## 2019-11-15 ENCOUNTER — Ambulatory Visit (INDEPENDENT_AMBULATORY_CARE_PROVIDER_SITE_OTHER): Payer: Medicare Other | Admitting: Surgery

## 2019-11-15 VITALS — BP 112/75 | HR 94 | Temp 98.9°F | Wt 331.0 lb

## 2019-11-15 DIAGNOSIS — S81812D Laceration without foreign body, left lower leg, subsequent encounter: Secondary | ICD-10-CM | POA: Diagnosis not present

## 2019-11-15 MED ORDER — SILVER SULFADIAZINE 1 % EX CREA: TOPICAL_CREAM | CUTANEOUS | 1 refills | Status: DC

## 2019-11-15 NOTE — Progress Notes (Signed)
Mr. Montefusco returns today for follow-up of his left pretibial/calf wound.  We have continued his Silvadene dressings.  He presents today for follow-up wound care. Today his wound appears to be continuing to progress, diminishing in size with excellent granulation, and ingrowth of epidermis around the perimeter. It measures 55 mm x 7 mm at its widest point, by 2 mm in depth.  Is 99% granulated with a couple small pits in the inferior aspect.  Despite the modest reduction in numbers he is making excellent progress, only 2 small foci of nongranulated pocket was debrided.  He tolerates the excisional debridement well.  With application of topical lidocaine jelly for 5 minutes, we proceeded with excisional debridement utilizing a bone curette.  We excised fibrinous exudate some debris to the level of the subcutaneous tissues.  Punctate bleeding tissue remained throughout the wound.  This was easily controlled with pressure.  A new Silvadene dressing was applied.  He tolerated this well and there is clearly evidence of progress.  We will have him resume his dressing changes as best he is able and have him follow-up in 2 weeks.

## 2019-11-15 NOTE — Patient Instructions (Signed)
Daily dressing changes with Silvadene cream.  Follow up in 2 weeks.

## 2019-11-16 ENCOUNTER — Ambulatory Visit (INDEPENDENT_AMBULATORY_CARE_PROVIDER_SITE_OTHER): Payer: Medicare Other | Admitting: Vascular Surgery

## 2019-11-16 ENCOUNTER — Ambulatory Visit (INDEPENDENT_AMBULATORY_CARE_PROVIDER_SITE_OTHER): Payer: Medicare Other

## 2019-11-16 VITALS — BP 142/79 | HR 86 | Resp 16 | Wt 331.4 lb

## 2019-11-16 DIAGNOSIS — E119 Type 2 diabetes mellitus without complications: Secondary | ICD-10-CM | POA: Diagnosis not present

## 2019-11-16 DIAGNOSIS — I739 Peripheral vascular disease, unspecified: Secondary | ICD-10-CM

## 2019-11-16 DIAGNOSIS — L819 Disorder of pigmentation, unspecified: Secondary | ICD-10-CM | POA: Diagnosis not present

## 2019-11-16 DIAGNOSIS — L989 Disorder of the skin and subcutaneous tissue, unspecified: Secondary | ICD-10-CM | POA: Diagnosis not present

## 2019-11-16 NOTE — Assessment & Plan Note (Signed)
blood glucose control important in reducing the progression of atherosclerotic disease. Also, involved in wound healing. On appropriate medications.  

## 2019-11-16 NOTE — Assessment & Plan Note (Signed)
ABIs today remain in the normal range at 1.0 on the right and 1.2 on the left.  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  Smoking cessation, weight loss, and control of sugars importance for reducing progression.  Plan to recheck in 1 year at this time.
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
## 2019-11-29 ENCOUNTER — Encounter: Payer: Self-pay | Admitting: Physician Assistant

## 2019-11-29 ENCOUNTER — Encounter: Payer: Self-pay | Admitting: Surgery

## 2019-11-29 ENCOUNTER — Other Ambulatory Visit: Payer: Self-pay

## 2019-11-29 ENCOUNTER — Ambulatory Visit (INDEPENDENT_AMBULATORY_CARE_PROVIDER_SITE_OTHER): Payer: Medicare Other | Admitting: Surgery

## 2019-11-29 VITALS — BP 130/83 | HR 89 | Temp 97.9°F | Ht 72.0 in | Wt 331.0 lb

## 2019-11-29 DIAGNOSIS — S81812D Laceration without foreign body, left lower leg, subsequent encounter: Secondary | ICD-10-CM | POA: Diagnosis not present

## 2019-11-29 NOTE — Progress Notes (Signed)
[facility] POST-OP OFFICE VISIT  11/29/2019  HPI: Ronald Malone is a 64 y.o. male s/p sequential excisional debridements of skin and subcutaneous tissues of the left medial calf wound.  He is following up with performing every other day of Silvadene application, he felt that letting it get some air was actually helping it and it appears that his wound is progressed to the point that it certainly has not hurt him.  He has been good at avoiding the development of crust and apparently that degree of application of the Silvadene has helped to keep any crust formation eliminated.  He has taken ownership of his care.  And is quite confident that he is progressing in his healing.  Vital signs: BP 130/83   Pulse 89   Temp 97.9 F (36.6 C) (Oral)   Ht 6' (1.829 m)   Wt (!) 331 lb (150.1 kg)   SpO2 94%   BMI 44.89 kg/m    Physical Exam: Constitutional: He appears well, at his baseline. Abdomen: Benign Skin: The left calf wound has the narrowest bit of granulation that remains, photo was taken and added to his chart.  At his request.  He has new skin growing on every better granulation that was noted previously.  For all intents and purposes we can describe this is healed.     Assessment/Plan: This is a 64 y.o. male s/p serial excisional debridements of his left calf wound.  He is nearly completely healed and will declare healed at this point.  Patient Active Problem List   Diagnosis Date Noted  . Laceration of calf without complication, left, subsequent encounter 09/18/2019  . Type 2 diabetes mellitus with hyperglycemia, without long-term current use of insulin ([employer]) 05/17/2019  . Morbid obesity ([place]) 05/17/2019  . Diabetes mellitus without complication ([place]) 05/27/9456  . Positive colorectal cancer screening using Cologuard test   . Bilateral leg numbness 10/16/2018  . History of MI (myocardial infarction) 10/05/2018  . Pulmonary emphysema ([place]) 10/05/2018  . H/O  heart artery stent 10/05/2018  . Peripheral artery disease ([place]) 09/26/2018  . DVT (deep venous thrombosis) ([place]) 09/19/2018  . History of tobacco abuse 09/19/2018  . Pain in limb 09/19/2018  . Acute pulmonary embolism ([place]) 09/01/2018  . Fatigue 08/29/2013  . MI (myocardial infarction) ([place]) 08/29/2013    -He knows what to do to continue the progress he is made.  I will be glad to see him should there be any future delay in the finality of his healing.  We will follow-up with him then as needed   [name] M.D., [facility] 11/29/2019, 9:21 AM

## 2019-11-29 NOTE — Patient Instructions (Addendum)
Dr.Rodenberg advised patient if wound has not healed within 2-3 weeks, to give our office a call for appointment. Patient advised to continue to apply Silvadene every other day to the wound. Follow-up with our office as needed. Please call and ask to speak with a nurse if you develop questions or concerns.  Wound Debridement, Care After This sheet gives you information about how to care for yourself after your procedure. Your health care provider may also give you more specific instructions. If you have problems or questions, contact your health care provider. What can I expect after the procedure? After the procedure, it is common to have:  Pain or soreness.  Fluid that leaks from the wound.  Stiffness.  A larger wound. This is because the dead tissue has been removed. Follow these instructions at home: Medicines  Take over-the-counter and prescription medicines only as told by your health care provider.  If you were prescribed an antibiotic medicine, take it or apply it as told by your health care provider. Do not stop using the antibiotic even if you start to feel better. Wound care  Follow instructions from your health care provider about how to take care of your wound. Make sure you: ? Wash your hands with soap and water before and after you change your bandage (dressing). If soap and water are not available, use hand sanitizer. ? Change your dressing as told by your health care provider. ? If your dressing is dry and stuck when you try to remove it, moisten or wet the dressing with saline or water so that it can be removed without harming your skin or wound tissue.  Check your wound for signs of infection every time your dressing is changed. Have someone help you do this if you are not able. Watch for: ? More redness, swelling, or pain. ? More fluid or blood. ? Warmth. ? Pus. ? A bad smell coming from your wound even after you clean it.  Do not take baths, swim, or use a  hot tub until your health care provider approves. Ask your health care provider if you may take showers. You may only be allowed to take sponge baths. Activity  Do not drive or use heavy machinery while taking prescription pain medicine.  Return to your normal activities as told by your health care provider. Ask your health care provider what activities are safe for you. General instructions      Eat a healthy diet with lots of protein such as meats, cheese, nuts and beans. Ask your health care provider to suggest the best diet for you.  Do not use any products that contain nicotine or tobacco, such as cigarettes, e-cigarettes, and chewing tobacco. These can delay wound healing after surgery. If you need help quitting, ask your health care provider.  Keep all follow-up visits as told by your health care provider. This is important. Contact a health care provider if:  You have a fever.  Your pain medicine is not helping.  Your wound is more red and swollen.  You have increased bleeding.  You have pus coming from your wound.  You have a bad smell coming from your wound.  Your wound is not getting better after 1-2 weeks of treatment.  You develop a new medical condition, such as diabetes, peripheral vascular disease, or conditions that affect your defense system (immune system). Summary  After the procedure, it is common to have pain, soreness, stiffness, or fluid leaking from the wound.  Follow  instructions from your health care provider about how to take care of your wound.  Check your wound for signs of infection every time your dressing is changed.  Eat a healthy diet with lots of protein. Ask your health care provider to suggest the best diet for you.  Contact a health care provider if you have fever, more swelling and redness, increased bleeding, or a bad smell from the wound. This information is not intended to replace advice given to you by your health care provider.  Make sure you discuss any questions you have with your health care provider. Document Revised: 02/21/2018 Document Reviewed: 02/21/2018 Elsevier Patient Education  [place].

## 2019-12-03 ENCOUNTER — Telehealth: Payer: Self-pay | Admitting: Physician Assistant

## 2019-12-03 NOTE — Telephone Encounter (Signed)
Copied from [place] [id number]. Topic: Medicare AWV >> Dec 03, 2019  1:24 PM Cher Nakai R wrote: Reason for CRM:  Left message for patient to call back and schedule Medicare Annual Wellness Visit (AWV) either virtually or in office.  No hx of AWV due 05/14/2019 AWV-I  Please schedule at anytime with [facility] Health Advisor.

## 2019-12-04 ENCOUNTER — Encounter: Payer: Self-pay | Admitting: Physician Assistant

## 2020-01-02 ENCOUNTER — Encounter: Payer: Self-pay | Admitting: Physician Assistant

## 2020-01-21 DIAGNOSIS — H40003 Preglaucoma, unspecified, bilateral: Secondary | ICD-10-CM | POA: Diagnosis not present

## 2020-01-21 LAB — HM DIABETES EYE EXAM

## 2020-01-23 ENCOUNTER — Other Ambulatory Visit: Payer: Self-pay

## 2020-01-23 ENCOUNTER — Ambulatory Visit (INDEPENDENT_AMBULATORY_CARE_PROVIDER_SITE_OTHER): Payer: Medicare Other | Admitting: Physician Assistant

## 2020-01-23 VITALS — Temp 98.0°F

## 2020-01-23 DIAGNOSIS — Z23 Encounter for immunization: Secondary | ICD-10-CM | POA: Diagnosis not present

## 2020-01-29 NOTE — Progress Notes (Signed)
Patient here for vaccination only.  I did not examine the patient.  I did review his medical history, medications, and allergies and vaccine consent form.  CMA gave vaccination. Patient tolerated well.  Paulene Floor, [facility] 01/29/2020 1:11 PM

## 2020-02-05 DIAGNOSIS — H40003 Preglaucoma, unspecified, bilateral: Secondary | ICD-10-CM | POA: Diagnosis not present

## 2020-02-11 ENCOUNTER — Encounter: Payer: Self-pay | Admitting: Physician Assistant

## 2020-02-18 NOTE — Progress Notes (Signed)
Complete physical exam   Patient: Ronald Malone   DOB: Jul 30, 1955   64 y.o. Male  MRN: [id number] Visit Date: 02/19/2020  Today's healthcare provider: [name], PA-C   Chief Complaint  Patient presents with  . Annual Exam  . Diabetes   Subjective    Ronald Malone is a 64 y.o. male who presents today for a complete physical exam.  He reports consuming a general diet. The patient does not participate in regular exercise at present. He generally feels well. He reports sleeping poorly. He does have additional problems to discuss today.  HPI  Diabetes Mellitus Type II, Follow-up  Lab Results  Component Value Date   HGBA1C 6.7 (A) 08/17/2019   HGBA1C 7.6 (A) 05/17/2019   HGBA1C 7.5 (A) 02/14/2019   Wt Readings from Last 3 Encounters:  02/19/20 (!) 335 lb 3.2 oz (152 kg)  11/29/19 (!) 331 lb (150.1 kg)  11/16/19 (!) 331 lb 6.4 oz (150.3 kg)   Last seen for diabetes 6 months ago.  Management since then includes continue current medication. He reports good compliance with treatment. He is not having side effects.  Symptoms: No fatigue No foot ulcerations  No appetite changes No nausea  Yes paresthesia of the feet  No polydipsia  No polyuria No visual disturbances   No vomiting     Home blood sugar records: not being checked  Episodes of hypoglycemia? No    Current insulin regiment: none Most Recent Eye Exam: UTD Current exercise: no regular exercise Current diet habits: well balanced  Pertinent Labs: Lab Results  Component Value Date   CHOL 145 10/18/2018   HDL 43 10/18/2018   LDLCALC 46 10/18/2018   LDLDIRECT 71 10/18/2018   TRIG 280 (H) 10/18/2018   CHOLHDL 3.4 10/18/2018   Lab Results  Component Value Date   NA 139 09/06/2019   K 4.5 09/06/2019   CREATININE 1.29 (H) 09/06/2019   GFRNONAA 57 (L) 09/06/2019   GFRAA >60 09/06/2019   GLUCOSE 150 (H) 09/06/2019      --------------------------------------------------------------------------------------------------- Lipid/Cholesterol, Follow-up  Last lipid panel Other pertinent labs  Lab Results  Component Value Date   CHOL 145 10/18/2018   HDL 43 10/18/2018   LDLCALC 46 10/18/2018   LDLDIRECT 71 10/18/2018   TRIG 280 (H) 10/18/2018   CHOLHDL 3.4 10/18/2018   Lab Results  Component Value Date   ALT 38 09/06/2019   AST 36 09/06/2019   PLT 312 09/06/2019     He was last seen for this 4 months ago.  Management since that visit includes continue statin.  He reports excellent compliance with treatment. He is not having side effects.   Symptoms: No chest pain No chest pressure/discomfort  No dyspnea Yes lower extremity edema  Yes numbness or tingling of extremity No orthopnea  No palpitations No paroxysmal nocturnal dyspnea  No speech difficulty No syncope   Current diet: in general, an "unhealthy" diet Current exercise: none  The ASCVD Risk score ([place] et al., 2013) failed to calculate for the following reasons:   The patient has a prior MI or stroke diagnosis  Obstructive sleep apnea: Patient reports he had a sleep study with [facility] cardiology on 11/27/2016. Event is listed in care everywhere but report is unavailable. Patient reports he should be using a CPAP but he is not.  ---------------------------------------------------------------------------------------------------  BP Readings from Last 6 Encounters:  02/19/20 (!) 160/76  11/29/19 130/83  11/16/19 (!) 142/79  11/15/19  112/75  11/06/19 129/61  10/25/19 137/90   Patient reports chronic lesions on his legs. Recent vascular surgery visit 11/2019 showed ABIs at 1 and 1.2. Previous angiogram showed slight atherosclerosis but no intervention necessary. Patient reports a red rash on his lower left extremity.   Past Medical History:  Diagnosis Date  . CHF (congestive heart failure) ([place])   . Clotting disorder ([place])    . MI (myocardial infarction) ([place])   . Pulmonary embolism [facility])    Past Surgical History:  Procedure Laterality Date  . CARDIAC CATHETERIZATION    . COLON SURGERY     1/3 colon removed as an infant  . COLONOSCOPY WITH PROPOFOL N/A 12/26/2018   Procedure: COLONOSCOPY WITH PROPOFOL;  Surgeon: Lucilla Lame, MD;  Location: [facility] ENDOSCOPY;  Service: Endoscopy;  Laterality: N/A;  . LOWER EXTREMITY ANGIOGRAPHY Right 10/23/2018   Procedure: LOWER EXTREMITY ANGIOGRAPHY;  Surgeon: Algernon Huxley, MD;  Location: [facility] CV LAB;  Service: Cardiovascular;  Laterality: Right;   Social History   Socioeconomic History  . Marital status: Married    Spouse name: [name]   . Number of children: 2  . Years of education: Not on file  . Highest education level: Not on file  Occupational History  . Occupation: RETIRED   Tobacco Use  . Smoking status: Current Every Day Smoker    Packs/day: 2.00    Years: 45.00    Pack years: 90.00    Types: Cigarettes    Last attempt to quit: 09/15/2018    Years since quitting: 1.4  . Smokeless tobacco: Never Used  Vaping Use  . Vaping Use: Never used  Substance and Sexual Activity  . Alcohol use: Not Currently    Comment: denies ETOH today 09/06/19  . Drug use: Not Currently    Types: Psilocybin  . Sexual activity: Not on file  Other Topics Concern  . Not on file  Social History Narrative  . Not on file   Social Determinants of Health   Financial Resource Strain:   . Difficulty of Paying Living Expenses: Not on file  Food Insecurity:   . Worried About Charity fundraiser in the Last Year: Not on file  . Ran Out of Food in the Last Year: Not on file  Transportation Needs:   . Lack of Transportation (Medical): Not on file  . Lack of Transportation (Non-Medical): Not on file  Physical Activity:   . Days of Exercise per Week: Not on file  . Minutes of Exercise per Session: Not on file  Stress:   . Feeling of Stress : Not on file  Social Connections:    . Frequency of Communication with Friends and Family: Not on file  . Frequency of Social Gatherings with Friends and Family: Not on file  . Attends Religious Services: Not on file  . Active Member of Clubs or Organizations: Not on file  . Attends Archivist Meetings: Not on file  . Marital Status: Not on file  Intimate Partner Violence:   . Fear of Current or Ex-Partner: Not on file  . Emotionally Abused: Not on file  . Physically Abused: Not on file  . Sexually Abused: Not on file   Family Status  Relation Name Status  . Mother  (Not Specified)  . Father  (Not Specified)   Family History  Problem Relation Age of Onset  . Parkinson's disease Mother   . Dementia Father    No Known Allergies  Patient Care  Team: Paulene Floor as PCP - General (Physician Assistant) Cathi Roan, [facility] (Pharmacist) Earlie Server, MD as Consulting Physician (Oncology)   Medications: Outpatient Medications Prior to Visit  Medication Sig  . ACCU-CHEK GUIDE test strip USE ONCE DAILY IN MORNING (E11.9)  . apixaban (ELIQUIS) 2.5 MG TABS tablet Take 1 tablet (2.5 mg total) by mouth 2 (two) times daily.  Marland Kitchen atorvastatin (LIPITOR) 20 MG tablet Take 20 mg by mouth daily.  . blood glucose meter kit and supplies Dispense based on patient and insurance preference. Use once daily in the morning. (FOR ICD-10 E11.9).  Marland Kitchen gabapentin (NEURONTIN) 300 MG capsule Take 1 capsule (300 mg total) by mouth at bedtime.  . metFORMIN (GLUCOPHAGE XR) 500 MG 24 hr tablet Take 2 tablets (1,000 mg total) by mouth 2 (two) times daily.  . nitroGLYCERIN (NITROSTAT) 0.4 MG SL tablet PLACE 1 TABLET (0.4 MG TOTAL) UNDER THE TONGUE EVERY 5 (FIVE) MINUTES AS NEEDED FOR CHEST PAIN. (Patient not taking: Reported on 12/26/2018)  . [DISCONTINUED] silver sulfADIAZINE (SILVADENE) 1 % cream Apply to affected area daily   No facility-administered medications prior to visit.    Review of Systems     Objective    BP (!)  160/76 (BP Location: Left Arm, Patient Position: Sitting, Cuff Size: Large)   Pulse 88   Temp 98 F (36.7 C) (Oral)   Ht 6' (1.829 m)   Wt (!) 335 lb 3.2 oz (152 kg)   SpO2 96%   BMI 45.46 kg/m     Physical Exam Constitutional:      Appearance: Normal appearance.  Cardiovascular:     Rate and Rhythm: Normal rate and regular rhythm.     Heart sounds: Normal heart sounds.  Pulmonary:     Effort: Pulmonary effort is normal.     Breath sounds: Normal breath sounds.  Skin:    General: Skin is warm and dry.  Neurological:     Mental Status: He is alert and oriented to person, place, and time. Mental status is at baseline.  Psychiatric:        Mood and Affect: Mood normal.        Behavior: Behavior normal.       Last depression screening scores PHQ 2/9 Scores 10/18/2018 10/05/2018  PHQ - 2 Score 0 0  PHQ- 9 Score 1 1   Last fall risk screening Fall Risk  11/29/2019  Falls in the past year? 0  Number falls in past yr: 0  Injury with Fall? 0   Last Audit-C alcohol use screening Alcohol Use Disorder Test (AUDIT) 10/18/2018  1. How often do you have a drink containing alcohol? 1  2. How many drinks containing alcohol do you have on a typical day when you are drinking? 0  3. How often do you have six or more drinks on one occasion? 1  AUDIT-C Score 2  Alcohol Brief Interventions/Follow-up -   A score of 3 or more in women, and 4 or more in men indicates increased risk for alcohol abuse, EXCEPT if all of the points are from question 1   No results found for any visits on 02/19/20.  Assessment & Plan    Routine Health Maintenance and Physical Exam  Exercise Activities and Dietary recommendations Goals    .  Exercise for Diabetes (pt-stated)      Current Barriers:  Marland Kitchen Knowledge Deficits related to basic Diabetes pathophysiology and self care/management   Case Manager Clinical Goal(s):  Over  the next 60 days, patient will demonstrate improved adherence to prescribed  treatment plan for diabetes self care/management as evidenced by:  . daily monitoring and recording of CBG  . adherence to ADA/ carb modified diet . exercise 3 days/week . adherence to prescribed medication regimen  Interventions:  . Provided education to patient about basic DM disease process . Provided patient with written educational materials related to hypo and hyperglycemia and importance of correct treatment . Counseling on ADA diet . Silver Sneakers/gym benefits of [facility] AARP plan  Patient Self Care Activities:  . Checks blood sugars as prescribed and utilize hyper and hypoglycemia protocol as needed . Adheres to prescribed ADA/carb modified diet  Initial goal documentation        Immunization History  Administered Date(s) Administered  . Fluad Quad(high Dose 65+) 01/02/2019, 01/23/2020  . Moderna SARS-COVID-2 Vaccination 03/28/2019, 04/25/2019  . Pneumococcal Conjugate-13 10/18/2018  . Tdap 09/18/2019    Health Maintenance  Topic Date Due  . PNA vac Low Risk Adult (2 of 2 - PPSV23) 10/18/2019  . FOOT EXAM  11/14/2019  . URINE MICROALBUMIN  11/14/2019  . HEMOGLOBIN A1C  02/16/2020  . OPHTHALMOLOGY EXAM  01/20/2021  . COLONOSCOPY  12/25/2028  . TETANUS/TDAP  09/17/2029  . INFLUENZA VACCINE  Completed  . COVID-19 Vaccine  Completed  . Hepatitis C Screening  Completed    Discussed health benefits of physical activity, and encouraged him to engage in regular exercise appropriate for his age and condition.  1. Annual physical exam  - TSH - Lipid panel - Comprehensive metabolic panel - CBC with Differential/Platelet  2. Type 2 diabetes mellitus with hyperglycemia, without long-term current use of insulin (HCC)  Controlled, continue current medications.   - POCT glycosylated hemoglobin (Hb A1C) - Urine Microalbumin w/creat. ratio  3. Morbid obesity ([place])  Discussed importance of healthy weight management Discussed diet and exercise   4. H/O heart  artery stent  Followed by cardiology.   5. Hyperlipidemia associated with type 2 diabetes mellitus ([place])  Continue statin.   6. Need for pneumococcal vaccination  Just got shingles vaccine, will get this next time.   7. Tobacco abuse  Not interested in quitting. Declines ct lung screening.  8. Other diabetic neurological complication associated with type 2 diabetes mellitus ([place])  He reports he may stop taking gabapentin 300 mg QHS.  9. Diabetes mellitus without complication (HCC)   - metFORMIN (GLUCOPHAGE XR) 500 MG 24 hr tablet; Take 2 tablets (1,000 mg total) by mouth 2 (two) times daily.  Dispense: 360 tablet; Refill: 1  10. Obstructive sleep apnea  Will request sleep study from [facility]. He is currently not using a CPAP.  11. Venous stasis dermatitis of both lower extremities  His most recent ABIs show 1 and 1.2. The area is not infected. This represents venous stasis dermatitis and I have counseled patient this is a chronic rash. Patient asks many questions about what he can do. He asks will it help to put lotion and I explained to him his skin would be less dry but the rash would not improve. He asks about neosporin and I advise him this would have no effect. He asks about seeing a dermatologist and I have explained I do not think they will do much for him but I have placed the referral regardless.   12. Skin lesions  - Ambulatory referral to Dermatology  13. Pulmonary Emphysema  Not interested in quitting smoking.   14. DVT  Will assume  eliquis 2.5 mg BID maintenance dose that he will be on indefinitely from oncology.  No follow-ups on file.     ITrinna Post, PA-C, have reviewed all documentation for this visit. The documentation on 02/19/20 for the exam, diagnosis, procedures, and orders are all accurate and complete.  The entirety of the information documented in the History of Present Illness, Review of Systems and Physical Exam were personally  obtained by me. Portions of this information were initially documented by [facility] and reviewed by me for thoroughness and accuracy.     Paulene Floor  [facility] [phone] (phone) [phone] (fax)  [place]

## 2020-02-19 ENCOUNTER — Ambulatory Visit (INDEPENDENT_AMBULATORY_CARE_PROVIDER_SITE_OTHER): Payer: Medicare Other | Admitting: Physician Assistant

## 2020-02-19 ENCOUNTER — Inpatient Hospital Stay (HOSPITAL_BASED_OUTPATIENT_CLINIC_OR_DEPARTMENT_OTHER): Payer: Medicare Other | Admitting: Oncology

## 2020-02-19 ENCOUNTER — Encounter: Payer: Self-pay | Admitting: Physician Assistant

## 2020-02-19 ENCOUNTER — Encounter: Payer: Self-pay | Admitting: Oncology

## 2020-02-19 ENCOUNTER — Inpatient Hospital Stay: Payer: Medicare Other | Attending: Oncology

## 2020-02-19 ENCOUNTER — Other Ambulatory Visit: Payer: Self-pay

## 2020-02-19 VITALS — BP 158/83 | HR 73 | Temp 98.0°F | Resp 18 | Wt 334.8 lb

## 2020-02-19 VITALS — BP 160/76 | HR 88 | Temp 98.0°F | Ht 72.0 in | Wt 335.2 lb

## 2020-02-19 DIAGNOSIS — E1149 Type 2 diabetes mellitus with other diabetic neurological complication: Secondary | ICD-10-CM

## 2020-02-19 DIAGNOSIS — J439 Emphysema, unspecified: Secondary | ICD-10-CM | POA: Insufficient documentation

## 2020-02-19 DIAGNOSIS — Z86718 Personal history of other venous thrombosis and embolism: Secondary | ICD-10-CM | POA: Diagnosis not present

## 2020-02-19 DIAGNOSIS — E1165 Type 2 diabetes mellitus with hyperglycemia: Secondary | ICD-10-CM | POA: Diagnosis not present

## 2020-02-19 DIAGNOSIS — F17210 Nicotine dependence, cigarettes, uncomplicated: Secondary | ICD-10-CM | POA: Insufficient documentation

## 2020-02-19 DIAGNOSIS — L989 Disorder of the skin and subcutaneous tissue, unspecified: Secondary | ICD-10-CM

## 2020-02-19 DIAGNOSIS — I878 Other specified disorders of veins: Secondary | ICD-10-CM | POA: Diagnosis not present

## 2020-02-19 DIAGNOSIS — E1169 Type 2 diabetes mellitus with other specified complication: Secondary | ICD-10-CM | POA: Insufficient documentation

## 2020-02-19 DIAGNOSIS — G4733 Obstructive sleep apnea (adult) (pediatric): Secondary | ICD-10-CM | POA: Insufficient documentation

## 2020-02-19 DIAGNOSIS — Z79899 Other long term (current) drug therapy: Secondary | ICD-10-CM | POA: Insufficient documentation

## 2020-02-19 DIAGNOSIS — Z86711 Personal history of pulmonary embolism: Secondary | ICD-10-CM

## 2020-02-19 DIAGNOSIS — Z7984 Long term (current) use of oral hypoglycemic drugs: Secondary | ICD-10-CM | POA: Insufficient documentation

## 2020-02-19 DIAGNOSIS — E785 Hyperlipidemia, unspecified: Secondary | ICD-10-CM

## 2020-02-19 DIAGNOSIS — E1140 Type 2 diabetes mellitus with diabetic neuropathy, unspecified: Secondary | ICD-10-CM | POA: Diagnosis not present

## 2020-02-19 DIAGNOSIS — I252 Old myocardial infarction: Secondary | ICD-10-CM | POA: Diagnosis not present

## 2020-02-19 DIAGNOSIS — I2510 Atherosclerotic heart disease of native coronary artery without angina pectoris: Secondary | ICD-10-CM | POA: Insufficient documentation

## 2020-02-19 DIAGNOSIS — Z7901 Long term (current) use of anticoagulants: Secondary | ICD-10-CM | POA: Insufficient documentation

## 2020-02-19 DIAGNOSIS — D689 Coagulation defect, unspecified: Secondary | ICD-10-CM | POA: Insufficient documentation

## 2020-02-19 DIAGNOSIS — Z23 Encounter for immunization: Secondary | ICD-10-CM

## 2020-02-19 DIAGNOSIS — Z0000 Encounter for general adult medical examination without abnormal findings: Secondary | ICD-10-CM

## 2020-02-19 DIAGNOSIS — E119 Type 2 diabetes mellitus without complications: Secondary | ICD-10-CM

## 2020-02-19 DIAGNOSIS — Z720 Tobacco use: Secondary | ICD-10-CM

## 2020-02-19 DIAGNOSIS — Z955 Presence of coronary angioplasty implant and graft: Secondary | ICD-10-CM

## 2020-02-19 DIAGNOSIS — K760 Fatty (change of) liver, not elsewhere classified: Secondary | ICD-10-CM | POA: Insufficient documentation

## 2020-02-19 DIAGNOSIS — I82431 Acute embolism and thrombosis of right popliteal vein: Secondary | ICD-10-CM

## 2020-02-19 DIAGNOSIS — E1151 Type 2 diabetes mellitus with diabetic peripheral angiopathy without gangrene: Secondary | ICD-10-CM | POA: Diagnosis not present

## 2020-02-19 DIAGNOSIS — I872 Venous insufficiency (chronic) (peripheral): Secondary | ICD-10-CM

## 2020-02-19 LAB — POCT GLYCOSYLATED HEMOGLOBIN (HGB A1C)
Est. average glucose Bld gHb Est-mCnc: 151
Hemoglobin A1C: 6.9 % — AB (ref 4.0–5.6)

## 2020-02-19 LAB — CBC WITH DIFFERENTIAL/PLATELET
Abs Immature Granulocytes: 0.04 10*3/uL (ref 0.00–0.07)
Basophils Absolute: 0 10*3/uL (ref 0.0–0.1)
Basophils Relative: 1 %
Eosinophils Absolute: 0.3 10*3/uL (ref 0.0–0.5)
Eosinophils Relative: 3 %
HCT: 42 % (ref 39.0–52.0)
Hemoglobin: 13.8 g/dL (ref 13.0–17.0)
Immature Granulocytes: 1 %
Lymphocytes Relative: 27 %
Lymphs Abs: 2.1 10*3/uL (ref 0.7–4.0)
MCH: 30.7 pg (ref 26.0–34.0)
MCHC: 32.9 g/dL (ref 30.0–36.0)
MCV: 93.5 fL (ref 80.0–100.0)
Monocytes Absolute: 0.7 10*3/uL (ref 0.1–1.0)
Monocytes Relative: 8 %
Neutro Abs: 4.7 10*3/uL (ref 1.7–7.7)
Neutrophils Relative %: 60 %
Platelets: 242 10*3/uL (ref 150–400)
RBC: 4.49 MIL/uL (ref 4.22–5.81)
RDW: 13.2 % (ref 11.5–15.5)
WBC: 7.9 10*3/uL (ref 4.0–10.5)
nRBC: 0 % (ref 0.0–0.2)

## 2020-02-19 LAB — COMPREHENSIVE METABOLIC PANEL
ALT: 32 U/L (ref 0–44)
AST: 21 U/L (ref 15–41)
Albumin: 3.8 g/dL (ref 3.5–5.0)
Alkaline Phosphatase: 64 U/L (ref 38–126)
Anion gap: 11 (ref 5–15)
BUN: 16 mg/dL (ref 8–23)
CO2: 25 mmol/L (ref 22–32)
Calcium: 8.9 mg/dL (ref 8.9–10.3)
Chloride: 101 mmol/L (ref 98–111)
Creatinine, Ser: 0.97 mg/dL (ref 0.61–1.24)
GFR, Estimated: >60 mL/min (ref >60)
Glucose, Bld: 148 mg/dL — ABNORMAL HIGH (ref 70–99)
Potassium: 4.1 mmol/L (ref 3.5–5.1)
Sodium: 137 mmol/L (ref 135–145)
Total Bilirubin: 0.6 mg/dL (ref 0.3–1.2)
Total Protein: 7.4 g/dL (ref 6.5–8.1)

## 2020-02-19 MED ORDER — METFORMIN HCL ER 500 MG PO TB24: 1000.0000 mg | ORAL_TABLET | Freq: Two times a day (BID) | ORAL | 1 refills | Status: DC

## 2020-02-19 NOTE — Progress Notes (Signed)
Patient here for follow up. Pt reports having puplish color to lower legs, edema and pain. He is unsure if it is related to his circulation.

## 2020-02-19 NOTE — Patient Instructions (Signed)

## 2020-02-19 NOTE — Progress Notes (Signed)
Hematology/Oncology follow up note [facility] [phone] [phone]   Patient Care Team: Paulene Floor as PCP - General (Physician Assistant) Cathi Roan, [facility] (Pharmacist) Earlie Server, MD as Consulting Physician (Oncology)  REFERRING PROVIDER: [name], PA-C  CHIEF COMPLAINTS/REASON FOR VISIT:  Follow-up for pulmonary embolism.   HISTORY OF PRESENTING ILLNESS:  Ronald Malone is a 64 y.o. male who was seen in consultation at the request of Pollak, Adriana M, PA-C for evaluation of pulmonary embolism and DVT. Patient was diagnosed with acute pulmonary embolism on 09/01/2018. Patient's presenting symptoms included left sided upper chest wall pain, pruritic, cough, SOB.   09/01/2018 CT angio chest showed non conclusive pulmonary embolus at the bifurcation of the right middle and lower lobe pulmonary arteries. Punctate embolus with a lingular branch.  Extensive coronary artery disease, emphysema, hepatic steatosis.  09/01/2018 US venous bilateral lower extremities showed non occlusive DVT popliteal vein.  2D echo showed LVEF 60%, no strain pattern.  Patient was started on therapeutic lovenox and switched to Eliquis.  Hypercoagulable work-up showed negative factor V Leiden mutation and prothrombin gene mutation. Age appropriate cancer screening:   Smokes 2 packs a day never had colonoscopy done.  Patient also has a peripheral polyneuropathy.  From diabetes. Patient follows up with vascular surgery for chronic hip and buttock claudication as well as neuropathy symptoms in the feet and ankles.  Patient underwent angiography for further evaluation on 10/23/2018.  Mired peripheral arterial disease.  INTERVAL HISTORY Ronald Malone is a 64 y.o. male who has above history reviewed by me today presents for follow up visit for management of history of DVT and PE Problems and complaints are listed below: Patient takes Eliquis 2.5  mg twice daily.  He reports tolerating well with no bleeding events. He continues to have bilateral lower extremity purple redness.  He follows up with vascular surgery for peripheral vascular disease.  Intermittent swelling which is improved after leg elevation and worsened with standing or walking.  Review of Systems  Constitutional: Positive for fatigue. Negative for appetite change, chills, fever and unexpected weight change.  HENT:   Negative for hearing loss and voice change.   Eyes: Negative for eye problems and icterus.  Respiratory: Negative for chest tightness, cough and shortness of breath.   Cardiovascular: Negative for chest pain and leg swelling.  Gastrointestinal: Negative for abdominal distention and abdominal pain.  Endocrine: Negative for hot flashes.  Genitourinary: Negative for difficulty urinating, dysuria and frequency.   Musculoskeletal: Positive for arthralgias.  Skin: Negative for itching and rash.       Right lower extremity patchy redness  Neurological: Positive for numbness. Negative for light-headedness.  Hematological: Negative for adenopathy. Does not bruise/bleed easily.  Psychiatric/Behavioral: Negative for confusion.    MEDICAL HISTORY:  Past Medical History:  Diagnosis Date  . CHF (congestive heart failure) ([place])   . Clotting disorder ([place])   . MI (myocardial infarction) ([place])   . Pulmonary embolism ([place])     SURGICAL HISTORY: Past Surgical History:  Procedure Laterality Date  . CARDIAC CATHETERIZATION    . COLON SURGERY     1/3 colon removed as an infant  . COLONOSCOPY WITH PROPOFOL N/A 12/26/2018   Procedure: COLONOSCOPY WITH PROPOFOL;  Surgeon: Lucilla Lame, MD;  Location: [facility] ENDOSCOPY;  Service: Endoscopy;  Laterality: N/A;  . LOWER EXTREMITY ANGIOGRAPHY Right 10/23/2018   Procedure: LOWER EXTREMITY ANGIOGRAPHY;  Surgeon: Algernon Huxley, MD;  Location: [facility] INVASIVE CV  LAB;  Service: Cardiovascular;  Laterality: Right;    SOCIAL  HISTORY: Social History   Socioeconomic History  . Marital status: Married    Spouse name: [name]   . Number of children: 2  . Years of education: Not on file  . Highest education level: Not on file  Occupational History  . Occupation: RETIRED   Tobacco Use  . Smoking status: Current Every Day Smoker    Packs/day: 2.00    Years: 45.00    Pack years: 90.00    Types: Cigarettes    Last attempt to quit: 09/15/2018    Years since quitting: 1.4  . Smokeless tobacco: Never Used  Vaping Use  . Vaping Use: Never used  Substance and Sexual Activity  . Alcohol use: Not Currently    Comment: denies ETOH today 09/06/19  . Drug use: Not Currently    Types: Psilocybin  . Sexual activity: Not on file  Other Topics Concern  . Not on file  Social History Narrative  . Not on file   Social Determinants of Health   Financial Resource Strain:   . Difficulty of Paying Living Expenses: Not on file  Food Insecurity:   . Worried About Charity fundraiser in the Last Year: Not on file  . Ran Out of Food in the Last Year: Not on file  Transportation Needs:   . Lack of Transportation (Medical): Not on file  . Lack of Transportation (Non-Medical): Not on file  Physical Activity:   . Days of Exercise per Week: Not on file  . Minutes of Exercise per Session: Not on file  Stress:   . Feeling of Stress : Not on file  Social Connections:   . Frequency of Communication with Friends and Family: Not on file  . Frequency of Social Gatherings with Friends and Family: Not on file  . Attends Religious Services: Not on file  . Active Member of Clubs or Organizations: Not on file  . Attends Archivist Meetings: Not on file  . Marital Status: Not on file  Intimate Partner Violence:   . Fear of Current or Ex-Partner: Not on file  . Emotionally Abused: Not on file  . Physically Abused: Not on file  . Sexually Abused: Not on file    FAMILY HISTORY: Family History  Problem Relation Age of Onset   . Parkinson's disease Mother   . Dementia Father     ALLERGIES:  has No Known Allergies.  MEDICATIONS:  Current Outpatient Medications  Medication Sig Dispense Refill  . ACCU-CHEK GUIDE test strip USE ONCE DAILY IN MORNING (E11.9) 100 strip 0  . apixaban (ELIQUIS) 2.5 MG TABS tablet Take 1 tablet (2.5 mg total) by mouth 2 (two) times daily. 180 tablet 1  . atorvastatin (LIPITOR) 20 MG tablet Take 20 mg by mouth daily.    . blood glucose meter kit and supplies Dispense based on patient and insurance preference. Use once daily in the morning. (FOR ICD-10 E11.9). 1 each 0  . gabapentin (NEURONTIN) 300 MG capsule Take 1 capsule (300 mg total) by mouth at bedtime. 90 capsule 1  . latanoprost (XALATAN) 0.005 % ophthalmic solution SMARTSIG:In Eye(s)    . metFORMIN (GLUCOPHAGE XR) 500 MG 24 hr tablet Take 2 tablets (1,000 mg total) by mouth 2 (two) times daily. 360 tablet 1  . nitroGLYCERIN (NITROSTAT) 0.4 MG SL tablet PLACE 1 TABLET (0.4 MG TOTAL) UNDER THE TONGUE EVERY 5 (FIVE) MINUTES AS NEEDED FOR CHEST PAIN. (Patient  not taking: Reported on 12/26/2018) 300 tablet 1   No current facility-administered medications for this visit.     PHYSICAL EXAMINATION: ECOG PERFORMANCE STATUS: 1 - Symptomatic but completely ambulatory Vitals:   02/19/20 1035  BP: (!) 158/83  Pulse: 73  Resp: 18  Temp: 98 F (36.7 C)   Filed Weights   02/19/20 1035  Weight: (!) 334 lb 12.8 oz (151.9 kg)    Physical Exam Constitutional:      General: He is not in acute distress.    Comments: Morbidly obese  HENT:     Head: Normocephalic and atraumatic.  Eyes:     General: No scleral icterus.    Pupils: Pupils are equal, round, and reactive to light.  Cardiovascular:     Rate and Rhythm: Normal rate and regular rhythm.     Heart sounds: Normal heart sounds.  Pulmonary:     Effort: Pulmonary effort is normal. No respiratory distress.     Breath sounds: No wheezing.  Abdominal:     General: Bowel sounds  are normal. There is no distension.     Palpations: Abdomen is soft. There is no mass.     Tenderness: There is no abdominal tenderness.  Musculoskeletal:        General: No deformity. Normal range of motion.     Cervical back: Normal range of motion and neck supple.     Comments: Trace right lower extremity edema.   Skin:    General: Skin is warm and dry.     Findings: No rash.     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     Cranial Nerves: No cranial nerve deficit.     Coordination: Coordination normal.  Psychiatric:        Mood and Affect: Mood normal.        Thought Content: Thought content normal.     RADIOGRAPHIC STUDIES: I have personally reviewed the radiological images as listed and agreed with the findings in the report. No results found.   LABORATORY DATA:  I have reviewed the data as listed Lab Results  Component Value Date   WBC 7.9 02/19/2020   HGB 13.8 02/19/2020   HCT 42.0 02/19/2020   MCV 93.5 02/19/2020   PLT 242 02/19/2020   Recent Labs    02/20/19 1136 02/20/19 1136 08/21/19 0956 09/06/19 1945 02/19/20 1004  NA 138   < > 139 139 137  K 3.9   < > 4.1 4.5 4.1  CL 103   < > 103 103 101  CO2 27   < > 26 20* 25  GLUCOSE 194*   < > 166* 150* 148*  BUN 24*   < > 18 22 16   CREATININE 0.93   < > 1.04 1.29* 0.97  CALCIUM 9.3   < > 8.8* 9.2 8.9  GFRNONAA >60   < > >60 57* >60  GFRAA >60  --  >60 >60  --   PROT 7.4   < > 7.6 7.4 7.4  ALBUMIN 3.9   < > 3.9 3.9 3.8  AST 20   < > 26 36 21  ALT 27   < > 39 38 32  ALKPHOS 84   < > 66 59 64  BILITOT 0.7   < > 0.7 0.6 0.6   < > = values in this interval not displayed.  Iron/TIBC/Ferritin/ %Sat No results found for: IRON, TIBC, FERRITIN, IRONPCTSAT     ASSESSMENT & PLAN:  1. History of pulmonary embolism   2. History of deep vein thrombosis (DVT) of lower extremity   3.  Venous stasis of lower extremity    #History of unprovoked pulmonary embolism and lower extremity DVT. Labs reviewed and discussed with patient.  Continue Eliquis 2.5 mg twice daily for long-term anticoagulation prophylaxis.  Bilateral lower extremity venous stasis, as well as peripheral artery disease. Recommend patient to continue follow-up with vascular surgeon. Recommend smoke cessation, weight loss.  I discussed with the patient about option of continue follow-up with me every 6 months for management of Eliquis or return to primary care provider to take over the responsibility of Eliquis prescribing and monitoring.  Patient would like no appointment to be scheduled at this point.  He is going to talk to primary care provider first.  He will call if he needs to schedule follow-up.  No orders of the defined types were placed in this encounter.   All questions were answered. The patient knows to call the clinic with any problems questions or concerns. Follow-up in 6 months.  Earlie Server, MD, PhD 02/19/2020

## 2020-02-20 ENCOUNTER — Encounter: Payer: Self-pay | Admitting: Family Medicine

## 2020-02-20 ENCOUNTER — Encounter: Payer: Self-pay | Admitting: Physician Assistant

## 2020-02-20 DIAGNOSIS — Z1211 Encounter for screening for malignant neoplasm of colon: Secondary | ICD-10-CM

## 2020-02-20 LAB — CBC WITH DIFFERENTIAL/PLATELET
Basophils Absolute: 0.1 10*3/uL (ref 0.0–0.2)
Basos: 1 %
EOS (ABSOLUTE): 0.3 10*3/uL (ref 0.0–0.4)
Eos: 4 %
Hematocrit: 41.4 % (ref 37.5–51.0)
Hemoglobin: 13.7 g/dL (ref 13.0–17.7)
Immature Grans (Abs): 0 10*3/uL (ref 0.0–0.1)
Immature Granulocytes: 0 %
Lymphocytes Absolute: 1.8 10*3/uL (ref 0.7–3.1)
Lymphs: 25 %
MCH: 29.8 pg (ref 26.6–33.0)
MCHC: 33.1 g/dL (ref 31.5–35.7)
MCV: 90 fL (ref 79–97)
Monocytes Absolute: 0.6 10*3/uL (ref 0.1–0.9)
Monocytes: 8 %
Neutrophils Absolute: 4.4 10*3/uL (ref 1.4–7.0)
Neutrophils: 62 %
Platelets: 253 10*3/uL (ref 150–450)
RBC: 4.59 x10E6/uL (ref 4.14–5.80)
RDW: 12.8 % (ref 11.6–15.4)
WBC: 7.1 10*3/uL (ref 3.4–10.8)

## 2020-02-20 LAB — COMPREHENSIVE METABOLIC PANEL
ALT: 29 IU/L (ref 0–44)
AST: 18 IU/L (ref 0–40)
Albumin/Globulin Ratio: 1.4 (ref 1.2–2.2)
Albumin: 4.1 g/dL (ref 3.8–4.8)
Alkaline Phosphatase: 83 IU/L (ref 44–121)
BUN/Creatinine Ratio: 16 (ref 10–24)
BUN: 15 mg/dL (ref 8–27)
Bilirubin Total: 0.3 mg/dL (ref 0.0–1.2)
CO2: 21 mmol/L (ref 20–29)
Calcium: 8.9 mg/dL (ref 8.6–10.2)
Chloride: 103 mmol/L (ref 96–106)
Creatinine, Ser: 0.96 mg/dL (ref 0.76–1.27)
GFR calc Af Amer: 95 mL/min/{1.73_m2} (ref >59)
GFR calc non Af Amer: 82 mL/min/{1.73_m2} (ref >59)
Globulin, Total: 2.9 g/dL (ref 1.5–4.5)
Glucose: 149 mg/dL — ABNORMAL HIGH (ref 65–99)
Potassium: 4.3 mmol/L (ref 3.5–5.2)
Sodium: 138 mmol/L (ref 134–144)
Total Protein: 7 g/dL (ref 6.0–8.5)

## 2020-02-20 LAB — LIPID PANEL
Chol/HDL Ratio: 3.8 ratio (ref 0.0–5.0)
Cholesterol, Total: 150 mg/dL (ref 100–199)
HDL: 39 mg/dL — ABNORMAL LOW (ref >39)
LDL Chol Calc (NIH): 76 mg/dL (ref 0–99)
Triglycerides: 207 mg/dL — ABNORMAL HIGH (ref 0–149)
VLDL Cholesterol Cal: 35 mg/dL (ref 5–40)

## 2020-02-20 LAB — MICROALBUMIN / CREATININE URINE RATIO
Creatinine, Urine: 131.7 mg/dL
Microalb/Creat Ratio: 22 mg/g creat (ref 0–29)
Microalbumin, Urine: 29.2 ug/mL

## 2020-02-20 LAB — TSH: TSH: 0.921 u[IU]/mL (ref 0.450–4.500)

## 2020-02-21 ENCOUNTER — Encounter: Payer: Self-pay | Admitting: Physician Assistant

## 2020-02-25 ENCOUNTER — Other Ambulatory Visit: Payer: Self-pay

## 2020-02-25 ENCOUNTER — Telehealth (INDEPENDENT_AMBULATORY_CARE_PROVIDER_SITE_OTHER): Payer: Self-pay | Admitting: Gastroenterology

## 2020-02-25 DIAGNOSIS — Z1211 Encounter for screening for malignant neoplasm of colon: Secondary | ICD-10-CM

## 2020-02-25 NOTE — Progress Notes (Signed)
Gastroenterology Pre-Procedure Review  Request Date: 04/01/20 Requesting Physician: Dr. Allen Norris  PATIENT REVIEW QUESTIONS: The patient responded to the following health history questions as indicated:    1. Are you having any GI issues? yes (constipation, previous colonoscopy referral positive occult poor prep 12/2018) 2. Do you have a personal history of Polyps? no 3. Do you have a family history of Colon Cancer or Polyps? no 4. 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 Joint replacements in the past 12 months?no 6. Major health problems in the past 3 months?no 7. Any artificial heart valves, MVP, or defibrillator?no    MEDICATIONS & ALLERGIES:    Patient reports the following regarding taking any anticoagulation/antiplatelet therapy:   Plavix, Coumadin, Eliquis, Xarelto, Lovenox, Pradaxa, Brilinta, or Effient? no Aspirin? no  Patient confirms/reports the following medications:  Current Outpatient Medications  Medication Sig Dispense Refill  . ACCU-CHEK GUIDE test strip USE ONCE DAILY IN MORNING (E11.9) 100 strip 0  . apixaban (ELIQUIS) 2.5 MG TABS tablet Take 1 tablet (2.5 mg total) by mouth 2 (two) times daily. 180 tablet 1  . atorvastatin (LIPITOR) 20 MG tablet Take 20 mg by mouth daily.    . blood glucose meter kit and supplies Dispense based on patient and insurance preference. Use once daily in the morning. (FOR ICD-10 E11.9). 1 each 0  . gabapentin (NEURONTIN) 300 MG capsule Take 1 capsule (300 mg total) by mouth at bedtime. 90 capsule 1  . latanoprost (XALATAN) 0.005 % ophthalmic solution SMARTSIG:In Eye(s)    . metFORMIN (GLUCOPHAGE XR) 500 MG 24 hr tablet Take 2 tablets (1,000 mg total) by mouth 2 (two) times daily. 360 tablet 1  . nitroGLYCERIN (NITROSTAT) 0.4 MG SL tablet PLACE 1 TABLET (0.4 MG TOTAL) UNDER THE TONGUE EVERY 5 (FIVE) MINUTES AS NEEDED FOR CHEST PAIN. (Patient not taking: Reported on 12/26/2018) 300 tablet 1   No current facility-administered medications for this  visit.    Patient confirms/reports the following allergies:  No Known Allergies  Orders Placed This Encounter  Procedures  . Procedural/ Surgical Case Request: COLONOSCOPY WITH PROPOFOL    Standing Status:   Standing    Number of Occurrences:   1    Order Specific Question:   Pre-op diagnosis    Answer:   screening colonoscopy    Order Specific Question:   CPT Code    Answer:   44315    AUTHORIZATION INFORMATION Primary Insurance: 1D#: Group #:  Secondary Insurance: 1D#: Group #:  SCHEDULE INFORMATION: Date: 04/01/20 Time: Location:[facility]

## 2020-02-26 ENCOUNTER — Telehealth: Payer: Medicare Other

## 2020-02-27 ENCOUNTER — Encounter: Payer: Self-pay | Admitting: Physician Assistant

## 2020-02-28 ENCOUNTER — Telehealth: Payer: Self-pay | Admitting: Physician Assistant

## 2020-02-28 NOTE — Telephone Encounter (Signed)
We might have to wait for sleep study to scan into media tab to send it over.

## 2020-03-03 ENCOUNTER — Telehealth: Payer: Self-pay

## 2020-03-03 DIAGNOSIS — I2510 Atherosclerotic heart disease of native coronary artery without angina pectoris: Secondary | ICD-10-CM | POA: Diagnosis not present

## 2020-03-03 DIAGNOSIS — R0602 Shortness of breath: Secondary | ICD-10-CM | POA: Diagnosis not present

## 2020-03-03 DIAGNOSIS — Z1211 Encounter for screening for malignant neoplasm of colon: Secondary | ICD-10-CM

## 2020-03-03 DIAGNOSIS — I824Z9 Acute embolism and thrombosis of unspecified deep veins of unspecified distal lower extremity: Secondary | ICD-10-CM | POA: Diagnosis not present

## 2020-03-03 DIAGNOSIS — I208 Other forms of angina pectoris: Secondary | ICD-10-CM | POA: Diagnosis not present

## 2020-03-03 NOTE — Telephone Encounter (Signed)
Patient has been advised to stop Eliquis 2 days prior to his colonoscopy per blood thinner request received from Creig Hines on 02/28/20. Restart 1 day after colonoscopy.  Thanks,  [place], [place]

## 2020-03-04 NOTE — Telephone Encounter (Signed)
Pt states [employer] sent over more paperwork on 03/03/20 to be signed.  When papers from that day were located there was not an order with it.  Huey Romans is going to fax over the order form.  Pressure settings will need to be included and then Fabio Bering will need to sign and fax back.

## 2020-03-12 NOTE — Telephone Encounter (Signed)
Order forms signed and placed for fax.

## 2020-03-17 ENCOUNTER — Encounter: Payer: Self-pay | Admitting: Oncology

## 2020-03-17 ENCOUNTER — Encounter: Payer: Self-pay | Admitting: Physician Assistant

## 2020-03-17 DIAGNOSIS — I82431 Acute embolism and thrombosis of right popliteal vein: Secondary | ICD-10-CM

## 2020-03-18 MED ORDER — APIXABAN 2.5 MG PO TABS: 2.5000 mg | ORAL_TABLET | Freq: Two times a day (BID) | ORAL | 1 refills | Status: DC

## 2020-03-24 ENCOUNTER — Ambulatory Visit (INDEPENDENT_AMBULATORY_CARE_PROVIDER_SITE_OTHER): Payer: Medicare Other | Admitting: Physician Assistant

## 2020-03-24 ENCOUNTER — Other Ambulatory Visit: Payer: Self-pay

## 2020-03-24 ENCOUNTER — Telehealth: Payer: Self-pay | Admitting: Gastroenterology

## 2020-03-24 DIAGNOSIS — Z23 Encounter for immunization: Secondary | ICD-10-CM | POA: Diagnosis not present

## 2020-03-24 NOTE — Telephone Encounter (Signed)
Patient came in and states that his prep is not at pharmacy for procedure on Apr 01, 2020.  Please call in prep and call pt to inform

## 2020-03-25 ENCOUNTER — Other Ambulatory Visit: Payer: Self-pay

## 2020-03-25 MED ORDER — SUPREP BOWEL PREP KIT 17.5-3.13-1.6 GM/177ML PO SOLN: 1.0000 | ORAL | 0 refills | Status: DC

## 2020-03-25 NOTE — Telephone Encounter (Signed)
Pt notified rx bowel prep has been sent to his pharmacy.

## 2020-03-25 NOTE — Progress Notes (Signed)
Patient here for Pneumovac vaccination only.  I did not examine the patient.  I did review his medical history, medications, and allergies and vaccine consent form.  CMA gave vaccination. Patient tolerated well.  Ronald Malone [facility] 03/25/2020 9:43 AM

## 2020-03-28 ENCOUNTER — Other Ambulatory Visit
Admission: RE | Admit: 2020-03-28 | Discharge: 2020-03-28 | Disposition: A | Discharge status: Home / Self Care | Payer: Medicare Other | Source: Ambulatory Visit | Attending: Gastroenterology | Admitting: Gastroenterology

## 2020-03-28 ENCOUNTER — Telehealth: Payer: Self-pay | Admitting: *Deleted

## 2020-03-28 ENCOUNTER — Other Ambulatory Visit: Payer: Self-pay

## 2020-03-28 DIAGNOSIS — Z01812 Encounter for preprocedural laboratory examination: Secondary | ICD-10-CM | POA: Diagnosis not present

## 2020-03-28 DIAGNOSIS — Z20822 Contact with and (suspected) exposure to covid-19: Secondary | ICD-10-CM | POA: Diagnosis not present

## 2020-03-28 NOTE — Chronic Care Management (AMB) (Signed)
  Chronic Care Management   Note  03/28/2020 Name: Ronald Malone MRN: [id number] DOB: [birth year]  Ronald Malone is a 65 y.o. year Malone male who is a primary care patient of [name], [place]. I reached out to [name] by phone today in response to a referral sent by [name] health plan.     [name] was given information about Chronic Care Management services today including:  1. CCM service includes personalized support from designated clinical staff supervised by his physician, including individualized plan of care and coordination with other care providers 2. 24/7 contact phone numbers for assistance for urgent and routine care needs. 3. Service will only be billed when office clinical staff spend 20 minutes or more in a month to coordinate care. 4. Only one practitioner may furnish and bill the service in a calendar month. 5. The patient may stop CCM services at any time (effective at the end of the month) by phone call to the office staff. 6. The patient will be responsible for cost sharing (co-pay) of up to 20% of the service fee (after annual deductible is met).  Patient agreed to services and verbal consent obtained.   Follow up plan: Telephone appointment with care management team member scheduled for:04/24/2020  [place] [facility]

## 2020-03-29 LAB — SARS CORONAVIRUS 2 (TAT 6-24 HRS): SARS Coronavirus 2: NEGATIVE

## 2020-03-31 ENCOUNTER — Encounter: Payer: Self-pay | Admitting: Gastroenterology

## 2020-04-01 ENCOUNTER — Other Ambulatory Visit: Payer: Self-pay

## 2020-04-01 ENCOUNTER — Encounter
Admission: RE | Disposition: A | Discharge status: Home / Self Care | Payer: Self-pay | Source: Home / Self Care | Attending: Gastroenterology

## 2020-04-01 ENCOUNTER — Ambulatory Visit
Admission: RE | Admit: 2020-04-01 | Discharge: 2020-04-01 | Disposition: A | Discharge status: Home / Self Care | Payer: Medicare Other | Attending: Gastroenterology | Admitting: Gastroenterology

## 2020-04-01 ENCOUNTER — Ambulatory Visit: Payer: Medicare Other | Admitting: Registered Nurse

## 2020-04-01 ENCOUNTER — Encounter: Payer: Self-pay | Admitting: Gastroenterology

## 2020-04-01 DIAGNOSIS — D126 Benign neoplasm of colon, unspecified: Secondary | ICD-10-CM | POA: Diagnosis not present

## 2020-04-01 DIAGNOSIS — I2510 Atherosclerotic heart disease of native coronary artery without angina pectoris: Secondary | ICD-10-CM | POA: Diagnosis not present

## 2020-04-01 DIAGNOSIS — I110 Hypertensive heart disease with heart failure: Secondary | ICD-10-CM | POA: Diagnosis not present

## 2020-04-01 DIAGNOSIS — D125 Benign neoplasm of sigmoid colon: Secondary | ICD-10-CM | POA: Insufficient documentation

## 2020-04-01 DIAGNOSIS — Z1211 Encounter for screening for malignant neoplasm of colon: Secondary | ICD-10-CM | POA: Diagnosis not present

## 2020-04-01 DIAGNOSIS — I252 Old myocardial infarction: Secondary | ICD-10-CM | POA: Diagnosis not present

## 2020-04-01 DIAGNOSIS — K635 Polyp of colon: Secondary | ICD-10-CM

## 2020-04-01 DIAGNOSIS — D123 Benign neoplasm of transverse colon: Secondary | ICD-10-CM | POA: Insufficient documentation

## 2020-04-01 DIAGNOSIS — Z7984 Long term (current) use of oral hypoglycemic drugs: Secondary | ICD-10-CM | POA: Diagnosis not present

## 2020-04-01 DIAGNOSIS — Z7901 Long term (current) use of anticoagulants: Secondary | ICD-10-CM | POA: Insufficient documentation

## 2020-04-01 DIAGNOSIS — Z79899 Other long term (current) drug therapy: Secondary | ICD-10-CM | POA: Insufficient documentation

## 2020-04-01 DIAGNOSIS — F17210 Nicotine dependence, cigarettes, uncomplicated: Secondary | ICD-10-CM | POA: Insufficient documentation

## 2020-04-01 DIAGNOSIS — D122 Benign neoplasm of ascending colon: Secondary | ICD-10-CM | POA: Diagnosis not present

## 2020-04-01 DIAGNOSIS — I509 Heart failure, unspecified: Secondary | ICD-10-CM | POA: Diagnosis not present

## 2020-04-01 DIAGNOSIS — G4733 Obstructive sleep apnea (adult) (pediatric): Secondary | ICD-10-CM | POA: Diagnosis not present

## 2020-04-01 DIAGNOSIS — R195 Other fecal abnormalities: Secondary | ICD-10-CM | POA: Diagnosis present

## 2020-04-01 HISTORY — PX: COLONOSCOPY WITH PROPOFOL: SHX5780

## 2020-04-01 HISTORY — DX: Type 2 diabetes mellitus without complications: E11.9

## 2020-04-01 LAB — GLUCOSE, CAPILLARY: Glucose-Capillary: 159 mg/dL — ABNORMAL HIGH (ref 70–99)

## 2020-04-01 SURGERY — COLONOSCOPY WITH PROPOFOL
Anesthesia: General

## 2020-04-01 MED ORDER — PROPOFOL 10 MG/ML IV BOLUS
INTRAVENOUS | Status: DC | PRN
  Administered 2020-04-01: 30 mg via INTRAVENOUS
  Administered 2020-04-01: 50 mg via INTRAVENOUS
  Administered 2020-04-01: 70 mg via INTRAVENOUS

## 2020-04-01 MED ORDER — DEXMEDETOMIDINE (PRECEDEX) IN NS 20 MCG/5ML (4 MCG/ML) IV SYRINGE
PREFILLED_SYRINGE | INTRAVENOUS | Status: DC | PRN
  Administered 2020-04-01: 4 ug via INTRAVENOUS
  Administered 2020-04-01 (×2): 8 ug via INTRAVENOUS

## 2020-04-01 MED ORDER — PHENYLEPHRINE HCL (PRESSORS) 10 MG/ML IV SOLN
INTRAVENOUS | Status: DC | PRN
  Administered 2020-04-01: 100 ug via INTRAVENOUS

## 2020-04-01 MED ORDER — LIDOCAINE HCL (CARDIAC) PF 100 MG/5ML IV SOSY
PREFILLED_SYRINGE | INTRAVENOUS | Status: DC | PRN
  Administered 2020-04-01: 100 mg via INTRAVENOUS

## 2020-04-01 MED ORDER — PROPOFOL 500 MG/50ML IV EMUL
INTRAVENOUS | Status: DC | PRN
  Administered 2020-04-01: 140 ug/kg/min via INTRAVENOUS

## 2020-04-01 MED ORDER — SODIUM CHLORIDE 0.9 % IV SOLN: INTRAVENOUS | Status: DC

## 2020-04-01 NOTE — Transfer of Care (Signed)
Immediate Anesthesia Transfer of Care Note  Patient: Ronald Malone  Procedure(s) Performed: COLONOSCOPY WITH PROPOFOL (N/A )  Patient Location: PACU  Anesthesia Type:General  Level of Consciousness: sedated  Airway & Oxygen Therapy: Patient Spontanous Breathing and Patient connected to nasal cannula oxygen  Post-op Assessment: Report given to RN and Post -op Vital signs reviewed and stable  Post vital signs: Reviewed and stable  Last Vitals:  Vitals Value Taken Time  BP 93/65 04/01/20 1027  Temp 36.7 C 04/01/20 1025  Pulse 86 04/01/20 1027  Resp 27 04/01/20 1027  SpO2 90 % 04/01/20 1027  Vitals shown include unvalidated device data.  Last Pain:  Vitals:   04/01/20 1025  TempSrc: Temporal  PainSc:          Complications: No complications documented.

## 2020-04-01 NOTE — H&P (Signed)
Lucilla Lame, MD [address] [place], [place] [address] Phone:[phone] Fax : [phone]  Primary Care Physician:  Paulene Floor Primary Gastroenterologist:  Dr. Allen Norris  Pre-Procedure History & Physical: HPI:  Ronald Malone is a 65 y.o. male is here for an colonoscopy.   Past Medical History:  Diagnosis Date  . CHF (congestive heart failure) ([place])   . Clotting disorder ([place])   . Diabetes mellitus without complication ([place])   . MI (myocardial infarction) ([place])   . Pulmonary embolism [facility])     Past Surgical History:  Procedure Laterality Date  . CARDIAC CATHETERIZATION    . COLON SURGERY     1/3 colon removed as an infant  . COLONOSCOPY WITH PROPOFOL N/A 12/26/2018   Procedure: COLONOSCOPY WITH PROPOFOL;  Surgeon: Lucilla Lame, MD;  Location[facility] ENDOSCOPY;  Service: Endoscopy;  Laterality: N/A;  . heart stent placed    . LOWER EXTREMITY ANGIOGRAPHY Right 10/23/2018   Procedure: LOWER EXTREMITY ANGIOGRAPHY;  Surgeon: Algernon Huxley, MD;  Location: [place] [facility] CV LAB;  Service: Cardiovascular;  Laterality: Right;    Prior to Admission medications   Medication Sig Start Date End Date Taking? Authorizing Provider  ACCU-CHEK GUIDE test strip USE ONCE DAILY IN MORNING (E11.9) 03/05/19  Yes [name] M, PA-C  blood glucose meter kit and supplies Dispense based on patient and insurance preference. Use once daily in the morning. (FOR ICD-10 E11.9). 11/10/18  Yes Pollak, Adriana M, PA-C  Na Sulfate-K Sulfate-Mg Sulf (SUPREP BOWEL PREP KIT) 17.5-3.13-1.6 GM/177ML SOLN Take 1 kit by mouth as directed. 03/25/20  Yes Lucilla Lame, MD  apixaban (ELIQUIS) 2.5 MG TABS tablet Take 1 tablet (2.5 mg total) by mouth 2 (two) times daily. 03/18/20   [name], PA-C  atorvastatin (LIPITOR) 20 MG tablet Take 20 mg by mouth daily. 09/04/19   [provider]  gabapentin (NEURONTIN) 300 MG capsule Take 1 capsule (300 mg total) by mouth at bedtime.  09/04/19   [name], PA-C  latanoprost (XALATAN) 0.005 % ophthalmic solution SMARTSIG:In Eye(s) 02/05/20   [provider]  metFORMIN (GLUCOPHAGE XR) 500 MG 24 hr tablet Take 2 tablets (1,000 mg total) by mouth 2 (two) times daily. 02/19/20 05/19/20  [name], PA-C  nitroGLYCERIN (NITROSTAT) 0.4 MG SL tablet PLACE 1 TABLET (0.4 MG TOTAL) UNDER THE TONGUE EVERY 5 (FIVE) MINUTES AS NEEDED FOR CHEST PAIN. Patient not taking: Reported on 12/26/2018 12/25/18 03/25/19  [name], PA-C    Allergies as of 02/26/2020  . (No Known Allergies)    Family History  Problem Relation Age of Onset  . Parkinson's disease Mother   . Dementia Father     Social History   Socioeconomic History  . Marital status: Married    Spouse name: [name]   . Number of children: 2  . Years of education: Not on file  . Highest education level: Not on file  Occupational History  . Occupation: RETIRED   Tobacco Use  . Smoking status: Current Every Day Smoker    Packs/day: 2.00    Years: 45.00    Pack years: 90.00    Types: Cigarettes    Last attempt to quit: 09/15/2018    Years since quitting: 1.5  . Smokeless tobacco: Never Used  Vaping Use  . Vaping Use: Never used  Substance and Sexual Activity  . Alcohol use: Not Currently    Comment: denies ETOH today 09/06/19  . Drug use:  Not Currently    Types: Psilocybin  . Sexual activity: Not on file  Other Topics Concern  . Not on file  Social History Narrative  . Not on file   Social Determinants of Health   Financial Resource Strain: Not on file  Food Insecurity: Not on file  Transportation Needs: Not on file  Physical Activity: Not on file  Stress: Not on file  Social Connections: Not on file  Intimate Partner Violence: Not on file    Review of Systems: See HPI, otherwise negative ROS  Physical Exam: BP 126/63   Pulse 82   Temp (!) 97.2 F (36.2 C) (Temporal)   Resp 18   Ht 6' (1.829 m)   Wt (!) 149.7 kg   SpO2  95%   BMI 44.76 kg/m  General:   Alert,  pleasant and cooperative in NAD Head:  Normocephalic and atraumatic. Neck:  Supple; no masses or thyromegaly. Lungs:  Clear throughout to auscultation.    Heart:  Regular rate and rhythm. Abdomen:  Soft, nontender and nondistended. Normal bowel sounds, without guarding, and without rebound.   Neurologic:  Alert and  oriented x4;  grossly normal neurologically.  Impression/Plan: Ronald Malone is here for an colonoscopy to be performed for positive cologuard   Risks, benefits, limitations, and alternatives regarding  colonoscopy have been reviewed with the patient.  Questions have been answered.  All parties agreeable.   Lucilla Lame, MD  04/01/2020, 9:37 AM

## 2020-04-01 NOTE — Anesthesia Postprocedure Evaluation (Signed)
Anesthesia Post Note  Patient: Ronald Malone  Procedure(s) Performed: COLONOSCOPY WITH PROPOFOL (N/A )  Patient location during evaluation: Endoscopy Anesthesia Type: General Level of consciousness: awake and alert Pain management: pain level controlled Vital Signs Assessment: post-procedure vital signs reviewed and stable Respiratory status: spontaneous breathing, nonlabored ventilation, respiratory function stable and patient connected to nasal cannula oxygen Cardiovascular status: blood pressure returned to baseline and stable Postop Assessment: no apparent nausea or vomiting Anesthetic complications: no   No complications documented.   Last Vitals:  Vitals:   04/01/20 1045 04/01/20 1055  BP: 96/68 113/74  Pulse:    Resp:    Temp:    SpO2:      Last Pain:  Vitals:   04/01/20 1055  TempSrc:   PainSc: 0-No pain                 Martha Clan

## 2020-04-01 NOTE — Anesthesia Preprocedure Evaluation (Signed)
Anesthesia Evaluation  Patient identified by MRN, date of birth, ID band Patient awake    Reviewed: Allergy & Precautions, NPO status , Patient's Chart, lab work & pertinent test results  History of Anesthesia Complications Negative for: history of anesthetic complications  Airway Mallampati: II  TM Distance: >3 FB Neck ROM: Full    Dental  (+) Partial Lower, Dental Advidsory Given   Pulmonary neg shortness of breath, sleep apnea , COPD, neg recent URI, Current SmokerPatient did not abstain from smoking.,  H/o PE   breath sounds clear to auscultation- rhonchi (-) wheezing      Cardiovascular Exercise Tolerance: Poor + CAD, + Past MI, + Peripheral Vascular Disease, +CHF (preserved EF) and + DVT  (-) Cardiac Stents and (-) CABG  Rhythm:Regular Rate:Normal - Systolic murmurs and - Diastolic murmurs Echo 06/10/49: 1. The left ventricle has normal systolic function with an ejection fraction of 60-65%. The cavity size was normal. There is mildly increased left ventricular wall thickness. Left ventricular diastolic Doppler parameters are consistent with  pseudonormalization.  2. The right ventricle has normal systolic function. The cavity was normal. There is no increase in right ventricular wall thickness.  3. The aortic valve is tricuspid. Aortic valve regurgitation was not assessed by color flow Doppler.   Neuro/Psych neg Seizures negative neurological ROS  negative psych ROS   GI/Hepatic negative GI ROS, Neg liver ROS,   Endo/Other  diabetesMorbid obesity  Renal/GU negative Renal ROS     Musculoskeletal negative musculoskeletal ROS (+)   Abdominal (+) + obese,   Peds  Hematology negative hematology ROS (+)   Anesthesia Other Findings Past Medical History: No date: CHF (congestive heart failure) (HCC) No date: Clotting disorder ([place]) No date: MI (myocardial infarction) ([place]) No date: Pulmonary embolism (HCC)    Reproductive/Obstetrics                             Anesthesia Physical  Anesthesia Plan  ASA: III  Anesthesia Plan: General   Post-op Pain Management:    Induction: Intravenous  PONV Risk Score and Plan: 0 and Propofol infusion and TIVA  Airway Management Planned: Natural Airway and Nasal Cannula  Additional Equipment:   Intra-op Plan:   Post-operative Plan:   Informed Consent: I have reviewed the patients History and Physical, chart, labs and discussed the procedure including the risks, benefits and alternatives for the proposed anesthesia with the patient or authorized representative who has indicated his/her understanding and acceptance.     Dental advisory given  Plan Discussed with: CRNA and Anesthesiologist  Anesthesia Plan Comments:         Anesthesia Quick Evaluation

## 2020-04-01 NOTE — Op Note (Signed)
[facility]astroenterology Patient Name: Rachit Grim Procedure Date: 04/01/2020 9:46 AM MRN: [id number] Account #: [id number] Date of Birth: 02-Oct-1955 Admit Type: Outpatient Age: 65 Room: [facility] ENDO ROOM 4 Gender: Male Note Status: Finalized Procedure:             Colonoscopy Indications:           Positive Cologuard test Providers:             Lucilla Lame MD, MD Referring MD:          Wendee Beavers. Terrilee Croak (Referring MD) Medicines:             Propofol per Anesthesia Complications:         No immediate complications. Procedure:             Pre-Anesthesia Assessment:                        - Prior to the procedure, a History and Physical was                         performed, and patient medications and allergies were                         reviewed. The patient's tolerance of previous                         anesthesia was also reviewed. The risks and benefits                         of the procedure and the sedation options and risks                         were discussed with the patient. All questions were                         answered, and informed consent was obtained. Prior                         Anticoagulants: The patient has taken no previous                         anticoagulant or antiplatelet agents. ASA Grade                         Assessment: II - A patient with mild systemic disease.                         After reviewing the risks and benefits, the patient                         was deemed in satisfactory condition to undergo the                         procedure.                        After obtaining informed consent, the colonoscope was  passed under direct vision. Throughout the procedure,                         the patient's blood pressure, pulse, and oxygen                         saturations were monitored continuously. The                         Colonoscope was introduced through the anus and                          advanced to the the cecum, identified by appendiceal                         orifice and ileocecal valve. The colonoscopy was                         performed without difficulty. The patient tolerated                         the procedure well. The quality of the bowel                         preparation was good. Findings:      The perianal and digital rectal examinations were normal.      A 2 mm polyp was found in the cecum. The polyp was sessile. The polyp       was removed with a cold snare. Resection was complete, but the polyp       tissue was not retrieved.      Two sessile polyps were found in the transverse colon. The polyps were 4       to 5 mm in size. These polyps were removed with a cold snare. Resection       and retrieval were complete.      Two sessile polyps were found in the transverse colon. The polyps were 4       to 5 mm in size. These polyps were removed with a cold snare. Resection       and retrieval were complete.      Two sessile polyps were found in the sigmoid colon. The polyps were 2 to       4 mm in size. These polyps were removed with a cold snare. Resection and       retrieval were complete. Impression:            - One 2 mm polyp in the cecum, removed with a cold                         snare. Complete resection. Polyp tissue not retrieved.                        - Two 4 to 5 mm polyps in the transverse colon,                         removed with a cold snare. Resected and retrieved.                        -  Two 4 to 5 mm polyps in the transverse colon,                         removed with a cold snare. Resected and retrieved.                        - Two 2 to 4 mm polyps in the sigmoid colon, removed                         with a cold snare. Resected and retrieved. Recommendation:        - Discharge patient to home.                        - Resume previous diet.                        - Continue present medications.                        -  Await pathology results.                        - Repeat colonoscopy in 5 years if polyp adenoma and                         10 years if hyperplastic Procedure Code(s):     --- Professional ---                        [phone], Colonoscopy, flexible; with removal of                         tumor(s), polyp(s), or other lesion(s) by snare                         technique Diagnosis Code(s):     --- Professional ---                        R19.5, Other fecal abnormalities                        K63.5, Polyp of colon CPT copyright 2019 American Medical Association. All rights reserved. The codes documented in this report are preliminary and upon coder review may  be revised to meet current compliance requirements. Lucilla Lame MD, MD 04/01/2020 10:26:13 AM This report has been signed electronically. Number of Addenda: 0 Note Initiated On: 04/01/2020 9:46 AM Scope Withdrawal Time: 0 hours 9 minutes 50 seconds  Total Procedure Duration: 0 hours 30 minutes 10 seconds  Estimated Blood Loss:  Estimated blood loss: none.      [facility]

## 2020-04-02 ENCOUNTER — Encounter: Payer: Self-pay | Admitting: Gastroenterology

## 2020-04-02 LAB — SURGICAL PATHOLOGY

## 2020-04-08 DIAGNOSIS — H40003 Preglaucoma, unspecified, bilateral: Secondary | ICD-10-CM | POA: Diagnosis not present

## 2020-04-09 DIAGNOSIS — I517 Cardiomegaly: Secondary | ICD-10-CM | POA: Diagnosis not present

## 2020-04-09 DIAGNOSIS — G4733 Obstructive sleep apnea (adult) (pediatric): Secondary | ICD-10-CM | POA: Diagnosis not present

## 2020-04-09 DIAGNOSIS — J310 Chronic rhinitis: Secondary | ICD-10-CM | POA: Diagnosis not present

## 2020-04-09 DIAGNOSIS — R942 Abnormal results of pulmonary function studies: Secondary | ICD-10-CM | POA: Diagnosis not present

## 2020-04-09 DIAGNOSIS — J449 Chronic obstructive pulmonary disease, unspecified: Secondary | ICD-10-CM | POA: Diagnosis not present

## 2020-04-09 DIAGNOSIS — R0602 Shortness of breath: Secondary | ICD-10-CM | POA: Diagnosis not present

## 2020-04-09 NOTE — Progress Notes (Unsigned)
Subjective:   Ronald Malone is a 65 y.o. male who presents for an Initial Medicare Annual Wellness Visit.  I connected with Ronald Malone today by telephone and verified that I am speaking with the correct person using two identifiers. Location patient: home Location provider: work Persons participating in the virtual visit: patient, provider.   I discussed the limitations, risks, security and privacy concerns of performing an evaluation and management service by telephone and the availability of in person appointments. I also discussed with the patient that there may be a patient responsible charge related to this service. The patient expressed understanding and verbally consented to this telephonic visit.    Interactive audio and video telecommunications were attempted between this provider and patient, however failed, due to patient having technical difficulties OR patient did not have access to video capability.  We continued and completed visit with audio only.   Review of Systems    N/A  Cardiac Risk Factors include: advanced age (>38mn, >>61women);diabetes mellitus;dyslipidemia;smoking/ tobacco exposure;male gender     Objective:    There were no vitals filed for this visit. There is no height or weight on file to calculate BMI.  Advanced Directives 04/10/2020 04/01/2020 02/19/2020 09/06/2019 08/21/2019 02/21/2019 12/26/2018  Does Patient Have a Medical Advance Directive? No No No No No No No  Would patient like information on creating a medical advance directive? No - Patient declined No - Patient declined - No - Patient declined No - Patient declined - -    Current Medications (verified) Outpatient Encounter Medications as of 04/10/2020  Medication Sig  . apixaban (ELIQUIS) 2.5 MG TABS tablet Take 1 tablet (2.5 mg total) by mouth 2 (two) times daily.  .Marland Kitchenatorvastatin (LIPITOR) 20 MG tablet Take 20 mg by mouth daily.  . COMBIVENT RESPIMAT 20-100 MCG/ACT AERS respimat Inhale  1 puff into the lungs every 6 (six) hours as needed.  . gabapentin (NEURONTIN) 300 MG capsule Take 1 capsule (300 mg total) by mouth at bedtime.  .Marland Kitchenlatanoprost (XALATAN) 0.005 % ophthalmic solution Place 1 drop into both eyes at bedtime.  . metFORMIN (GLUCOPHAGE XR) 500 MG 24 hr tablet Take 2 tablets (1,000 mg total) by mouth 2 (two) times daily.  .Marland KitchenACCU-CHEK GUIDE test strip USE ONCE DAILY IN MORNING (E11.9) (Patient not taking: Reported on 04/10/2020)  . blood glucose meter kit and supplies Dispense based on patient and insurance preference. Use once daily in the morning. (FOR ICD-10 E11.9). (Patient not taking: Reported on 04/10/2020)  . Na Sulfate-K Sulfate-Mg Sulf (SUPREP BOWEL PREP KIT) 17.5-3.13-1.6 GM/177ML SOLN Take 1 kit by mouth as directed. (Patient not taking: Reported on 04/10/2020)  . nitroGLYCERIN (NITROSTAT) 0.4 MG SL tablet PLACE 1 TABLET (0.4 MG TOTAL) UNDER THE TONGUE EVERY 5 (FIVE) MINUTES AS NEEDED FOR CHEST PAIN. (Patient not taking: Reported on 12/26/2018)   No facility-administered encounter medications on file as of 04/10/2020.    Allergies (verified) Patient has no known allergies.   History: Past Medical History:  Diagnosis Date  . CHF (congestive heart failure) ([place]   . Clotting disorder ([place]   . Diabetes mellitus without complication ([place]   . Hyperlipidemia   . MI (myocardial infarction) ([place]   . [facility] embolism ([facility]    Past Surgical History:  Procedure Laterality Date  . CARDIAC CATHETERIZATION    . COLON SURGERY     1/3 colon removed as an infant  . COLONOSCOPY WITH PROPOFOL N/A 12/26/2018   Procedure: COLONOSCOPY WITH PROPOFOL;  Surgeon: Lucilla Lame, MD;  Location: [facility] ENDOSCOPY;  Service: Endoscopy;  Laterality: N/A;  . COLONOSCOPY WITH PROPOFOL N/A 04/01/2020   Procedure: COLONOSCOPY WITH PROPOFOL;  Surgeon: Lucilla Lame, MD;  Location: [facility] ENDOSCOPY;  Service: Endoscopy;  Laterality: N/A;  . heart stent placed    . LOWER EXTREMITY  ANGIOGRAPHY Right 10/23/2018   Procedure: LOWER EXTREMITY ANGIOGRAPHY;  Surgeon: Algernon Huxley, MD;  Location: [place] CV LAB;  Service: Cardiovascular;  Laterality: Right;   Family History  Problem Relation Age of Onset  . Parkinson's disease Mother   . Dementia Father    Social History   Socioeconomic History  . Marital status: Married    Spouse name: Ronald Malone   . Number of children: 2  . Years of education: Not on file  . Highest education level: Bachelor's degree (e.g., BA, AB, BS)  Occupational History  . Occupation: RETIRED   Tobacco Use  . Smoking status: Current Every Day Smoker    Packs/day: 2.00    Years: 45.00    Pack years: 90.00    Types: Cigarettes    Last attempt to quit: 09/15/2018    Years since quitting: 1.5  . Smokeless tobacco: Never Used  Vaping Use  . Vaping Use: Never used  Substance and Sexual Activity  . Alcohol use: Not Currently    Comment: denies ETOH today 09/06/19  . Drug use: Not Currently    Types: Psilocybin  . Sexual activity: Not on file  Other Topics Concern  . Not on file  Social History Narrative  . Not on file   Social Determinants of Health   Financial Resource Strain: Low Risk   . Difficulty of Paying Living Expenses: Not hard at all  Food Insecurity: No Food Insecurity  . Worried About Charity fundraiser in the Last Year: Never true  . Ran Out of Food in the Last Year: Never true  Transportation Needs: No Transportation Needs  . Lack of Transportation (Medical): No  . Lack of Transportation (Non-Medical): No  Physical Activity: Inactive  . Days of Exercise per Week: 0 days  . Minutes of Exercise per Session: 0 min  Stress: No Stress Concern Present  . Feeling of Stress : Not at all  Social Connections: Moderately Integrated  . Frequency of Communication with Friends and Family: More than three times a week  . Frequency of Social Gatherings with Friends and Family: Once a week  . Attends Religious Services: Never  .  Active Member of Clubs or Organizations: Yes  . Attends Archivist Meetings: Never  . Marital Status: Married    Tobacco Counseling Ready to quit: No Counseling given: No   Clinical Intake:  Pre-visit preparation completed: Yes  Pain : No/denies pain     Nutritional Risks: None Diabetes: Yes  How often do you need to have someone help you when you read instructions, pamphlets, or other written materials from your doctor or pharmacy?: 1 - Never  Diabetic? Yes  Nutrition Risk Assessment:  Has the patient had any N/V/D within the last 2 months?  No  Does the patient have any non-healing wounds?  No  Has the patient had any unintentional weight loss or weight gain?  No   Diabetes:  Is the patient diabetic?  Yes  If diabetic, was a CBG obtained today?  No  Did the patient bring in their glucometer from home?  No  How often do you monitor your CBG's? Does not check BS.  Financial Strains and Diabetes Management:  Are you having any financial strains with the device, your supplies or your medication? No .  Does the patient want to be seen by Chronic Care Management for management of their diabetes?  No  Would the patient like to be referred to a Nutritionist or for Diabetic Management?  No   Diabetic Exams:  Diabetic Eye Exam: Completed 01/21/20 Diabetic Foot Exam: Currently due.   Interpreter Needed?: No  Information entered by :: [facility], LPN   Activities of Daily Living In your present state of health, do you have any difficulty performing the following activities: 04/10/2020 02/19/2020  Hearing? N Y  Vision? N Y  Difficulty concentrating or making decisions? N N  Walking or climbing stairs? Y Y  Comment Due to SOB. -  Dressing or bathing? N N  Doing errands, shopping? N N  Preparing Food and eating ? N -  Using the Toilet? N -  In the past six months, have you accidently leaked urine? Y -  Comment Occasionally if doesnt empty bladder in time.  -  Do you have problems with loss of bowel control? N -  Managing your Medications? N -  Managing your Finances? Y -  Comment Daughter manages finances. -  Housekeeping or managing your Housekeeping? N -  Some recent data might be hidden    Patient Care Team: Paulene Floor as PCP - General (Physician Assistant) Neldon Labella, RN as Registered Nurse Lucilla Lame, MD as Consulting Physician (Gastroenterology) Erby Pian, MD as Referring Physician (Specialist) Yolonda Kida, MD as Consulting Physician (Cardiology) Dingeldein, Remo Lipps, MD (Ophthalmology)  Indicate any recent Medical Services you may have received from other than [facility] providers in the past year (date may be approximate).     Assessment:   This is a routine wellness examination for [place].  Hearing/Vision screen No exam data present  Dietary issues and exercise activities discussed: Current Exercise Habits: The patient does not participate in regular exercise at present, Exercise limited by: None identified  Goals      Patient Stated   .  Exercise for Diabetes (pt-stated)      Current Barriers:  Marland Kitchen Knowledge Deficits related to basic Diabetes pathophysiology and self care/management   Case Manager Clinical Goal(s):  Over the next 60 days, patient will demonstrate improved adherence to prescribed treatment plan for diabetes self care/management as evidenced by:  . daily monitoring and recording of CBG  . adherence to ADA/ carb modified diet . exercise 3 days/week . adherence to prescribed medication regimen  Interventions:  . Provided education to patient about basic DM disease process . Provided patient with written educational materials related to hypo and hyperglycemia and importance of correct treatment . Counseling on ADA diet . Silver Sneakers/gym benefits of [facility] AARP plan  Patient Self Care Activities:  . Checks blood sugars as prescribed and utilize hyper and hypoglycemia  protocol as needed . Adheres to prescribed ADA/carb modified diet  Initial goal documentation       Other   .  Quit Smoking      Recommend to continue efforts to reduce smoking habits until no longer smoking.       Depression Screen PHQ 2/9 Scores 04/10/2020 02/19/2020 10/18/2018 10/05/2018  PHQ - 2 Score 0 0 0 0  PHQ- 9 Score - 6 1 1     Fall Risk Fall Risk  04/10/2020 02/19/2020 11/29/2019 10/25/2019 10/16/2019  Falls in the past year? 0 0 0  0 0  Number falls in past yr: 0 0 0 0 -  Injury with Fall? 0 0 0 0 -  Risk for fall due to : - No Fall Risks - - -  Follow up - Falls evaluation completed - - -    FALL RISK PREVENTION PERTAINING TO THE HOME:  Any stairs in or around the home? Yes  If so, are there any without handrails? No  Home free of loose throw rugs in walkways, pet beds, electrical cords, etc? Yes  Adequate lighting in your home to reduce risk of falls? Yes   ASSISTIVE DEVICES UTILIZED TO PREVENT FALLS:  Life alert? No  Use of a cane, walker or w/c? No  Grab bars in the bathroom? Yes  Shower chair or bench in shower? No  Elevated toilet seat or a handicapped toilet? No    Cognitive Function: Declined today.         Immunizations Immunization History  Administered Date(s) Administered  . Fluad Quad(high Dose 65+) 01/02/2019, 01/23/2020  . Moderna Sars-Covid-2 Vaccination 03/28/2019, 04/25/2019, 01/07/2020  . Pneumococcal Conjugate-13 10/18/2018  . Pneumococcal Polysaccharide-23 03/24/2020  . Tdap 09/18/2019  . Zoster Recombinat (Shingrix) 02/11/2020    TDAP status: Up to date  Flu Vaccine status: Up to date  Pneumococcal vaccine status: Up to date  Covid-19 vaccine status: Completed vaccines  Qualifies for Shingles Vaccine? Yes   Zostavax completed No   Shingrix Completed?: Up to date, Shingrix second dose due 04/2020.  Screening Tests Health Maintenance  Topic Date Due  . FOOT EXAM  11/14/2019  . HEMOGLOBIN A1C  08/19/2020  . OPHTHALMOLOGY  EXAM  01/20/2021  . URINE MICROALBUMIN  02/18/2021  . COLONOSCOPY (Pts 45-30yr Insurance coverage will need to be confirmed)  04/01/2025  . TETANUS/TDAP  09/17/2029  . INFLUENZA VACCINE  Completed  . COVID-19 Vaccine  Completed  . Hepatitis C Screening  Completed  . PNA vac Low Risk Adult  Completed    Health Maintenance  Health Maintenance Due  Topic Date Due  . FOOT EXAM  11/14/2019    Colorectal cancer screening: Type of screening: Colonoscopy. Completed 04/01/20. Repeat every 5 years  Lung Cancer Screening: (Low Dose CT Chest recommended if Age 67-80years, 30 pack-year currently smoking OR have quit w/in 15years.) does qualify however had a chest xray yesterday and declines a CT scan at this time.    Additional Screening:  Hepatitis C Screening: Up to date  Vision Screening: Recommended annual ophthalmology exams for early detection of glaucoma and other disorders of the eye. Is the patient up to date with their annual eye exam?  Yes  Who is the provider or what is the name of the office in which the patient attends annual eye exams? Dr [facility]ingeldein [facility] pt is not established with a provider, would they like to be referred to a provider to establish care? No .   Dental Screening: Recommended annual dental exams for proper oral hygiene  Community Resource Referral / Chronic Care Management: CRR required this visit?  No   CCM required this visit?  No      Plan:     I have personally reviewed and noted the following in the patient's chart:   . Medical and social history . Use of alcohol, tobacco or illicit drugs  . Current medications and supplements . Functional ability and status . Nutritional status . Physical activity . Advanced directives . List of other physicians . Hospitalizations, surgeries, and ER  visits in previous 12 months . Vitals . Screenings to include cognitive, depression, and falls . Referrals and appointments  In addition, I have  reviewed and discussed with patient certain preventive protocols, quality metrics, and best practice recommendations. A written personalized care plan for preventive services as well as general preventive health recommendations were provided to patient.     Naftoli Malone [place], [place]   04/22/9098   Nurse Notes: Pt needs a diabetic foot exam at next in office apt.

## 2020-04-10 ENCOUNTER — Ambulatory Visit (INDEPENDENT_AMBULATORY_CARE_PROVIDER_SITE_OTHER): Payer: Medicare Other

## 2020-04-10 ENCOUNTER — Other Ambulatory Visit: Payer: Self-pay

## 2020-04-10 DIAGNOSIS — Z0000 Encounter for general adult medical examination without abnormal findings: Secondary | ICD-10-CM | POA: Diagnosis not present

## 2020-04-10 NOTE — Patient Instructions (Signed)
Ronald Malone , Thank you for taking time to come for your Medicare Wellness Visit. I appreciate your ongoing commitment to your health goals. Please review the following plan we discussed and let me know if I can assist you in the future.   Screening recommendations/referrals: Colonoscopy: Up to date, due 03/2025 Recommended yearly ophthalmology/optometry visit for glaucoma screening and checkup Recommended yearly dental visit for hygiene and checkup  Vaccinations: Influenza vaccine: Done 01/22/21 Pneumococcal vaccine: Completed series Tdap vaccine: Up to date, due 09/2029 Shingles vaccine: Up to date    Advanced directives: Advance directive discussed with you today. Even though you declined this today please call our office should you change your mind and we can give you the proper paperwork for you to fill out.  Conditions/risks identified: Smoking cessation discussed today.   Next appointment: 04/24/20 @ 2:30 PM with [place] 65 Years and Older, Male Preventive care refers to lifestyle choices and visits with your health care provider that can promote health and wellness. What does preventive care include?  A yearly physical exam. This is also called an annual well check.  Dental exams once or twice a year.  Routine eye exams. Ask your health care provider how often you should have your eyes checked.  Personal lifestyle choices, including:  Daily care of your teeth and gums.  Regular physical activity.  Eating a healthy diet.  Avoiding tobacco and drug use.  Limiting alcohol use.  Practicing safe sex.  Taking low doses of aspirin every day.  Taking vitamin and mineral supplements as recommended by your health care provider. What happens during an annual well check? The services and screenings done by your health care provider during your annual well check will depend on your age, overall health, lifestyle risk factors, and family history of  disease. Counseling  Your health care provider may ask you questions about your:  Alcohol use.  Tobacco use.  Drug use.  Emotional well-being.  Home and relationship well-being.  Sexual activity.  Eating habits.  History of falls.  Memory and ability to understand (cognition).  Work and work Statistician. Screening  You may have the following tests or measurements:  Height, weight, and BMI.  Blood pressure.  Lipid and cholesterol levels. These may be checked every 5 years, or more frequently if you are over 70 years old.  Skin check.  Lung cancer screening. You may have this screening every year starting at age 49 if you have a 30-pack-year history of smoking and currently smoke or have quit within the past 15 years.  Fecal occult blood test (FOBT) of the stool. You may have this test every year starting at age 9.  Flexible sigmoidoscopy or colonoscopy. You may have a sigmoidoscopy every 5 years or a colonoscopy every 10 years starting at age 69.  Prostate cancer screening. Recommendations will vary depending on your family history and other risks.  Hepatitis C blood test.  Hepatitis B blood test.  Sexually transmitted disease (STD) testing.  Diabetes screening. This is done by checking your blood sugar (glucose) after you have not eaten for a while (fasting). You may have this done every 1-3 years.  Abdominal aortic aneurysm (AAA) screening. You may need this if you are a current or former smoker.  Osteoporosis. You may be screened starting at age 69 if you are at high risk. Talk with your health care provider about your test results, treatment options, and if necessary, the need for more tests.  Vaccines  Your health care provider may recommend certain vaccines, such as:  Influenza vaccine. This is recommended every year.  Tetanus, diphtheria, and acellular pertussis (Tdap, Td) vaccine. You may need a Td booster every 10 years.  Zoster vaccine. You may  need this after age 55.  Pneumococcal 13-valent conjugate (PCV13) vaccine. One dose is recommended after age 64.  Pneumococcal polysaccharide (PPSV23) vaccine. One dose is recommended after age 43. Talk to your health care provider about which screenings and vaccines you need and how often you need them. This information is not intended to replace advice given to you by your health care provider. Make sure you discuss any questions you have with your health care provider. Document Released: 03/28/2015 Document Revised: 11/19/2015 Document Reviewed: 12/31/2014 Elsevier Interactive Patient Education  2017 [place] Prevention in the Home Falls can cause injuries. They can happen to people of all ages. There are many things you can do to make your home safe and to help prevent falls. What can I do on the outside of my home?  Regularly fix the edges of walkways and driveways and fix any cracks.  Remove anything that might make you trip as you walk through a door, such as a raised step or threshold.  Trim any bushes or trees on the path to your home.  Use bright outdoor lighting.  Clear any walking paths of anything that might make someone trip, such as rocks or tools.  Regularly check to see if handrails are loose or broken. Make sure that both sides of any steps have handrails.  Any raised decks and porches should have guardrails on the edges.  Have any leaves, snow, or ice cleared regularly.  Use sand or salt on walking paths during winter.  Clean up any spills in your garage right away. This includes oil or grease spills. What can I do in the bathroom?  Use night lights.  Install grab bars by the toilet and in the tub and shower. Do not use towel bars as grab bars.  Use non-skid mats or decals in the tub or shower.  If you need to sit down in the shower, use a plastic, non-slip stool.  Keep the floor dry. Clean up any water that spills on the floor as soon as it  happens.  Remove soap buildup in the tub or shower regularly.  Attach bath mats securely with double-sided non-slip rug tape.  Do not have throw rugs and other things on the floor that can make you trip. What can I do in the bedroom?  Use night lights.  Make sure that you have a light by your bed that is easy to reach.  Do not use any sheets or blankets that are too big for your bed. They should not hang down onto the floor.  Have a firm chair that has side arms. You can use this for support while you get dressed.  Do not have throw rugs and other things on the floor that can make you trip. What can I do in the kitchen?  Clean up any spills right away.  Avoid walking on wet floors.  Keep items that you use a lot in easy-to-reach places.  If you need to reach something above you, use a strong step stool that has a grab bar.  Keep electrical cords out of the way.  Do not use floor polish or wax that makes floors slippery. If you must use wax, use non-skid floor wax.  Do not have throw rugs and other things on the floor that can make you trip. What can I do with my stairs?  Do not leave any items on the stairs.  Make sure that there are handrails on both sides of the stairs and use them. Fix handrails that are broken or loose. Make sure that handrails are as long as the stairways.  Check any carpeting to make sure that it is firmly attached to the stairs. Fix any carpet that is loose or worn.  Avoid having throw rugs at the top or bottom of the stairs. If you do have throw rugs, attach them to the floor with carpet tape.  Make sure that you have a light switch at the top of the stairs and the bottom of the stairs. If you do not have them, ask someone to add them for you. What else can I do to help prevent falls?  Wear shoes that:  Do not have high heels.  Have rubber bottoms.  Are comfortable and fit you well.  Are closed at the toe. Do not wear sandals.  If you  use a stepladder:  Make sure that it is fully opened. Do not climb a closed stepladder.  Make sure that both sides of the stepladder are locked into place.  Ask someone to hold it for you, if possible.  Clearly mark and make sure that you can see:  Any grab bars or handrails.  First and last steps.  Where the edge of each step is.  Use tools that help you move around (mobility aids) if they are needed. These include:  Canes.  Walkers.  Scooters.  Crutches.  Turn on the lights when you go into a dark area. Replace any light bulbs as soon as they burn out.  Set up your furniture so you have a clear path. Avoid moving your furniture around.  If any of your floors are uneven, fix them.  If there are any pets around you, be aware of where they are.  Review your medicines with your doctor. Some medicines can make you feel dizzy. This can increase your chance of falling. Ask your doctor what other things that you can do to help prevent falls. This information is not intended to replace advice given to you by your health care provider. Make sure you discuss any questions you have with your health care provider. Document Released: 12/26/2008 Document Revised: 08/07/2015 Document Reviewed: 04/05/2014 Elsevier Interactive Patient Education  2017 [employer].

## 2020-04-12 ENCOUNTER — Encounter: Payer: Self-pay | Admitting: Physician Assistant

## 2020-04-14 NOTE — Telephone Encounter (Signed)
Need more info, probably needs OV.

## 2020-04-15 NOTE — Progress Notes (Signed)
Established patient visit   Patient: Ronald Malone   DOB: May 20, 1955   65 y.o. Male  MRN: [id number] Visit Date: 04/16/2020  Today's healthcare provider: [name], PA-C   Chief Complaint  Patient presents with  . Hearing Problem   Subjective    HPI  Patient returns that he hears his heart beating for a couple of weeks now and it is abnormal. He feels his heart beat in his head. He states that he can count his heart rate while sitting. Patient reports he is woke up out of his sleep from his heart beating. He denies any headache, lightheadedness, dizziness, palpitations or chest pain/ pressure. No vision change, no chest pain.      Medications: Outpatient Medications Prior to Visit  Medication Sig  . ACCU-CHEK GUIDE test strip USE ONCE DAILY IN MORNING (E11.9)  . apixaban (ELIQUIS) 2.5 MG TABS tablet Take 1 tablet (2.5 mg total) by mouth 2 (two) times daily.  Marland Kitchen atorvastatin (LIPITOR) 20 MG tablet Take 20 mg by mouth daily.  . blood glucose meter kit and supplies Dispense based on patient and insurance preference. Use once daily in the morning. (FOR ICD-10 E11.9).  . COMBIVENT RESPIMAT 20-100 MCG/ACT AERS respimat Inhale 1 puff into the lungs every 6 (six) hours as needed.  . gabapentin (NEURONTIN) 300 MG capsule Take 1 capsule (300 mg total) by mouth at bedtime.  Marland Kitchen latanoprost (XALATAN) 0.005 % ophthalmic solution Place 1 drop into both eyes at bedtime.  . metFORMIN (GLUCOPHAGE XR) 500 MG 24 hr tablet Take 2 tablets (1,000 mg total) by mouth 2 (two) times daily.  . Na Sulfate-K Sulfate-Mg Sulf (SUPREP BOWEL PREP KIT) 17.5-3.13-1.6 GM/177ML SOLN Take 1 kit by mouth as directed. (Patient not taking: Reported on 04/10/2020)  . nitroGLYCERIN (NITROSTAT) 0.4 MG SL tablet PLACE 1 TABLET (0.4 MG TOTAL) UNDER THE TONGUE EVERY 5 (FIVE) MINUTES AS NEEDED FOR CHEST PAIN. (Patient not taking: Reported on 12/26/2018)   No facility-administered medications prior to visit.     Review of Systems  Respiratory: Negative for chest tightness.   Cardiovascular: Negative for chest pain and palpitations.  Neurological: Negative for dizziness, light-headedness and headaches.       Objective    BP 140/68 (BP Location: Left Arm, Patient Position: Sitting, Cuff Size: Large)   Pulse 88   Temp (!) 97.5 F (36.4 C) (Oral)   Wt (!) 330 lb 6.4 oz (149.9 kg)   SpO2 97%   BMI 44.81 kg/m     Physical Exam Constitutional:      Appearance: Normal appearance. He is obese.  Cardiovascular:     Rate and Rhythm: Normal rate and regular rhythm.     Heart sounds: Normal heart sounds.  Pulmonary:     Effort: Pulmonary effort is normal.     Breath sounds: Normal breath sounds.  Skin:    General: Skin is warm and dry.  Neurological:     General: No focal deficit present.     Mental Status: He is alert and oriented to person, place, and time.  Psychiatric:        Mood and Affect: Mood normal.        Behavior: Behavior normal.       No results found for any visits on 04/16/20.  Assessment & Plan    1. Pulsatile tinnitus  Concerned for vascular malformation, particularly aneurysm as he has a history of slightly elevated BP, chronic tobacco abuse. Will refer  to ENT, he has been seen at [facility] previously.   - Ambulatory referral to ENT   No follow-ups on file.      ITrinna Post, PA-C, have reviewed all documentation for this visit. The documentation on 04/16/20 for the exam, diagnosis, procedures, and orders are all accurate and complete.  The entirety of the information documented in the History of Present Illness, Review of Systems and Physical Exam were personally obtained by me. Portions of this information were initially documented by [facility] and reviewed by me for thoroughness and accuracy.   I spent 20 minutes dedicated to the care of this patient on the date of this encounter to include pre-visit review of records, face-to-face time  with the patient discussing vascular malfomations, and post visit ordering of testing.   Paulene Floor  [facility] [phone] (phone) [phone] (fax)  [place]

## 2020-04-16 ENCOUNTER — Ambulatory Visit (INDEPENDENT_AMBULATORY_CARE_PROVIDER_SITE_OTHER): Payer: Medicare Other | Admitting: Physician Assistant

## 2020-04-16 ENCOUNTER — Encounter: Payer: Self-pay | Admitting: Physician Assistant

## 2020-04-16 ENCOUNTER — Other Ambulatory Visit: Payer: Self-pay

## 2020-04-16 VITALS — BP 140/68 | HR 88 | Temp 97.5°F | Wt 330.4 lb

## 2020-04-16 DIAGNOSIS — H93A9 Pulsatile tinnitus, unspecified ear: Secondary | ICD-10-CM

## 2020-04-16 NOTE — Patient Instructions (Signed)

## 2020-04-24 ENCOUNTER — Telehealth: Payer: Medicare Other

## 2020-04-24 ENCOUNTER — Telehealth: Payer: Self-pay

## 2020-04-24 NOTE — Telephone Encounter (Signed)
  Chronic Care Management   Outreach Note  04/24/2020 Name: Ronald Malone MRN: [id number] DOB: 1955-07-19  Primary Care Provider: [name], PA-C Reason for referral : Chronic Care Management   An unsuccessful telephone outreach was attempted today. [name] was referred to the case management team for assistance with care management and care coordination.     Follow Up Plan:  A HIPAA compliant voice message was left today requesting a return call.    [facility] Care Management [facility] [phone]

## 2020-04-29 ENCOUNTER — Encounter: Payer: Self-pay | Admitting: Physician Assistant

## 2020-05-05 ENCOUNTER — Other Ambulatory Visit: Payer: Self-pay | Admitting: Otolaryngology

## 2020-05-05 DIAGNOSIS — H7201 Central perforation of tympanic membrane, right ear: Secondary | ICD-10-CM | POA: Diagnosis not present

## 2020-05-05 DIAGNOSIS — H93A9 Pulsatile tinnitus, unspecified ear: Secondary | ICD-10-CM | POA: Diagnosis not present

## 2020-05-05 DIAGNOSIS — H903 Sensorineural hearing loss, bilateral: Secondary | ICD-10-CM | POA: Diagnosis not present

## 2020-05-13 ENCOUNTER — Telehealth: Payer: Self-pay

## 2020-05-13 NOTE — Telephone Encounter (Signed)
  Chronic Care Management   Outreach Note  05/13/2020 Name: SIE FORMISANO MRN: [id number] DOB: 1955-05-09  Primary Care Provider: [name], PA-C Reason for referral : Chronic Care Manager   An unsuccessful telephone outreach was attempted today. Mr. Henkels was referred to the case management team for assistance with care management and care coordination.     Follow Up Plan:  Phone rang multiple times today without option to leave a voice message today.  A member of the care management team will attempt to reach Mr. Hallum again within the next week.    [facility] Care Management [facility] [phone]

## 2020-05-19 ENCOUNTER — Ambulatory Visit
Admission: RE | Admit: 2020-05-19 | Discharge: 2020-05-19 | Disposition: A | Discharge status: Home / Self Care | Payer: Medicare Other | Source: Ambulatory Visit | Attending: Otolaryngology | Admitting: Otolaryngology

## 2020-05-19 ENCOUNTER — Other Ambulatory Visit: Payer: Self-pay

## 2020-05-19 DIAGNOSIS — I6523 Occlusion and stenosis of bilateral carotid arteries: Secondary | ICD-10-CM | POA: Diagnosis not present

## 2020-05-19 DIAGNOSIS — H93A9 Pulsatile tinnitus, unspecified ear: Secondary | ICD-10-CM | POA: Diagnosis not present

## 2020-05-19 DIAGNOSIS — H748X3 Other specified disorders of middle ear and mastoid, bilateral: Secondary | ICD-10-CM | POA: Diagnosis not present

## 2020-05-19 DIAGNOSIS — I672 Cerebral atherosclerosis: Secondary | ICD-10-CM | POA: Diagnosis not present

## 2020-05-19 LAB — POCT I-STAT CREATININE: Creatinine, Ser: 0.9 mg/dL (ref 0.61–1.24)

## 2020-05-19 MED ORDER — IOHEXOL 350 MG/ML SOLN
75.0000 mL | Freq: Once | INTRAVENOUS | Status: AC | PRN
  Administered 2020-05-19: 75 mL via INTRAVENOUS

## 2020-05-20 ENCOUNTER — Encounter (INDEPENDENT_AMBULATORY_CARE_PROVIDER_SITE_OTHER): Payer: Self-pay

## 2020-05-22 ENCOUNTER — Encounter: Payer: Self-pay | Admitting: Physician Assistant

## 2020-05-22 DIAGNOSIS — N529 Male erectile dysfunction, unspecified: Secondary | ICD-10-CM

## 2020-05-22 DIAGNOSIS — I6529 Occlusion and stenosis of unspecified carotid artery: Secondary | ICD-10-CM

## 2020-05-26 MED ORDER — SILDENAFIL CITRATE 50 MG PO TABS: 50.0000 mg | ORAL_TABLET | Freq: Every day | ORAL | 0 refills | Status: DC | PRN

## 2020-06-05 ENCOUNTER — Other Ambulatory Visit: Payer: Self-pay

## 2020-06-05 ENCOUNTER — Ambulatory Visit
Admission: RE | Admit: 2020-06-05 | Discharge: 2020-06-05 | Disposition: A | Discharge status: Home / Self Care | Payer: Medicare Other | Source: Ambulatory Visit | Attending: Physician Assistant | Admitting: Physician Assistant

## 2020-06-05 DIAGNOSIS — I6529 Occlusion and stenosis of unspecified carotid artery: Secondary | ICD-10-CM | POA: Insufficient documentation

## 2020-06-05 DIAGNOSIS — I6523 Occlusion and stenosis of bilateral carotid arteries: Secondary | ICD-10-CM | POA: Diagnosis not present

## 2020-06-05 DIAGNOSIS — I771 Stricture of artery: Secondary | ICD-10-CM | POA: Diagnosis not present

## 2020-06-18 DIAGNOSIS — G4733 Obstructive sleep apnea (adult) (pediatric): Secondary | ICD-10-CM | POA: Diagnosis not present

## 2020-06-18 DIAGNOSIS — R5382 Chronic fatigue, unspecified: Secondary | ICD-10-CM | POA: Diagnosis not present

## 2020-06-19 ENCOUNTER — Ambulatory Visit: Payer: Medicare Other | Admitting: Physician Assistant

## 2020-07-01 ENCOUNTER — Ambulatory Visit: Payer: Self-pay

## 2020-07-01 ENCOUNTER — Encounter: Payer: Self-pay | Admitting: Family Medicine

## 2020-07-01 ENCOUNTER — Ambulatory Visit (INDEPENDENT_AMBULATORY_CARE_PROVIDER_SITE_OTHER): Payer: Medicare Other | Admitting: Family Medicine

## 2020-07-01 ENCOUNTER — Other Ambulatory Visit: Payer: Self-pay

## 2020-07-01 VITALS — BP 141/83 | HR 62 | Temp 97.7°F | Resp 22 | Wt 341.0 lb

## 2020-07-01 DIAGNOSIS — Z9989 Dependence on other enabling machines and devices: Secondary | ICD-10-CM | POA: Diagnosis not present

## 2020-07-01 DIAGNOSIS — E119 Type 2 diabetes mellitus without complications: Secondary | ICD-10-CM | POA: Diagnosis not present

## 2020-07-01 DIAGNOSIS — G4733 Obstructive sleep apnea (adult) (pediatric): Secondary | ICD-10-CM

## 2020-07-01 LAB — POCT GLYCOSYLATED HEMOGLOBIN (HGB A1C)
Est. average glucose Bld gHb Est-mCnc: 163
Hemoglobin A1C: 7.3 % — AB (ref 4.0–5.6)

## 2020-07-01 NOTE — Chronic Care Management (AMB) (Signed)
  Chronic Care Management   Note  07/01/2020 Name: Ronald Malone MRN: [id number] DOB: [birth year]   Primary Care Provider: Birdie Sons, MD Reason for referral : Chronic Care Management   [name] was referred to the case management team for assistance with care management and care coordination. Brief discussion today regarding his availability to complete a telephonic outreach. He was completing evaluation with his primary care provider today. Anticipates being available on 07/11/20. Agreed to call with questions or if his telephonic appointment needs to be rescheduled.     Follow Up Plan:  Will follow up on 07/11/20.    [facility] Care Management [facility] [phone]

## 2020-07-01 NOTE — Progress Notes (Signed)
Established patient visit   Patient: Ronald Malone   DOB: 1955/04/14   65 y.o. Male  MRN: [id number] Visit Date: 07/01/2020  Today's healthcare provider: Lelon Huh, MD   Chief Complaint  Patient presents with  . Diabetes   Subjective    HPI  Diabetes Mellitus Type II, Follow-up  Lab Results  Component Value Date   HGBA1C 7.3 (A) 07/01/2020   HGBA1C 6.9 (A) 02/19/2020   HGBA1C 6.7 (A) 08/17/2019   Wt Readings from Last 3 Encounters:  07/01/20 (!) 341 lb (154.7 kg)  04/16/20 (!) 330 lb 6.4 oz (149.9 kg)  04/01/20 (!) 330 lb (149.7 kg)   Last seen for diabetes 4 months ago (seen by [name], PA-C).  Management since then includes continuing same medication. He reports good compliance with treatment.  He is not having side effects.  Symptoms: No fatigue No foot ulcerations  No appetite changes No nausea  Yes paresthesia of the feet  No polydipsia  No polyuria No visual disturbances   No vomiting     Home blood sugar records: blood sugars are not checked  Episodes of hypoglycemia? No    Current insulin regiment: none Most Recent Eye Exam: 01/21/2020 Current exercise: none Current diet habits: in general, an "unhealthy" diet He has stopped consuming soft drinks, but replaced them [employer].   Pertinent Labs: Lab Results  Component Value Date   CHOL 150 02/19/2020   HDL 39 (L) 02/19/2020   LDLCALC 76 02/19/2020   LDLDIRECT 71 10/18/2018   TRIG 207 (H) 02/19/2020   CHOLHDL 3.8 02/19/2020   Lab Results  Component Value Date   NA 137 02/19/2020   K 4.1 02/19/2020   CREATININE 0.90 05/19/2020   GFRNONAA >60 02/19/2020   GFRAA 95 02/19/2020   GLUCOSE 148 (H) 02/19/2020     ---------------------------------------------------------------------------------------------------  Follow up for DVT:  The patient was last seen for this 4 months ago. Changes made at last visit include none. Will assume eliquis 2.5 mg BID maintenance dose that he  will be on indefinitely from oncology.  He reports good compliance with treatment. He feels that condition is Unchanged. He is not having side effects.   ----------------------------------------------------------------------------------------- Follow up OSA He has recently starting using CPAP on autotitrate. He had some trouble with the first several nights, but has been able to keep it on all night the last couple of nights and notes that he hasn't awakened to go to the bathroom all night, and not need to take a nap during the day.      Medications: Outpatient Medications Prior to Visit  Medication Sig  . ACCU-CHEK GUIDE test strip USE ONCE DAILY IN MORNING (E11.9)  . apixaban (ELIQUIS) 2.5 MG TABS tablet Take 1 tablet (2.5 mg total) by mouth 2 (two) times daily.  Marland Kitchen atorvastatin (LIPITOR) 20 MG tablet Take 20 mg by mouth daily.  . blood glucose meter kit and supplies Dispense based on patient and insurance preference. Use once daily in the morning. (FOR ICD-10 E11.9).  . COMBIVENT RESPIMAT 20-100 MCG/ACT AERS respimat Inhale 1 puff into the lungs every 6 (six) hours as needed.  . gabapentin (NEURONTIN) 300 MG capsule Take 1 capsule (300 mg total) by mouth at bedtime.  Marland Kitchen latanoprost (XALATAN) 0.005 % ophthalmic solution Place 1 drop into both eyes at bedtime.  . sildenafil (VIAGRA) 50 MG tablet Take 1 tablet (50 mg total) by mouth daily as needed for erectile dysfunction.  . metFORMIN (  GLUCOPHAGE XR) 500 MG 24 hr tablet Take 2 tablets (1,000 mg total) by mouth 2 (two) times daily.   No facility-administered medications prior to visit.    Review of Systems  Constitutional: Negative for appetite change, chills and fever.  Respiratory: Negative for chest tightness, shortness of breath and wheezing.   Cardiovascular: Positive for leg swelling. Negative for chest pain and palpitations.  Gastrointestinal: Negative for abdominal pain, nausea and vomiting.  Neurological: Positive for  numbness (in feet).       Objective    BP (!) 141/83 (BP Location: Right Arm, Patient Position: Sitting, Cuff Size: Large)   Pulse 62   Temp 97.7 F (36.5 C) (Temporal)   Resp (!) 22   Wt (!) 341 lb (154.7 kg)   SpO2 96%   BMI 46.25 kg/m     Physical Exam    General: Appearance:    Severely obese male in no acute distress  Eyes:    PERRL, conjunctiva/corneas clear, EOM's intact       Lungs:     Clear to auscultation bilaterally, respirations unlabored  Heart:    Normal heart rate. Normal rhythm. No murmurs, rubs, or gallops.   MS:   All extremities are intact.   Neurologic:   Awake, alert, oriented x 3. No apparent focal neurological           defect.        Results for orders placed or performed in visit on 07/01/20  POCT HgB A1C  Result Value Ref Range   Hemoglobin A1C 7.3 (A) 4.0 - 5.6 %   Est. average glucose Bld gHb Est-mCnc 163     Assessment & Plan     1. Diabetes mellitus without complication (HCC) L9D is rising on maximum dose of metformin. He would like to try Trulicity or Ozempic, but he want to get all of his medications at the same time, so will send in prescription the first week of May.  He also doesn't feel like the gabapentin is working very well. He only take at night because it makes him sleepy. Will send in increase in dose of 2 x 334m the first week of may.   2. Morbid obesity ([place] Advised to stop drinking [place] drink mainly water. May see some weight loss with Trulicity.   3. OSA on CPAP Just started CPAP this week and seems to be less sleepy during the daytime.    Future Appointments  Date Time Provider DCalhoun 07/11/2020 11:00 AM [facility] CASE MANAGER [facility] PEC  07/28/2020  3:15 PM KRalene Bathe MD [facility] None  11/07/2020  8:00 AM FBirdie Sons MD [facility] PEC  11/18/2020 10:30 AM [facility] VASC 1 [facility]IMG None  11/18/2020 11:30 AM Dew, JErskine Squibb MD [facility] None  04/16/2021  9:40 AM BFP-NURSE HEALTH ADVISOR [facility] PEC            DLelon Huh MD  [phone](phone) [phone](fax)  [place]

## 2020-07-01 NOTE — Patient Instructions (Signed)
.   I'll send in a prescription for 2 capsules of gabapentin every night the first week of May   I'll send in a prescription for Trulicity, a once a week injection for diabetes and to help lose weight the first week of May. If your insurance doesn't cover Trulicity we can change to Ozempic    You should take a full dose of powdered metamucil every evening to help with constipation, which might otherwise get worse when you start Trulicity.

## 2020-07-09 ENCOUNTER — Encounter: Payer: Self-pay | Admitting: Family Medicine

## 2020-07-09 DIAGNOSIS — G4733 Obstructive sleep apnea (adult) (pediatric): Secondary | ICD-10-CM | POA: Diagnosis not present

## 2020-07-09 DIAGNOSIS — J310 Chronic rhinitis: Secondary | ICD-10-CM | POA: Diagnosis not present

## 2020-07-09 DIAGNOSIS — J439 Emphysema, unspecified: Secondary | ICD-10-CM | POA: Diagnosis not present

## 2020-07-11 ENCOUNTER — Ambulatory Visit (INDEPENDENT_AMBULATORY_CARE_PROVIDER_SITE_OTHER): Payer: Medicare Other

## 2020-07-11 DIAGNOSIS — E119 Type 2 diabetes mellitus without complications: Secondary | ICD-10-CM | POA: Diagnosis not present

## 2020-07-11 DIAGNOSIS — G4733 Obstructive sleep apnea (adult) (pediatric): Secondary | ICD-10-CM

## 2020-07-11 DIAGNOSIS — E1165 Type 2 diabetes mellitus with hyperglycemia: Secondary | ICD-10-CM | POA: Diagnosis not present

## 2020-07-11 DIAGNOSIS — J439 Emphysema, unspecified: Secondary | ICD-10-CM

## 2020-07-11 NOTE — Chronic Care Management (AMB) (Signed)
Chronic Care Management   Initial Visit Note  07/11/2020 Name: Ronald Malone MRN: [id number] DOB: 16-Oct-1955  Primary Care Provider: Birdie Sons, MD Reason for referral : Chronic Care Management   Ronald Malone is a 65 y.o. year old male who is a primary care patient of Fisher, Ronald Peri, MD. The CCM team was consulted for assistance with chronic disease management and care coordination.   Review of Ronald Malone' status, including review of consultants reports, relevant labs and test results was conducted today. Collaboration with appropriate care team members was performed as part of the comprehensive evaluation and provision of chronic care management services.    SDOH (Social Determinants of Health) assessments performed: Yes See Care Plan activities for detailed interventions related to SDOH  SDOH Interventions   Flowsheet Row Most Recent Value  SDOH Interventions   Food Insecurity Interventions Intervention Not Indicated  Transportation Interventions Intervention Not Indicated       Medications: Outpatient Encounter Medications as of 07/11/2020  Medication Sig Note  . ACCU-CHEK GUIDE test strip USE ONCE DAILY IN MORNING (E11.9)   . apixaban (ELIQUIS) 2.5 MG TABS tablet Take 1 tablet (2.5 mg total) by mouth 2 (two) times daily.   Marland Kitchen atorvastatin (LIPITOR) 20 MG tablet Take 20 mg by mouth daily.   . blood glucose meter kit and supplies Dispense based on patient and insurance preference. Use once daily in the morning. (FOR ICD-10 E11.9).   . COMBIVENT RESPIMAT 20-100 MCG/ACT AERS respimat Inhale 1 puff into the lungs every 6 (six) hours as needed. 04/10/2020: Has not started  . gabapentin (NEURONTIN) 300 MG capsule Take 1 capsule (300 mg total) by mouth at bedtime.   Marland Kitchen latanoprost (XALATAN) 0.005 % ophthalmic solution Place 1 drop into both eyes at bedtime.   . metFORMIN (GLUCOPHAGE XR) 500 MG 24 hr tablet Take 2 tablets (1,000 mg total) by mouth 2 (two) times daily.   .  sildenafil (VIAGRA) 50 MG tablet Take 1 tablet (50 mg total) by mouth daily as needed for erectile dysfunction.    No facility-administered encounter medications on file as of 07/11/2020.     Objective:  Patient Care Plan: Diabetes Type 2 (Adult)    Problem Identified: Glycemic Management (Diabetes, Type 2)     Long-Range Goal: Glycemic Management Optimized   Start Date: 07/11/2020  Expected End Date: 11/08/2020  Priority: High  Note:   Objective:  Lab Results  Component Value Date   HGBA1C 7.3 (A) 07/01/2020 .   Lab Results  Component Value Date   CREATININE 0.90 05/19/2020   CREATININE 0.97 02/19/2020   CREATININE 0.96 02/19/2020 .   Marland Kitchen No results found for: EGFR   Current Barriers:  . Chronic disease management support and educational needs related to Diabetes self-management.   Clinical Goal(s):  Marland Kitchen Over the next 120 days, patient will demonstrate improved adherence to prescribed treatment plan for Diabetes management as evidenced by taking medications as prescribed, monitoring and recording CBGs, and adherence to an ADA/ carb modified diet.   Interventions:  . Collaboration with Birdie Sons, MD regarding development and update of comprehensive plan of care as evidenced by provider attestation and co-signature . Inter-disciplinary care team collaboration (see longitudinal plan of care) . Reviewed medications. Advised to take all medications as prescribed. Encouraged to notify the care management team with concerns regarding medication management or prescription cost. . Provided information regarding the importance of consistent blood glucose monitoring. Reviewed s/sx of hypoglycemia and hyperglycemia along  with recommended interventions. Reports not monitoring blood glucose levels regularly. Discussed recent A1C increase from 6.9 to 7.3%. Advised to monitor blood glucose levels and maintain a log. . Discussed and offered referrals for available Diabetes education and  nutrition classes. Reports taking medications but not following a diabetic diet. Current A1C is not at goal. Declined need for additional diabetes education or resources. He feels his A1C will improve when he starts Trulicity next month. Will reconsider if his A1C continues to increase. . Discussed importance of completing recommended DM preventive care. Advised to complete routine footcare and annual eye exams as recommended.   Patient Goals/Self-Care Activities -Self-administer medications as prescribed -Attend all scheduled provider appointments -Monitor blood glucose levels consistently and utilize recommended interventions -Adhere to prescribed ADA/carb modified -Notify provider or care management team with questions and new concerns as needed   Follow Up Plan:  Will follow up in 90 days   Patient Care Plan: Pulmonary Emphysema    Problem Identified: Symptom Exacerbation (Pulmonary Emphysema)     Long-Range Goal: Symptom Exacerbation Prevented or Minimized   Start Date: 07/11/2020  Expected End Date: 11/08/2020  Priority: High  Note:   Current Barriers:  . Chronic Disease Management support and educational needs r/t Pulmonary Emphysema  Case Manager Clinical Goal(s): Marland Kitchen Over the next 120 days, patient will not require hospitalization for complications r/t Pulmonary Emphysema.   Interventions:  . Collaboration with Birdie Sons, MD regarding development and update of comprehensive plan of care as evidenced by provider attestation and co-signature . Inter-disciplinary care team collaboration (see longitudinal plan of care) . Reviewed medications. Reports compliance with prescribed inhalers. Encouraged to continue taking medications and utilizing inhalers as prescribed. Encouraged to notify the care management team with concerns regarding prescription cost. . Discussed current action plan and importance of daily self-assessment. Provided information regarding exacerbation  prevention. Notes an occasional non-productive cough along with congestion. Notes these symptoms usually occur in the morning and tend to clear during up during the day. Overall feels symptoms are well controlled with the prescribed inhaler. . Reviewed worsening symptoms that require immediate medical attention. Discussed increased risk for complications d/t smoking daily. Reports currently smoking approximately two packs a day. Declined referral for smoking cessation counseling. Declined resources for [place] Quit. Reports he is not interested in quitting at this time. . Provided information regarding infection prevention and increased risk d/t Pulmonary Emphysema. Advised to utilize prevention strategies to reduce risk of respiratory infections.   Self-Care Activities/Patient Goals: -Take medications and utilize inhalers as prescribed -Assess symptoms daily  -Follow recommendations to prevent respiratory infection -Notify provider or care management team with questions and new concerns as needed    Follow Up Plan:  Will follow up in 90 days       Mr. Barco was given information about Chronic Care Management services including:  1. CCM service includes personalized support from designated clinical staff supervised by his physician, including individualized plan of care and coordination with other care providers 2. 24/7 contact phone numbers for assistance for urgent and routine care needs. 3. Service will only be billed when office clinical staff spend 20 minutes or more in a month to coordinate care. 4. Only one practitioner may furnish and bill the service in a calendar month. 5. The patient may stop CCM services at any time (effective at the end of the month) by phone call to the office staff. 6. The patient will be responsible for cost sharing (co-pay) of up to 20%  of the service fee (after annual deductible is met).  Patient agreed to services and verbal consent obtained.        PLAN A member of the care management team will follow up with Mr. Nazir  within 90 days.     [facility] [phone]

## 2020-07-14 ENCOUNTER — Other Ambulatory Visit: Payer: Self-pay | Admitting: Family Medicine

## 2020-07-14 DIAGNOSIS — E119 Type 2 diabetes mellitus without complications: Secondary | ICD-10-CM

## 2020-07-14 DIAGNOSIS — G6289 Other specified polyneuropathies: Secondary | ICD-10-CM

## 2020-07-14 DIAGNOSIS — E1165 Type 2 diabetes mellitus with hyperglycemia: Secondary | ICD-10-CM

## 2020-07-14 MED ORDER — METFORMIN HCL ER 500 MG PO TB24: 1000.0000 mg | ORAL_TABLET | Freq: Two times a day (BID) | ORAL | 4 refills | Status: DC

## 2020-07-14 MED ORDER — GABAPENTIN 300 MG PO CAPS: 300.0000 mg | ORAL_CAPSULE | Freq: Every day | ORAL | 4 refills | Status: DC

## 2020-07-14 MED ORDER — TRULICITY 0.75 MG/0.5ML ~~LOC~~ SOAJ: 0.7500 mg | SUBCUTANEOUS | 0 refills | Status: DC

## 2020-07-15 DIAGNOSIS — G4733 Obstructive sleep apnea (adult) (pediatric): Secondary | ICD-10-CM | POA: Diagnosis not present

## 2020-07-18 ENCOUNTER — Encounter: Payer: Self-pay | Admitting: Family Medicine

## 2020-07-18 DIAGNOSIS — R5382 Chronic fatigue, unspecified: Secondary | ICD-10-CM | POA: Diagnosis not present

## 2020-07-18 DIAGNOSIS — G4733 Obstructive sleep apnea (adult) (pediatric): Secondary | ICD-10-CM | POA: Diagnosis not present

## 2020-07-21 ENCOUNTER — Ambulatory Visit: Payer: Self-pay

## 2020-07-21 DIAGNOSIS — E1165 Type 2 diabetes mellitus with hyperglycemia: Secondary | ICD-10-CM

## 2020-07-21 DIAGNOSIS — J439 Emphysema, unspecified: Secondary | ICD-10-CM

## 2020-07-21 NOTE — Chronic Care Management (AMB) (Signed)
Error. Please disregard

## 2020-07-21 NOTE — Chronic Care Management (AMB) (Signed)
  Chronic Care Management   Note  07/21/2020 Name: Ronald Malone MRN: [id number] DOB: 17-Apr-1955    Call received from [name] regarding prescription cost.  He is requesting assistance with Trulicity.   PLAN: Referral submitted to [place] for medication assistance.   [facility] Management [facility] [phone]

## 2020-07-22 NOTE — Patient Instructions (Signed)
Thank you for allowing the Chronic Care Management team to participate in your care. It was a pleasure speaking with you. Please feel free to contact me with questions.   Goals Addressed: Patient Care Plan: Diabetes Type 2 (Adult)    Problem Identified: Glycemic Management (Diabetes, Type 2)     Long-Range Goal: Glycemic Management Optimized   Start Date: 07/11/2020  Expected End Date: 11/08/2020  Priority: High  Note:   Objective:  Lab Results  Component Value Date   HGBA1C 7.3 (A) 07/01/2020 .   Lab Results  Component Value Date   CREATININE 0.90 05/19/2020   CREATININE 0.97 02/19/2020   CREATININE 0.96 02/19/2020 .   Marland Kitchen No results found for: EGFR   Current Barriers:  . Chronic disease management support and educational needs related to Diabetes self-management.   Clinical Goal(s):  Marland Kitchen Over the next 120 days, patient will demonstrate improved adherence to prescribed treatment plan for Diabetes management as evidenced by taking medications as prescribed, monitoring and recording CBGs, and adherence to an ADA/ carb modified diet.   Interventions:  . Collaboration with Birdie Sons, MD regarding development and update of comprehensive plan of care as evidenced by provider attestation and co-signature . Inter-disciplinary care team collaboration (see longitudinal plan of care) . Reviewed medications. Advised to take all medications as prescribed. Encouraged to notify the care management team with concerns regarding medication management or prescription cost. . Provided information regarding the importance of consistent blood glucose monitoring. Reviewed s/sx of hypoglycemia and hyperglycemia along with recommended interventions. Reports not monitoring blood glucose levels regularly. Discussed recent A1C increase from 6.9 to 7.3%. Advised to monitor blood glucose levels and maintain a log. . Discussed and offered referrals for available Diabetes education and nutrition classes.  Reports taking medications but not following a diabetic diet. Current A1C is not at goal. Declined need for additional diabetes education or resources. He feels his A1C will improve when he starts Trulicity next month. Will reconsider if his A1C continues to increase. . Discussed importance of completing recommended DM preventive care. Advised to complete routine footcare and annual eye exams as recommended.   Patient Goals/Self-Care Activities -Self-administer medications as prescribed -Attend all scheduled provider appointments -Monitor blood glucose levels consistently and utilize recommended interventions -Adhere to prescribed ADA/carb modified -Notify provider or care management team with questions and new concerns as needed   Follow Up Plan:  Will follow up in 90 days       Patient Care Plan: Pulmonary Emphysema    Problem Identified: Symptom Exacerbation (Pulmonary Emphysema)     Long-Range Goal: Symptom Exacerbation Prevented or Minimized   Start Date: 07/11/2020  Expected End Date: 11/08/2020  Priority: High  Note:   Current Barriers:  . Chronic Disease Management support and educational needs r/t Pulmonary Emphysema  Case Manager Clinical Goal(s): Marland Kitchen Over the next 120 days, patient will not require hospitalization for complications r/t Pulmonary Emphysema.   Interventions:  . Collaboration with Birdie Sons, MD regarding development and update of comprehensive plan of care as evidenced by provider attestation and co-signature . Inter-disciplinary care team collaboration (see longitudinal plan of care) . Reviewed medications. Reports compliance with prescribed inhalers. Encouraged to continue taking medications and utilizing inhalers as prescribed. Encouraged to notify the care management team with concerns regarding prescription cost. . Discussed current action plan and importance of daily self-assessment. Provided information regarding exacerbation prevention. Notes  an occasional non-productive cough along with congestion. Notes these symptoms usually occur in the  morning and tend to clear during up during the day. Overall feels symptoms are well controlled with the prescribed inhaler. . Reviewed worsening symptoms that require immediate medical attention. Discussed increased risk for complications d/t smoking daily. Reports currently smoking approximately two packs a day. Declined referral for smoking cessation counseling. Declined resources for [place] Quit. Reports he is not interested in quitting at this time. . Provided information regarding infection prevention and increased risk d/t Pulmonary Emphysema. Advised to utilize prevention strategies to reduce risk of respiratory infections.   Self-Care Activities/Patient Goals: -Take medications and utilize inhalers as prescribed -Assess symptoms daily  -Follow recommendations to prevent respiratory infection -Notify provider or care management team with questions and new concerns as needed    Follow Up Plan:  Will follow up in 90 days       Mr. Scantlebury was given information about Chronic Care Management services including:  1. CCM service includes personalized support from designated clinical staff supervised by his physician, including individualized plan of care and coordination with other care providers 2. 24/7 contact phone numbers for assistance for urgent and routine care needs. 3. Service will only be billed when office clinical staff spend 20 minutes or more in a month to coordinate care. 4. Only one practitioner may furnish and bill the service in a calendar month. 5. The patient may stop CCM services at any time (effective at the end of the month) by phone call to the office staff. 6. The patient will be responsible for cost sharing (co-pay) of up to 20% of the service fee (after annual deductible is met).  Patient agreed to services and verbal consent obtained.        Mr. Hochmuth  verbalized understanding of the information discussed during the telephonic outreach today. Declined need for mailed/printed instructions. A member of the care management team will follow up with Mr. Mousel within 90 days.     [facility] [phone]

## 2020-07-23 ENCOUNTER — Telehealth: Payer: Self-pay | Admitting: *Deleted

## 2020-07-23 NOTE — Chronic Care Management (AMB) (Signed)
  Chronic Care Management   Note  07/23/2020 Name: NGOC DAUGHTRIDGE MRN: [id number] DOB: Jun 15, 1955  DERREK PUFF is a 65 y.o. year old male who is a primary care patient of Caryn Section, Kirstie Peri, MD. MARTE CELANI is currently enrolled in care management services. An additional referral for Pharmacy was placed.   Follow up plan: Patient declines engagement with the care management team Pharmacist . Appropriate care team members and provider have been notified via electronic communication. The patient has been provided with contact information for the care management team and has been advised to call with any health related questions or concerns.   [place] Management  Direct Dial: [phone]

## 2020-07-24 ENCOUNTER — Telehealth: Payer: Self-pay

## 2020-07-24 NOTE — Chronic Care Management (AMB) (Signed)
    Chronic Care Management Pharmacy Assistant   Name: Ronald Malone  MRN: [id number] DOB: 10/11/1955  Reason for Encounter: Patient Assistance Coordination   07/24/2020- Patient assistance application filled out for Trulicity with [employer] patient assistance program. Called patient to inform, patient aware and would like application emailed to him to [email or web address]. Patient states his insurance is not covering medication fully and copay is $642, he is unable to afford medication. Patient aware that he will need to fill out highlighted areas, print, gather income information and bring back to PCP office on either Monday, Tuesday or Friday for Dr Caryn Section to sign and Junius Argyle, CPP to fax.   Medications: Outpatient Encounter Medications as of 07/24/2020  Medication Sig Note  . ACCU-CHEK GUIDE test strip USE ONCE DAILY IN MORNING (E11.9)   . apixaban (ELIQUIS) 2.5 MG TABS tablet Take 1 tablet (2.5 mg total) by mouth 2 (two) times daily.   Marland Kitchen atorvastatin (LIPITOR) 20 MG tablet Take 20 mg by mouth daily.   . blood glucose meter kit and supplies Dispense based on patient and insurance preference. Use once daily in the morning. (FOR ICD-10 E11.9).   . COMBIVENT RESPIMAT 20-100 MCG/ACT AERS respimat Inhale 1 puff into the lungs every 6 (six) hours as needed. 04/10/2020: Has not started  . Dulaglutide (TRULICITY) 4.91 PH/1.5AV SOPN Inject 0.75 mg into the skin once a week.   . gabapentin (NEURONTIN) 300 MG capsule Take 1-2 capsules (300-600 mg total) by mouth at bedtime.   Marland Kitchen latanoprost (XALATAN) 0.005 % ophthalmic solution Place 1 drop into both eyes at bedtime.   . metFORMIN (GLUCOPHAGE XR) 500 MG 24 hr tablet Take 2 tablets (1,000 mg total) by mouth 2 (two) times daily.   . sildenafil (VIAGRA) 50 MG tablet Take 1 tablet (50 mg total) by mouth daily as needed for erectile dysfunction.    No facility-administered encounter medications on file as of 07/24/2020.    Star Rating  Drugs: Atorvastatin 20 mg- Last filled [facility]/14/2020 [facility] Trulicity 6.97 mg- Last filled 07/15/2020 for [facility] day supply at [facility] Metformin 500 mg- Last filled 07/14/2020 [facility]   SIG: Pattricia Boss, [place]

## 2020-07-28 ENCOUNTER — Other Ambulatory Visit: Payer: Self-pay

## 2020-07-28 ENCOUNTER — Ambulatory Visit: Payer: Medicare Other | Admitting: Dermatology

## 2020-07-28 DIAGNOSIS — L820 Inflamed seborrheic keratosis: Secondary | ICD-10-CM | POA: Diagnosis not present

## 2020-07-28 DIAGNOSIS — I872 Venous insufficiency (chronic) (peripheral): Secondary | ICD-10-CM | POA: Diagnosis not present

## 2020-07-28 DIAGNOSIS — L817 Pigmented purpuric dermatosis: Secondary | ICD-10-CM

## 2020-07-28 MED ORDER — TRIAMCINOLONE ACETONIDE 0.1 % EX CREA: 1.0000 "application " | TOPICAL_CREAM | Freq: Every day | CUTANEOUS | 3 refills | Status: DC

## 2020-07-28 NOTE — Patient Instructions (Addendum)

## 2020-07-28 NOTE — Progress Notes (Signed)
   New Patient Visit  Subjective  Ronald Malone is a 65 y.o. male who presents for the following: Rash (Lower legs - History of neuropathy and DVT).  Referred by Dr. Caryn Section  The following portions of the chart were reviewed this encounter and updated as appropriate:   Tobacco  Allergies  Meds  Problems  Med Hx  Surg Hx  Fam Hx     Review of Systems:  No other skin or systemic complaints except as noted in HPI or Assessment and Plan.  Objective  Well appearing patient in no apparent distress; mood and affect are within normal limits.  A focused examination was performed including scalp, legs. Relevant physical exam findings are noted in the Assessment and Plan.  Objective  Bilateral lower legs: Varicose veins with woody induration  Images          Objective  Crown scalp: Erythematous keratotic or waxy stuck-on papule or plaque.    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 Discussed graduated compression socks (knee high).   May consider evaluation at lymphedema clinic.  Start TMC 0.1% cream qd 5 days per week. Avoid face, groin, underarms  Stasis in the legs causes chronic leg swelling, which may result in itchy or painful rashes, skin discoloration, skin texture changes, and sometimes ulceration.  Recommend daily compression hose/stockings- easiest to put on first thing in morning, remove at bedtime.  Elevate legs as much as possible. Avoid salt/sodium rich foods.  triamcinolone cream (KENALOG) 0.1 % - Bilateral lower legs  Inflamed seborrheic keratosis Crown scalp Destruction of lesion - Crown scalp Complexity: simple   Destruction method: cryotherapy   Informed consent: discussed and consent obtained   Timeout:  patient name, date of birth, surgical site, and procedure  verified Lesion destroyed using liquid nitrogen: Yes   Region frozen until ice ball extended beyond lesion: Yes   Outcome: patient tolerated procedure well with no complications   Post-procedure details: wound care instructions given    Return in about 2 months (around 09/27/2020).  I, Ashok Cordia, CMA, am acting as scribe for Sarina Ser, MD .  Documentation: I have reviewed the above documentation for accuracy and completeness, and I agree with the above.  Sarina Ser, MD

## 2020-08-01 ENCOUNTER — Telehealth: Payer: Self-pay | Admitting: *Deleted

## 2020-08-01 NOTE — Chronic Care Management (AMB) (Signed)
  Chronic Care Management   Outreach Note  08/01/2020 Name: Ronald Malone MRN: [id number] DOB: May 03, 1955  Ronald Malone is a 65 y.o. year old male who is a primary care patient of Caryn Section, Kirstie Peri, MD. I reached out to [name] by phone today in response to a referral sent by [name]'s PCP, Dr. Caryn Section.     An unsuccessful telephone outreach was attempted today. The patient was referred to the case management team for assistance with care management and care coordination.   Follow Up Plan: A HIPAA compliant phone message was left for the patient providing contact information and requesting a return call. The care management team will reach out to the patient again over the next 7 days. If patient returns call to provider office, please advise to call [place] at [phone].  [place] Management

## 2020-08-05 ENCOUNTER — Encounter: Payer: Self-pay | Admitting: Dermatology

## 2020-08-06 ENCOUNTER — Telehealth: Payer: Self-pay | Admitting: *Deleted

## 2020-08-06 DIAGNOSIS — Z87891 Personal history of nicotine dependence: Secondary | ICD-10-CM

## 2020-08-06 DIAGNOSIS — Z122 Encounter for screening for malignant neoplasm of respiratory organs: Secondary | ICD-10-CM

## 2020-08-06 DIAGNOSIS — F17200 Nicotine dependence, unspecified, uncomplicated: Secondary | ICD-10-CM

## 2020-08-06 NOTE — Telephone Encounter (Signed)
Received referral for initial lung cancer screening scan. Contacted patient and obtained smoking history,(current 45 pack year) as well as answering questions related to screening process. Patient denies signs of lung cancer such as weight loss or hemoptysis. Patient denies comorbidity that would prevent curative treatment if lung cancer were found. Patient is scheduled for CT scan on 08/14/20 at 1030am. SDMV done by Dr. Raul Del on 07/11/20 .

## 2020-08-08 NOTE — Chronic Care Management (AMB) (Signed)
  Chronic Care Management   Note  08/08/2020 Name: Ronald Malone MRN: [id number] DOB: 18-Dec-1955  Ronald Malone is a 65 y.o. year old male who is a primary care patient of Caryn Section, Kirstie Peri, MD. Ronald Malone is currently enrolled in care management services. An additional referral for Pharmacy was placed.   Follow up plan: Patient declines engagement by the care management team. Appropriate care team members and provider have been notified via electronic communication.   [place] Management

## 2020-08-12 NOTE — Telephone Encounter (Cosign Needed)
Thank you for letting us know.  

## 2020-08-14 ENCOUNTER — Other Ambulatory Visit: Payer: Self-pay

## 2020-08-14 ENCOUNTER — Ambulatory Visit
Admission: RE | Admit: 2020-08-14 | Discharge: 2020-08-14 | Disposition: A | Discharge status: Home / Self Care | Payer: Medicare Other | Source: Ambulatory Visit | Attending: Oncology | Admitting: Oncology

## 2020-08-14 DIAGNOSIS — Z122 Encounter for screening for malignant neoplasm of respiratory organs: Secondary | ICD-10-CM | POA: Diagnosis not present

## 2020-08-14 DIAGNOSIS — F17200 Nicotine dependence, unspecified, uncomplicated: Secondary | ICD-10-CM | POA: Diagnosis not present

## 2020-08-14 DIAGNOSIS — Z87891 Personal history of nicotine dependence: Secondary | ICD-10-CM | POA: Diagnosis not present

## 2020-08-18 DIAGNOSIS — R5382 Chronic fatigue, unspecified: Secondary | ICD-10-CM | POA: Diagnosis not present

## 2020-08-18 DIAGNOSIS — G4733 Obstructive sleep apnea (adult) (pediatric): Secondary | ICD-10-CM | POA: Diagnosis not present

## 2020-08-20 ENCOUNTER — Encounter: Payer: Self-pay | Admitting: *Deleted

## 2020-08-20 ENCOUNTER — Encounter: Payer: Self-pay | Admitting: Family Medicine

## 2020-08-21 ENCOUNTER — Encounter: Payer: Self-pay | Admitting: Family Medicine

## 2020-08-21 DIAGNOSIS — K760 Fatty (change of) liver, not elsewhere classified: Secondary | ICD-10-CM | POA: Insufficient documentation

## 2020-08-21 DIAGNOSIS — Z86010 Personal history of colonic polyps: Secondary | ICD-10-CM | POA: Insufficient documentation

## 2020-09-11 DIAGNOSIS — G4733 Obstructive sleep apnea (adult) (pediatric): Secondary | ICD-10-CM | POA: Diagnosis not present

## 2020-09-11 DIAGNOSIS — Z86711 Personal history of pulmonary embolism: Secondary | ICD-10-CM | POA: Diagnosis not present

## 2020-09-11 DIAGNOSIS — Z87891 Personal history of nicotine dependence: Secondary | ICD-10-CM | POA: Diagnosis not present

## 2020-09-11 DIAGNOSIS — I739 Peripheral vascular disease, unspecified: Secondary | ICD-10-CM | POA: Diagnosis not present

## 2020-09-11 DIAGNOSIS — E119 Type 2 diabetes mellitus without complications: Secondary | ICD-10-CM | POA: Diagnosis not present

## 2020-09-11 DIAGNOSIS — E7800 Pure hypercholesterolemia, unspecified: Secondary | ICD-10-CM | POA: Diagnosis not present

## 2020-09-11 DIAGNOSIS — I25118 Atherosclerotic heart disease of native coronary artery with other forms of angina pectoris: Secondary | ICD-10-CM | POA: Diagnosis not present

## 2020-09-11 DIAGNOSIS — R0609 Other forms of dyspnea: Secondary | ICD-10-CM | POA: Diagnosis not present

## 2020-09-11 DIAGNOSIS — J439 Emphysema, unspecified: Secondary | ICD-10-CM | POA: Diagnosis not present

## 2020-09-11 DIAGNOSIS — Z86718 Personal history of other venous thrombosis and embolism: Secondary | ICD-10-CM | POA: Diagnosis not present

## 2020-09-17 DIAGNOSIS — R5382 Chronic fatigue, unspecified: Secondary | ICD-10-CM | POA: Diagnosis not present

## 2020-09-17 DIAGNOSIS — G4733 Obstructive sleep apnea (adult) (pediatric): Secondary | ICD-10-CM | POA: Diagnosis not present

## 2020-09-30 ENCOUNTER — Telehealth: Payer: Self-pay | Admitting: Family Medicine

## 2020-09-30 DIAGNOSIS — I82431 Acute embolism and thrombosis of right popliteal vein: Secondary | ICD-10-CM

## 2020-09-30 MED ORDER — APIXABAN 2.5 MG PO TABS: 2.5000 mg | ORAL_TABLET | Freq: Two times a day (BID) | ORAL | 1 refills | Status: DC

## 2020-09-30 NOTE — Telephone Encounter (Signed)
[facility] for the following medications:  apixaban (ELIQUIS) 2.5 MG TABS tablet  Last Rx: 03/18/20 Qty: 180 Refills: 1 NOV: 11/07/20 Please advise. Thanks TNP

## 2020-10-07 ENCOUNTER — Other Ambulatory Visit: Payer: Self-pay

## 2020-10-07 ENCOUNTER — Ambulatory Visit: Payer: Medicare Other | Admitting: Dermatology

## 2020-10-07 DIAGNOSIS — L817 Pigmented purpuric dermatosis: Secondary | ICD-10-CM | POA: Diagnosis not present

## 2020-10-07 DIAGNOSIS — I8393 Asymptomatic varicose veins of bilateral lower extremities: Secondary | ICD-10-CM

## 2020-10-07 DIAGNOSIS — I872 Venous insufficiency (chronic) (peripheral): Secondary | ICD-10-CM

## 2020-10-07 MED ORDER — TRIAMCINOLONE ACETONIDE 0.1 % EX CREA: TOPICAL_CREAM | CUTANEOUS | 1 refills | Status: DC

## 2020-10-07 NOTE — Progress Notes (Signed)
   Follow-Up Visit   Subjective  Ronald Malone is a 65 y.o. male who presents for the following: stasis dermatitis (Patient has noticed an improvement since starting [place] 0.1%, but states that his wife and sister don't think condition has improved. ) and ISK (Recheck the crown/scalp - patient states that lesion has resolved with LN2 tx.). He has not been using compression stockings as recommended.  The following portions of the chart were reviewed this encounter and updated as appropriate:   Tobacco  Allergies  Meds  Problems  Med Hx  Surg Hx  Fam Hx     Review of Systems:  No other skin or systemic complaints except as noted in HPI or Assessment and Plan.  Objective  Well appearing patient in no apparent distress; mood and affect are within normal limits.  A focused examination was performed including the B/L lower leg. Relevant physical exam findings are noted in the Assessment and Plan.  B/L lower leg Varicose veins with woody induration.    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 The patient has not been compliant with graduated compression stocking use. Photos shows some improvement but not to goal Advised patient condition is chronic and can be improved but not cured. Discussed graduated compression socks (knee high). [place] pamphlet given today.   May consider evaluation at lymphedema clinic if patient is not adequately improved after regular use of graduated compression stocking use.   Continue TMC 0.1% cream qd 5 days per week to inflamed discolored areas. Avoid face, groin, underarms. Topical steroids (such as triamcinolone, fluocinolone, fluocinonide, mometasone, clobetasol, halobetasol, betamethasone, hydrocortisone) can cause thinning and lightening of the skin if they are used for too long in the same area. Your physician has selected the  right strength medicine for your problem and area affected on the body. Please use your medication only as directed by your physician to prevent side effects.   Stasis in the legs causes chronic leg swelling, which may result in itchy or painful rashes, skin discoloration, skin texture changes, and sometimes ulceration.  Recommend daily compression hose/stockings- easiest to put on first thing in morning, remove at bedtime.  Elevate legs as much as possible. Avoid salt/sodium rich foods.  triamcinolone cream (KENALOG) 0.1 % - B/L lower leg Apply to aa's lower legs QD up to 5 days per week.  Return in about 3 months (around 01/07/2021) for stasis dermatitis follow up .  Luther Redo, CMA, am acting as scribe for Sarina Ser, MD . Documentation: I have reviewed the above documentation for accuracy and completeness, and I agree with the above.  Sarina Ser, MD

## 2020-10-07 NOTE — Patient Instructions (Signed)

## 2020-10-08 ENCOUNTER — Encounter: Payer: Self-pay | Admitting: Dermatology

## 2020-10-08 DIAGNOSIS — H40003 Preglaucoma, unspecified, bilateral: Secondary | ICD-10-CM | POA: Diagnosis not present

## 2020-10-18 DIAGNOSIS — G4733 Obstructive sleep apnea (adult) (pediatric): Secondary | ICD-10-CM | POA: Diagnosis not present

## 2020-10-18 DIAGNOSIS — R5382 Chronic fatigue, unspecified: Secondary | ICD-10-CM | POA: Diagnosis not present

## 2020-10-24 ENCOUNTER — Telehealth: Payer: Self-pay

## 2020-10-24 NOTE — Telephone Encounter (Signed)
  Care Management   Follow Up Note   10/24/2020 Name: Ronald Malone MRN: [id number] DOB: 10-30-1955   Primary Care Provider: Birdie Sons, MD Reason for referral : Chronic Care Management   An unsuccessful telephone outreach was attempted today. The patient was referred to the case management team for assistance with care management and care coordination.    Follow Up Plan:  A member of the care management team will attempt to reach [name] within the next two weeks.    [facility] Care Management [facility] [phone]

## 2020-10-28 ENCOUNTER — Encounter: Payer: Self-pay | Admitting: Family Medicine

## 2020-10-28 DIAGNOSIS — H40003 Preglaucoma, unspecified, bilateral: Secondary | ICD-10-CM | POA: Diagnosis not present

## 2020-11-04 ENCOUNTER — Other Ambulatory Visit: Payer: Self-pay

## 2020-11-04 ENCOUNTER — Ambulatory Visit (INDEPENDENT_AMBULATORY_CARE_PROVIDER_SITE_OTHER): Payer: Medicare Other | Admitting: Family Medicine

## 2020-11-04 ENCOUNTER — Encounter: Payer: Self-pay | Admitting: Family Medicine

## 2020-11-04 VITALS — BP 150/80 | HR 69 | Wt 317.0 lb

## 2020-11-04 DIAGNOSIS — E1169 Type 2 diabetes mellitus with other specified complication: Secondary | ICD-10-CM | POA: Diagnosis not present

## 2020-11-04 DIAGNOSIS — E785 Hyperlipidemia, unspecified: Secondary | ICD-10-CM

## 2020-11-04 DIAGNOSIS — E1142 Type 2 diabetes mellitus with diabetic polyneuropathy: Secondary | ICD-10-CM

## 2020-11-04 LAB — POCT GLYCOSYLATED HEMOGLOBIN (HGB A1C): Hemoglobin A1C: 6 % — AB (ref 4.0–5.6)

## 2020-11-04 NOTE — Progress Notes (Signed)
Established patient visit   Patient: Ronald Malone   DOB: 02-Apr-1955   65 y.o. Male  MRN: [id number] Visit Date: 11/04/2020  Today's healthcare provider: Lelon Huh, MD   Chief Complaint  Patient presents with   Diabetes   Hyperlipidemia   Subjective  -------------------------------------------------------------------------------------------------------------------- HPI  Diabetes Mellitus Type II, Follow-up  Lab Results  Component Value Date   HGBA1C 7.3 (A) 07/01/2020   HGBA1C 6.9 (A) 02/19/2020   HGBA1C 6.7 (A) 08/17/2019   Wt Readings from Last 3 Encounters:  11/04/20 (!) 317 lb (143.8 kg)  08/14/20 (!) 325 lb (147.4 kg)  07/01/20 (!) 341 lb (154.7 kg)   Last seen for diabetes 4 months ago.  Management since then includes advising to start Trulicity or Ozempic.  Pt decline at this time due to cost. He has been much more strict about avoiding sugary drinks lately. He also continues gabapentin every night which has been effective for neuropathy, but Is having some double vision and blurry vision and wondering if that could be side effect. He does report recently had exam [facility].  He is not having side effects.  Symptoms: No fatigue No foot ulcerations  No appetite changes No nausea  Yes paresthesia of the feet  No polydipsia  No polyuria No visual disturbances   No vomiting     Home blood sugar records:  Not being checked  Episodes of hypoglycemia? No    Current insulin regiment: None Most Recent Eye Exam: 01/21/2020 Current exercise: none Current diet habits: in general, a "healthy" diet     --------------------------------------------------------------------------------------------------- Lipid/Cholesterol, Follow-up  Last lipid panel Other pertinent labs  Lab Results  Component Value Date   CHOL 150 02/19/2020   HDL 39 (L) 02/19/2020   LDLCALC 76 02/19/2020   LDLDIRECT 71 10/18/2018   TRIG 207 (H) 02/19/2020   CHOLHDL 3.8  02/19/2020   Lab Results  Component Value Date   ALT 32 02/19/2020   AST 21 02/19/2020   PLT 242 02/19/2020   TSH 0.921 02/19/2020     He was last seen for this 8 months ago.  Management since that visit includes no changes.  He reports excellent compliance with treatment. He is not having side effects.   Symptoms: No chest pain No chest pressure/discomfort  No dyspnea No lower extremity edema  Yes numbness or tingling of extremity No orthopnea  No palpitations No paroxysmal nocturnal dyspnea  No speech difficulty No syncope     The ASCVD Risk score (Bushyhead., et al., 2013) failed to calculate for the following reasons:   The patient has a prior MI or stroke diagnosis  ---------------------------------------------------------------------------------------------------      Medications: Outpatient Medications Prior to Visit  Medication Sig   ACCU-CHEK GUIDE test strip USE ONCE DAILY IN MORNING (E11.9)   apixaban (ELIQUIS) 2.5 MG TABS tablet Take 1 tablet (2.5 mg total) by mouth 2 (two) times daily.   atorvastatin (LIPITOR) 20 MG tablet Take 20 mg by mouth daily.   blood glucose meter kit and supplies Dispense based on patient and insurance preference. Use once daily in the morning. (FOR ICD-10 E11.9).   COMBIVENT RESPIMAT 20-100 MCG/ACT AERS respimat Inhale 1 puff into the lungs every 6 (six) hours as needed.   gabapentin (NEURONTIN) 300 MG capsule Take 1-2 capsules (300-600 mg total) by mouth at bedtime.   latanoprost (XALATAN) 0.005 % ophthalmic solution Place 1 drop into both eyes at bedtime.   metFORMIN (  GLUCOPHAGE XR) 500 MG 24 hr tablet Take 2 tablets (1,000 mg total) by mouth 2 (two) times daily.   sildenafil (VIAGRA) 50 MG tablet Take 1 tablet (50 mg total) by mouth daily as needed for erectile dysfunction.   Dulaglutide (TRULICITY) 0.98 JX/9.1YN SOPN Inject 0.75 mg into the skin once a week.   triamcinolone cream (KENALOG) 0.1 % Apply 1 application topically  daily. 5 times per week. Avoid face, groin, underarms.   triamcinolone cream (KENALOG) 0.1 % Apply to aa's lower legs QD up to 5 days per week.   No facility-administered medications prior to visit.    Review of Systems  Constitutional: Negative.   Respiratory:  Positive for shortness of breath. Negative for apnea, cough, choking, chest tightness, wheezing and stridor.   Cardiovascular: Negative.   Gastrointestinal: Negative.   Endocrine: Negative.   Skin:  Negative for wound.  Neurological:  Positive for numbness. Negative for dizziness, light-headedness and headaches.      Objective  -------------------------------------------------------------------------------------------------------------------- BP (!) 150/80 (BP Location: Left Arm, Patient Position: Sitting, Cuff Size: Large)   Pulse 69   Wt (!) 317 lb (143.8 kg)   SpO2 96%   BMI 42.99 kg/m     Physical Exam  General appearance: Obese male, cooperative and in no acute distress Head: Normocephalic, without obvious abnormality, atraumatic Respiratory: Respirations even and unlabored, normal respiratory rate Extremities: All extremities are intact.  Skin: Skin color, texture, turgor normal. No rashes seen  Psych: Appropriate mood and affect. Neurologic: Mental status: Alert, oriented to person, place, and time, thought content appropriate.   Results for orders placed or performed in visit on 11/04/20  POCT glycosylated hemoglobin (Hb A1C)  Result Value Ref Range   Hemoglobin A1C 6.0 (A) 4.0 - 5.6 %    Assessment & Plan  ---------------------------------------------------------------------------------------------------------------------- 1. Type 2 diabetes mellitus with diabetic polyneuropathy, without long-term current use of insulin (HCC) Never started Trulicity but doing much better with diet and continues to lose weight with improvement of A1c. He is going to try putting gabapentin on hold due to blurry vision. Can  try OTC b12 and alphalipoic acid.   2. Hyperlipidemia associated with type 2 diabetes mellitus ([place]) He is tolerating atorvastatin well with no adverse effects.    3. Morbid obesity ([place]) Doing better with diet. Continue current medications.     Future Appointments  Date Time Provider [place]  03/06/2021  1:40 PM Caryn Section Kirstie Peri, MD [facility]        The entirety of the information documented in the History of Present Illness, Review of Systems and Physical Exam were personally obtained by me. Portions of this information were initially documented by the CMA and reviewed by me for thoroughness and accuracy.     Lelon Huh, MD  [facility] [phone] (phone) [phone] (fax)  [place]

## 2020-11-04 NOTE — Patient Instructions (Addendum)
Please review the attached list of medications and notify my office if there are any errors.   You can put gabapentin on hold for a few days, up to a week to see if that helps with the blurry vision.  I suggest taking over the counter vitamin B12 1,'000mg'$  once every day which may help with neuropathy.  You c6an also try OTC alpha-lipoic acid 600 three a day to see if it helps with neuropathy. If it's not helping after a month you can stop taking it.

## 2020-11-07 ENCOUNTER — Telehealth: Payer: Self-pay

## 2020-11-07 ENCOUNTER — Ambulatory Visit: Payer: Self-pay | Admitting: Family Medicine

## 2020-11-07 NOTE — Telephone Encounter (Signed)
  Care Management   Follow Up Note   11/07/2020 Name: Ronald Malone MRN: [id number] DOB: [birth year]   Primary Care Provider: Birdie Sons, MD Reason for referral : Chronic Care Management   An unsuccessful telephone outreach was attempted today. The patient was referred to the case management team for assistance with care management and care coordination.     Follow Up Plan:  A HIPAA compliant voice message was left today requesting a return call.    [facility] Care Management [facility] [phone]

## 2020-11-14 ENCOUNTER — Other Ambulatory Visit (INDEPENDENT_AMBULATORY_CARE_PROVIDER_SITE_OTHER): Payer: Self-pay | Admitting: Vascular Surgery

## 2020-11-14 DIAGNOSIS — I739 Peripheral vascular disease, unspecified: Secondary | ICD-10-CM

## 2020-11-18 ENCOUNTER — Other Ambulatory Visit: Payer: Self-pay

## 2020-11-18 ENCOUNTER — Ambulatory Visit (INDEPENDENT_AMBULATORY_CARE_PROVIDER_SITE_OTHER): Payer: Medicare Other | Admitting: Vascular Surgery

## 2020-11-18 ENCOUNTER — Ambulatory Visit (INDEPENDENT_AMBULATORY_CARE_PROVIDER_SITE_OTHER): Payer: Medicare Other

## 2020-11-18 VITALS — BP 159/78 | HR 68 | Ht 72.0 in | Wt 311.0 lb

## 2020-11-18 DIAGNOSIS — I739 Peripheral vascular disease, unspecified: Secondary | ICD-10-CM | POA: Diagnosis not present

## 2020-11-18 DIAGNOSIS — Z87891 Personal history of nicotine dependence: Secondary | ICD-10-CM | POA: Diagnosis not present

## 2020-11-18 DIAGNOSIS — E1169 Type 2 diabetes mellitus with other specified complication: Secondary | ICD-10-CM | POA: Diagnosis not present

## 2020-11-18 DIAGNOSIS — E785 Hyperlipidemia, unspecified: Secondary | ICD-10-CM | POA: Diagnosis not present

## 2020-11-18 DIAGNOSIS — E1165 Type 2 diabetes mellitus with hyperglycemia: Secondary | ICD-10-CM

## 2020-11-18 DIAGNOSIS — G4733 Obstructive sleep apnea (adult) (pediatric): Secondary | ICD-10-CM | POA: Diagnosis not present

## 2020-11-18 DIAGNOSIS — R5382 Chronic fatigue, unspecified: Secondary | ICD-10-CM | POA: Diagnosis not present

## 2020-11-18 NOTE — Assessment & Plan Note (Signed)
ABIs today remain normal at 0.99 on the right and 1.16 on the left with multiphasic waveforms and normal digital pressures.  No role for intervention.  Does still have pain in his hips and buttocks with walking but this may be multifactorial with arthritis and back issues as well.  We will plan to recheck his ABIs in 2 years.

## 2020-11-18 NOTE — Assessment & Plan Note (Signed)
lipid control important in reducing the progression of atherosclerotic disease. Continue statin therapy  

## 2020-11-18 NOTE — Progress Notes (Signed)
MRN : [id number]  Ronald Malone is a 65 y.o. (02-06-1956) male who presents with chief complaint of  Chief Complaint  Patient presents with   Follow-up    Fol 1 yr [place]  .  History of Present Illness: Patient returns today in follow up of his leg pain and PAD.  He still gets hip and buttock claudication with walking about 100 yards.  No wounds.  No rest pain.  No major changes from his last visit. ABIs today remain normal at 0.99 on the right and 1.16 on the left with multiphasic waveforms and normal digital pressures.  Current Outpatient Medications  Medication Sig Dispense Refill   ACCU-CHEK GUIDE test strip USE ONCE DAILY IN MORNING (E11.9) 100 strip 0   apixaban (ELIQUIS) 2.5 MG TABS tablet Take 1 tablet (2.5 mg total) by mouth 2 (two) times daily. 180 tablet 1   atorvastatin (LIPITOR) 20 MG tablet Take 20 mg by mouth daily.     blood glucose meter kit and supplies Dispense based on patient and insurance preference. Use once daily in the morning. (FOR ICD-10 E11.9). 1 each 0   COMBIVENT RESPIMAT 20-100 MCG/ACT AERS respimat Inhale 1 puff into the lungs every 6 (six) hours as needed.     gabapentin (NEURONTIN) 300 MG capsule Take 1-2 capsules (300-600 mg total) by mouth at bedtime. 180 capsule 4   latanoprost (XALATAN) 0.005 % ophthalmic solution Place 1 drop into both eyes at bedtime.     latanoprost (XALATAN) 0.005 % ophthalmic solution Apply to eye.     metFORMIN (GLUCOPHAGE XR) 500 MG 24 hr tablet Take 2 tablets (1,000 mg total) by mouth 2 (two) times daily. 360 tablet 4   sildenafil (VIAGRA) 50 MG tablet Take 1 tablet (50 mg total) by mouth daily as needed for erectile dysfunction. 10 tablet 0   No current facility-administered medications for this visit.    Past Medical History:  Diagnosis Date   CHF (congestive heart failure) (HCC)    Clotting disorder (HCC)    Diabetes mellitus without complication ([place])    Hyperlipidemia    MI (myocardial infarction) ([place])     Pulmonary embolism (HCC)     Past Surgical History:  Procedure Laterality Date   CARDIAC CATHETERIZATION     COLON SURGERY     1/3 colon removed as an infant   COLONOSCOPY WITH PROPOFOL N/A 12/26/2018   Procedure: COLONOSCOPY WITH PROPOFOL;  Surgeon: Lucilla Lame, MD;  Location: [facility] ENDOSCOPY;  Service: Endoscopy;  Laterality: N/A;   COLONOSCOPY WITH PROPOFOL N/A 04/01/2020   Procedure: COLONOSCOPY WITH PROPOFOL;  Surgeon: Lucilla Lame, MD;  Location[facility] ENDOSCOPY;  Service: Endoscopy;  Laterality: N/A;   heart stent placed     LOWER EXTREMITY ANGIOGRAPHY Right 10/23/2018   Procedure: LOWER EXTREMITY ANGIOGRAPHY;  Surgeon: Algernon Huxley, MD;  Location: [facility];  Service: Cardiovascular;  Laterality: Right;    Social History         Tobacco Use   Smoking status: Current Every Day Smoker      Packs/day: 1.00      Years: 45.00      Pack years: 90.00      Types: Cigarettes      Last attempt to quit: 09/15/2018      Years since quitting: 1.1   Smokeless tobacco: Never Used  Vaping Use   Vaping Use: Never used  Substance Use Topics   Alcohol use: Not Currently  Comment: denies ETOH today 09/06/19   Drug use: Not Currently      Types: Psilocybin             Family History  Problem Relation Age of Onset   Parkinson's disease Mother     Dementia Father    no bleeding or clotting disorders   No Known Allergies     REVIEW OF SYSTEMS (Negative unless checked)   Constitutional: [] Weight loss  [] Fever  [] Chills Cardiac: [] Chest pain   [] Chest pressure   [] Palpitations   [] Shortness of breath when laying flat   [] Shortness of breath at rest   [] Shortness of breath with exertion. Vascular:  [] Pain in legs with walking   [] Pain in legs at rest   [] Pain in legs when laying flat   [] Claudication   [] Pain in feet when walking  [] Pain in feet at rest  [] Pain in feet when laying flat   [x] History of DVT   [x] Phlebitis   [x] Swelling in legs   [] Varicose veins    [] Non-healing ulcers Pulmonary:   [] Uses home oxygen   [] Productive cough   [] Hemoptysis   [] Wheeze  [] COPD   [] Asthma Neurologic:  [] Dizziness  [] Blackouts   [] Seizures   [] History of stroke   [] History of TIA  [] Aphasia   [] Temporary blindness   [] Dysphagia   [] Weakness or numbness in arms   [x] Weakness or numbness in legs Musculoskeletal:  [x] Arthritis   [] Joint swelling   [] Joint pain   [] Low back pain Hematologic:  [] Easy bruising  [] Easy bleeding   [] Hypercoagulable state   [] Anemic  [] Hepatitis Gastrointestinal:  [] Blood in stool   [] Vomiting blood  [] Gastroesophageal reflux/heartburn   [] Abdominal pain Genitourinary:  [] Chronic kidney disease   [] Difficult urination  [] Frequent urination  [] Burning with urination   [] Hematuria Skin:  [] Rashes   [] Ulcers   [] Wounds Psychological:  [] History of anxiety   []  History of major depression.  Physical Examination  BP (!) 159/78   Pulse 68   Ht 6' (1.829 m)   Wt (!) 311 lb (141.1 kg)   BMI 42.18 kg/m  Gen:  WD/WN, NAD Head: [place]/AT, No temporalis wasting. Ear/Nose/Throat: Hearing grossly intact, nares w/o erythema or drainage Eyes: Conjunctiva clear. Sclera non-icteric Neck: Supple.  Trachea midline Pulmonary:  Good air movement, no use of accessory muscles.  Cardiac: RRR, no JVD Vascular:  Vessel Right Left  Radial Palpable Palpable                          PT 1+ Palpable 1+ Palpable  DP 2+ Palpable 2+ Palpable   Gastrointestinal: soft, non-tender/non-distended. No guarding/reflex.  Musculoskeletal: M/S 5/5 throughout.  No deformity or atrophy. 1+ BLE edema. Neurologic: Sensation grossly intact in extremities.  Symmetrical.  Speech is fluent.  Psychiatric: Judgment intact, Mood & affect appropriate for pt's clinical situation. Dermatologic: No rashes or ulcers noted.  No cellulitis or open wounds.      Labs Recent Results (from the past 2160 hour(s))  POCT glycosylated hemoglobin (Hb A1C)     Status: Abnormal    Collection Time: 11/04/20  8:59 AM  Result Value Ref Range   Hemoglobin A1C 6.0 (A) 4.0 - 5.6 %    Comment: Average 126    Radiology No results found.  Assessment/Plan Tobacco use disorder Definitely increases his clotting risk and cessation would be of benefit.   Diabetes mellitus without complication (HCC) blood glucose control important in reducing the progression of atherosclerotic  disease. Also, involved in wound healing. On appropriate medications.  Hyperlipidemia associated with type 2 diabetes mellitus (HCC) lipid control important in reducing the progression of atherosclerotic disease. Continue statin therapy   Peripheral artery disease (HCC) ABIs today remain normal at 0.99 on the right and 1.16 on the left with multiphasic waveforms and normal digital pressures.  No role for intervention.  Does still have pain in his hips and buttocks with walking but this may be multifactorial with arthritis and back issues as well.  We will plan to recheck his ABIs in 2 years.    Leotis Pain, MD  11/18/2020 12:09 PM    This note was created with Dragon medical transcription system.  Any errors from dictation are purely unintentional

## 2020-11-19 DIAGNOSIS — J439 Emphysema, unspecified: Secondary | ICD-10-CM | POA: Diagnosis not present

## 2020-11-19 DIAGNOSIS — G4733 Obstructive sleep apnea (adult) (pediatric): Secondary | ICD-10-CM | POA: Diagnosis not present

## 2020-11-19 DIAGNOSIS — R0609 Other forms of dyspnea: Secondary | ICD-10-CM | POA: Diagnosis not present

## 2020-11-20 ENCOUNTER — Ambulatory Visit (INDEPENDENT_AMBULATORY_CARE_PROVIDER_SITE_OTHER): Payer: Medicare Other

## 2020-11-20 ENCOUNTER — Encounter: Payer: Self-pay | Admitting: Family Medicine

## 2020-11-20 DIAGNOSIS — I252 Old myocardial infarction: Secondary | ICD-10-CM

## 2020-11-20 DIAGNOSIS — E1142 Type 2 diabetes mellitus with diabetic polyneuropathy: Secondary | ICD-10-CM

## 2020-11-20 NOTE — Chronic Care Management (AMB) (Signed)
  Chronic Care Management     11/20/2020 Name: Ronald Malone MRN: [id number] DOB: 21-Nov-1955    Ronald Malone was referred to the care management team for assistance with chronic care management and care coordination. His primary care provider will be notified of our unsuccessful attempts to maintain contact. The care management team will gladly outreach at any time in the future if he is interested in receiving assistance.   PLAN The care management team will gladly follow up with Ronald Malone after the primary care provider has a conversation with him regarding recommendation for care management engagement and subsequent re-referral for care management services.     [facility] [phone]

## 2020-12-11 ENCOUNTER — Encounter: Payer: Self-pay | Admitting: Family Medicine

## 2020-12-12 DIAGNOSIS — E1142 Type 2 diabetes mellitus with diabetic polyneuropathy: Secondary | ICD-10-CM | POA: Diagnosis not present

## 2020-12-12 DIAGNOSIS — I252 Old myocardial infarction: Secondary | ICD-10-CM

## 2020-12-18 DIAGNOSIS — G4733 Obstructive sleep apnea (adult) (pediatric): Secondary | ICD-10-CM | POA: Diagnosis not present

## 2020-12-18 DIAGNOSIS — R5382 Chronic fatigue, unspecified: Secondary | ICD-10-CM | POA: Diagnosis not present

## 2020-12-30 ENCOUNTER — Other Ambulatory Visit: Payer: Self-pay

## 2020-12-30 ENCOUNTER — Ambulatory Visit: Payer: Medicare Other | Admitting: Dermatology

## 2020-12-30 DIAGNOSIS — I872 Venous insufficiency (chronic) (peripheral): Secondary | ICD-10-CM | POA: Diagnosis not present

## 2020-12-30 NOTE — Progress Notes (Signed)
   Follow-Up Visit   Subjective  Ronald Malone is a 65 y.o. male who presents for the following: stasis dermatitis (B/L leg some improvement with TMC 0.1%, pt states he does have compression stockings but he does forget to use them. ).  The following portions of the chart were reviewed this encounter and updated as appropriate:   Tobacco  Allergies  Meds  Problems  Med Hx  Surg Hx  Fam Hx     Review of Systems:  No other skin or systemic complaints except as noted in HPI or Assessment and Plan.  Objective  Well appearing patient in no apparent distress; mood and affect are within normal limits.  A focused examination was performed including B/L leg. Relevant physical exam findings are noted in the Assessment and Plan.   Assessment & Plan  Stasis dermatitis of both legs B/L leg Improved, but not to goal.  Pt poorly compliant. Stasis in the legs causes chronic leg swelling, which may result in itchy or painful rashes, skin discoloration, skin texture changes, and sometimes ulceration.  Recommend daily compression hose/stockings- easiest to put on first thing in morning, remove at bedtime.  Elevate legs as much as possible. Avoid salt/sodium rich foods.  Continue TMC 0.1% cream BID 5d/wk prn for redness; itch or rash. Topical steroids (such as triamcinolone, fluocinolone, fluocinonide, mometasone, clobetasol, halobetasol, betamethasone, hydrocortisone) can cause thinning and lightening of the skin if they are used for too long in the same area. Your physician has selected the right strength medicine for your problem and area affected on the body. Please use your medication only as directed by your physician to prevent side effects.   Patient declines referral to vascular surgeon for evaluation at this time.  He feels like he is improved and doing well enough. Stressed compliance with compression stockings daily to reduce swelling.   Return if symptoms worsen or fail to improve.  Luther Redo, CMA, am acting as scribe for Sarina Ser, MD . Documentation: I have reviewed the above documentation for accuracy and completeness, and I agree with the above.  Sarina Ser, MD

## 2020-12-30 NOTE — Patient Instructions (Signed)

## 2021-01-01 ENCOUNTER — Encounter: Payer: Self-pay | Admitting: Dermatology

## 2021-01-18 DIAGNOSIS — G4733 Obstructive sleep apnea (adult) (pediatric): Secondary | ICD-10-CM | POA: Diagnosis not present

## 2021-01-18 DIAGNOSIS — R5382 Chronic fatigue, unspecified: Secondary | ICD-10-CM | POA: Diagnosis not present

## 2021-02-17 DIAGNOSIS — G4733 Obstructive sleep apnea (adult) (pediatric): Secondary | ICD-10-CM | POA: Diagnosis not present

## 2021-02-17 DIAGNOSIS — R5382 Chronic fatigue, unspecified: Secondary | ICD-10-CM | POA: Diagnosis not present

## 2021-02-19 ENCOUNTER — Telehealth: Payer: Self-pay

## 2021-03-06 ENCOUNTER — Ambulatory Visit: Payer: Medicare Other | Admitting: Family Medicine

## 2021-03-17 ENCOUNTER — Ambulatory Visit (INDEPENDENT_AMBULATORY_CARE_PROVIDER_SITE_OTHER): Payer: Medicare Other | Admitting: Family Medicine

## 2021-03-17 ENCOUNTER — Other Ambulatory Visit: Payer: Self-pay

## 2021-03-17 ENCOUNTER — Encounter: Payer: Self-pay | Admitting: Family Medicine

## 2021-03-17 VITALS — BP 141/68 | HR 66 | Temp 97.8°F | Wt 307.0 lb

## 2021-03-17 DIAGNOSIS — E785 Hyperlipidemia, unspecified: Secondary | ICD-10-CM | POA: Diagnosis not present

## 2021-03-17 DIAGNOSIS — Z125 Encounter for screening for malignant neoplasm of prostate: Secondary | ICD-10-CM | POA: Diagnosis not present

## 2021-03-17 DIAGNOSIS — I82431 Acute embolism and thrombosis of right popliteal vein: Secondary | ICD-10-CM

## 2021-03-17 DIAGNOSIS — E1169 Type 2 diabetes mellitus with other specified complication: Secondary | ICD-10-CM | POA: Diagnosis not present

## 2021-03-17 DIAGNOSIS — E1142 Type 2 diabetes mellitus with diabetic polyneuropathy: Secondary | ICD-10-CM | POA: Diagnosis not present

## 2021-03-17 LAB — POCT GLYCOSYLATED HEMOGLOBIN (HGB A1C): Hemoglobin A1C: 6.2 % — AB (ref 4.0–5.6)

## 2021-03-17 MED ORDER — METFORMIN HCL ER 500 MG PO TB24: 1000.0000 mg | ORAL_TABLET | Freq: Two times a day (BID) | ORAL | 4 refills | Status: DC

## 2021-03-17 MED ORDER — APIXABAN 2.5 MG PO TABS: 2.5000 mg | ORAL_TABLET | Freq: Two times a day (BID) | ORAL | 4 refills | Status: DC

## 2021-03-17 NOTE — Patient Instructions (Signed)
.   Please review the attached list of medications and notify my office if there are any errors.   . Please contact your eyecare professional to schedule a routine eye exam  

## 2021-03-17 NOTE — Progress Notes (Signed)
Established patient visit   Patient: Ronald Malone   DOB: December 19, 1955   66 y.o. Male  MRN: [id number] Visit Date: 03/17/2021  Today's healthcare provider: Lelon Huh, MD   No chief complaint on file.  Subjective    HPI  Diabetes Mellitus Type II, Follow-up  Lab Results  Component Value Date   HGBA1C 6.0 (A) 11/04/2020   HGBA1C 7.3 (A) 07/01/2020   HGBA1C 6.9 (A) 02/19/2020   Wt Readings from Last 3 Encounters:  03/17/21 (!) 307 lb (139.3 kg)  11/18/20 (!) 311 lb (141.1 kg)  11/04/20 (!) 317 lb (143.8 kg)   Last seen for diabetes 4 months ago.  Management since then includes putting gabapentin on hold due to blurry vision. Can try OTC b12 and alphalipoic acid. Also suggest taking over the counter vitamin B12 1,038m once every day which may help with neuropathy. He reports excellent compliance with treatment. He is not having side effects.  Symptoms: No fatigue No foot ulcerations  No appetite changes No nausea  Yes paresthesia of the feet  No polydipsia  No polyuria No visual disturbances   No vomiting     Home blood sugar records:  are not being checked  Episodes of hypoglycemia? No    Current insulin regiment: none Most Recent Eye Exam: not UTD Current exercise: Yoga Current diet habits: in general, a "healthy" diet    Pertinent Labs: Lab Results  Component Value Date   NA 137 02/19/2020   K 4.1 02/19/2020   CREATININE 0.90 05/19/2020   GFRNONAA >60 02/19/2020   MICROALBUR 20 11/14/2018   LABMICR 29.2 02/19/2020     ---------------------------------------------------------------------------------------------------   Lipid/Cholesterol, Follow-up  Last lipid panel Other pertinent labs  Lab Results  Component Value Date   CHOL 150 02/19/2020   HDL 39 (L) 02/19/2020   LDLCALC 76 02/19/2020   LDLDIRECT 71 10/18/2018   TRIG 207 (H) 02/19/2020   CHOLHDL 3.8 02/19/2020   Lab Results  Component Value Date   ALT 32 02/19/2020   AST 21  02/19/2020   PLT 242 02/19/2020   TSH 0.921 02/19/2020     He was last seen for this 4 months ago.  Management since that visit includes continuing atorvastatin   The ASCVD Risk score (Arnett DK, et al., 2019) failed to calculate for the following reasons:   The patient has a prior MI or stroke diagnosis  ---------------------------------------------------------------------------------------------------   Medications: Outpatient Medications Prior to Visit  Medication Sig   ACCU-CHEK GUIDE test strip USE ONCE DAILY IN MORNING (E11.9)   apixaban (ELIQUIS) 2.5 MG TABS tablet Take 1 tablet (2.5 mg total) by mouth 2 (two) times daily.   atorvastatin (LIPITOR) 20 MG tablet Take 20 mg by mouth daily.   COMBIVENT RESPIMAT 20-100 MCG/ACT AERS respimat Inhale 1 puff into the lungs every 6 (six) hours as needed.   gabapentin (NEURONTIN) 300 MG capsule Take 1-2 capsules (300-600 mg total) by mouth at bedtime.   latanoprost (XALATAN) 0.005 % ophthalmic solution Apply to eye.   metFORMIN (GLUCOPHAGE XR) 500 MG 24 hr tablet Take 2 tablets (1,000 mg total) by mouth 2 (two) times daily.   sildenafil (VIAGRA) 50 MG tablet Take 1 tablet (50 mg total) by mouth daily as needed for erectile dysfunction.   blood glucose meter kit and supplies Dispense based on patient and insurance preference. Use once daily in the morning. (FOR ICD-10 E11.9). (Patient not taking: Reported on 03/17/2021)   latanoprost (XALATAN) 0.005 %  ophthalmic solution Place 1 drop into both eyes at bedtime.   No facility-administered medications prior to visit.    Review of Systems  Constitutional: Negative.   Respiratory: Negative.    Cardiovascular: Negative.   Gastrointestinal: Negative.   Skin:  Negative for wound.  Neurological:  Positive for numbness. Negative for dizziness, light-headedness and headaches.      Objective    BP (!) 141/68 (BP Location: Right Arm, Patient Position: Sitting, Cuff Size: Large)    Pulse 66     Temp 97.8 F (36.6 C) (Oral)    Wt (!) 307 lb (139.3 kg)    SpO2 99%    BMI 41.64 kg/m   Physical Exam  General appearance: Severely obese male, cooperative and in no acute distress Head: Normocephalic, without obvious abnormality, atraumatic Respiratory: Respirations even and unlabored, normal respiratory rate Extremities: All extremities are intact.  Skin: Skin color, texture, turgor normal. No rashes seen  Psych: Appropriate mood and affect. Neurologic: Mental status: Alert, oriented to person, place, and time, thought content appropriate.   Results for orders placed or performed in visit on 03/17/21  POCT glycosylated hemoglobin (Hb A1C)  Result Value Ref Range   Hemoglobin A1C 6.2 (A) 4.0 - 5.6 %     Assessment & Plan     1. Type 2 diabetes mellitus with diabetic polyneuropathy, without long-term current use of insulin (HCC) Well controlled.  REfill metFORMIN (GLUCOPHAGE XR) 500 MG 24 hr tablet; Take 2 tablets (1,000 mg total) by mouth 2 (two) times daily.  Dispense: 360 tablet; Refill: 4 - Urine Microalbumin w/creat. ratio  2. Morbid obesity (HCC)  - TSH Diet and exercise  3. Hyperlipidemia associated with type 2 diabetes mellitus ([place]) He is tolerating atorvastatin well with no adverse effects.   - CBC - Comprehensive metabolic panel - Lipid panel  4. Prostate cancer screening  - PSA Total (Reflex To Free) ([facility] only)  5. Deep vein thrombosis (DVT) of popliteal vein of right lower extremity, unspecified chronicity (HCC)/ Pulmonary Embolism On long term NOAC per Dr. Tasia Catchings.  refill apixaban (ELIQUIS) 2.5 MG TABS tablet; Take 1 tablet (2.5 mg total) by mouth 2 (two) times daily.  Dispense: 180 tablet; Refill: 4         The entirety of the information documented in the History of Present Illness, Review of Systems and Physical Exam were personally obtained by me. Portions of this information were initially documented by the CMA and reviewed by me for thoroughness and  accuracy.     Lelon Huh, MD  [facility] [phone] (phone) [phone] (fax)  [place]

## 2021-03-18 LAB — COMPREHENSIVE METABOLIC PANEL
ALT: 21 IU/L (ref 0–44)
AST: 19 IU/L (ref 0–40)
Albumin/Globulin Ratio: 2.1 (ref 1.2–2.2)
Albumin: 4.6 g/dL (ref 3.8–4.8)
Alkaline Phosphatase: 78 IU/L (ref 44–121)
BUN/Creatinine Ratio: 21 (ref 10–24)
BUN: 18 mg/dL (ref 8–27)
Bilirubin Total: 0.4 mg/dL (ref 0.0–1.2)
CO2: 22 mmol/L (ref 20–29)
Calcium: 9.2 mg/dL (ref 8.6–10.2)
Chloride: 102 mmol/L (ref 96–106)
Creatinine, Ser: 0.84 mg/dL (ref 0.76–1.27)
Globulin, Total: 2.2 g/dL (ref 1.5–4.5)
Glucose: 112 mg/dL — ABNORMAL HIGH (ref 70–99)
Potassium: 4.6 mmol/L (ref 3.5–5.2)
Sodium: 138 mmol/L (ref 134–144)
Total Protein: 6.8 g/dL (ref 6.0–8.5)
eGFR: 96 mL/min/{1.73_m2} (ref >59)

## 2021-03-18 LAB — SPECIMEN STATUS REPORT

## 2021-03-18 LAB — CBC
Hematocrit: 43.4 % (ref 37.5–51.0)
Hemoglobin: 14.6 g/dL (ref 13.0–17.7)
MCH: 31.1 pg (ref 26.6–33.0)
MCHC: 33.6 g/dL (ref 31.5–35.7)
MCV: 92 fL (ref 79–97)
Platelets: 237 10*3/uL (ref 150–450)
RBC: 4.7 x10E6/uL (ref 4.14–5.80)
RDW: 11.9 % (ref 11.6–15.4)
WBC: 8.4 10*3/uL (ref 3.4–10.8)

## 2021-03-18 LAB — MICROALBUMIN / CREATININE URINE RATIO
Creatinine, Urine: 77.2 mg/dL
Microalb/Creat Ratio: 14 mg/g creat (ref 0–29)
Microalbumin, Urine: 10.5 ug/mL

## 2021-03-18 LAB — PSA TOTAL (REFLEX TO FREE): Prostate Specific Ag, Serum: 0.8 ng/mL (ref 0.0–4.0)

## 2021-03-18 LAB — LIPID PANEL
Chol/HDL Ratio: 2.9 ratio (ref 0.0–5.0)
Cholesterol, Total: 132 mg/dL (ref 100–199)
HDL: 45 mg/dL (ref >39)
LDL Chol Calc (NIH): 66 mg/dL (ref 0–99)
Triglycerides: 118 mg/dL (ref 0–149)
VLDL Cholesterol Cal: 21 mg/dL (ref 5–40)

## 2021-03-18 LAB — TSH: TSH: 0.889 u[IU]/mL (ref 0.450–4.500)

## 2021-03-20 DIAGNOSIS — G4733 Obstructive sleep apnea (adult) (pediatric): Secondary | ICD-10-CM | POA: Diagnosis not present

## 2021-03-20 DIAGNOSIS — R5382 Chronic fatigue, unspecified: Secondary | ICD-10-CM | POA: Diagnosis not present

## 2021-03-25 NOTE — Telephone Encounter (Signed)
scheduling

## 2021-04-07 DIAGNOSIS — G4733 Obstructive sleep apnea (adult) (pediatric): Secondary | ICD-10-CM | POA: Diagnosis not present

## 2021-04-07 DIAGNOSIS — I25118 Atherosclerotic heart disease of native coronary artery with other forms of angina pectoris: Secondary | ICD-10-CM | POA: Diagnosis not present

## 2021-04-07 DIAGNOSIS — Z86711 Personal history of pulmonary embolism: Secondary | ICD-10-CM | POA: Diagnosis not present

## 2021-04-07 DIAGNOSIS — Z87891 Personal history of nicotine dependence: Secondary | ICD-10-CM | POA: Diagnosis not present

## 2021-04-07 DIAGNOSIS — R0602 Shortness of breath: Secondary | ICD-10-CM | POA: Diagnosis not present

## 2021-04-07 DIAGNOSIS — I739 Peripheral vascular disease, unspecified: Secondary | ICD-10-CM | POA: Diagnosis not present

## 2021-04-07 DIAGNOSIS — F17200 Nicotine dependence, unspecified, uncomplicated: Secondary | ICD-10-CM | POA: Diagnosis not present

## 2021-04-07 DIAGNOSIS — Z86718 Personal history of other venous thrombosis and embolism: Secondary | ICD-10-CM | POA: Diagnosis not present

## 2021-04-07 DIAGNOSIS — E119 Type 2 diabetes mellitus without complications: Secondary | ICD-10-CM | POA: Diagnosis not present

## 2021-04-07 DIAGNOSIS — R0609 Other forms of dyspnea: Secondary | ICD-10-CM | POA: Diagnosis not present

## 2021-04-07 DIAGNOSIS — J439 Emphysema, unspecified: Secondary | ICD-10-CM | POA: Diagnosis not present

## 2021-04-16 ENCOUNTER — Ambulatory Visit (INDEPENDENT_AMBULATORY_CARE_PROVIDER_SITE_OTHER): Payer: Medicare Other

## 2021-04-16 DIAGNOSIS — Z0000 Encounter for general adult medical examination without abnormal findings: Secondary | ICD-10-CM | POA: Diagnosis not present

## 2021-04-16 NOTE — Progress Notes (Signed)
Virtual Visit via Telephone Note  I connected with  Ronald Malone on 04/16/21 at  9:40 AM EST by telephone and verified that I am speaking with the correct person using two identifiers.  Location: Patient: home Provider: [facility] Persons participating in the virtual visit: [place]   I discussed the limitations, risks, security and privacy concerns of performing an evaluation and management service by telephone and the availability of in person appointments. The patient expressed understanding and agreed to proceed.  Interactive audio and video telecommunications were attempted between this nurse and patient, however failed, due to patient having technical difficulties OR patient did not have access to video capability.  We continued and completed visit with audio only.  Some vital signs may be absent or patient reported.   Dionisio David, LPN  Subjective:   Ronald Malone is a 66 y.o. male who presents for Medicare Annual/Subsequent preventive examination.  Review of Systems           Objective:    There were no vitals filed for this visit. There is no height or weight on file to calculate BMI.  Advanced Directives 04/10/2020 04/01/2020 02/19/2020 09/06/2019 08/21/2019 02/21/2019 12/26/2018  Does Patient Have a Medical Advance Directive? _0  No No  Would patient like information on creating a medical advance directive? No - Patient declined No - Patient declined - No - Patient declined No - Patient declined - -    Current Medications (verified) Outpatient Encounter Medications as of 04/16/2021  Medication Sig   ACCU-CHEK GUIDE test strip USE ONCE DAILY IN MORNING (E11.9)   apixaban (ELIQUIS) 2.5 MG TABS tablet Take 1 tablet (2.5 mg total) by mouth 2 (two) times daily.   atorvastatin (LIPITOR) 20 MG tablet Take 20 mg by mouth daily.   blood glucose meter kit and supplies Dispense based on patient and insurance preference. Use once daily in the morning.  (FOR ICD-10 E11.9). (Patient not taking: Reported on 03/17/2021)   COMBIVENT RESPIMAT 20-100 MCG/ACT AERS respimat Inhale 1 puff into the lungs every 6 (six) hours as needed.   FLUZONE HIGH-DOSE QUADRIVALENT 0.7 ML SUSY    latanoprost (XALATAN) 0.005 % ophthalmic solution Place 1 drop into both eyes at bedtime.   latanoprost (XALATAN) 0.005 % ophthalmic solution Apply to eye.   metFORMIN (GLUCOPHAGE XR) 500 MG 24 hr tablet Take 2 tablets (1,000 mg total) by mouth 2 (two) times daily.   MODERNA COVID-19 BIVAL BOOSTER 50 MCG/0.5ML injection    sildenafil (VIAGRA) 50 MG tablet Take 1 tablet (50 mg total) by mouth daily as needed for erectile dysfunction.   No facility-administered encounter medications on file as of 04/16/2021.    Allergies (verified) Patient has no known allergies.   History: Past Medical History:  Diagnosis Date   CHF (congestive heart failure) (HCC)    Clotting disorder (HCC)    Diabetes mellitus without complication ([place])    Hyperlipidemia    MI (myocardial infarction) ([place])    Pulmonary embolism (HCC)    Past Surgical History:  Procedure Laterality Date   CARDIAC CATHETERIZATION     COLON SURGERY     1/3 colon removed as an infant   COLONOSCOPY WITH PROPOFOL N/A 12/26/2018   Procedure: COLONOSCOPY WITH PROPOFOL;  Surgeon: Lucilla Lame, MD;  Location: [facility] ENDOSCOPY;  Service: Endoscopy;  Laterality: N/A;   COLONOSCOPY WITH PROPOFOL N/A 04/01/2020   Procedure: COLONOSCOPY WITH PROPOFOL;  Surgeon: Lucilla Lame, MD;  Location: [facility] ENDOSCOPY;  Service: Endoscopy;  Laterality: N/A;   heart stent placed     LOWER EXTREMITY ANGIOGRAPHY Right 10/23/2018   Procedure: LOWER EXTREMITY ANGIOGRAPHY;  Surgeon: Algernon Huxley, MD;  Location: [place] [facility];  Service: Cardiovascular;  Laterality: Right;   Family History  Problem Relation Age of Onset   Parkinson's disease Mother    Dementia Father    Social History   Socioeconomic History   Marital status: Married     Spouse name: [name]    Number of children: 2   Years of education: Not on file   Highest education level: Bachelor's degree (e.g., BA, AB, BS)  Occupational History   Occupation: RETIRED   Tobacco Use   Smoking status: Every Day    Packs/day: 1.00    Years: 45.00    Pack years: 45.00    Types: Cigarettes   Smokeless tobacco: Never  Vaping Use   Vaping Use: Never used  Substance and Sexual Activity   Alcohol use: Not Currently    Comment: denies ETOH today 09/06/19   Drug use: Not Currently    Types: Psilocybin   Sexual activity: Not on file  Other Topics Concern   Not on file  Social History Narrative   Not on file   Social Determinants of Health   Financial Resource Strain: Not on file  Food Insecurity: No Food Insecurity   Worried About Running Out of Food in the Last Year: Never true   Ran Out of Food in the Last Year: Never true  Transportation Needs: No Transportation Needs   Lack of Transportation (Medical): No   Lack of Transportation (Non-Medical): No  Physical Activity: Not on file  Stress: Not on file  Social Connections: Not on file    Tobacco Counseling Ready to quit: Not Answered Counseling given: Not Answered   Clinical Intake:  Pre-visit preparation completed: No  Pain : No/denies pain     Nutritional Risks: None Diabetes: Yes CBG done?: No Did pt. bring in CBG monitor from home?: No  How often do you need to have someone help you when you read instructions, pamphlets, or other written materials from your doctor or pharmacy?: 1 - Never  Diabetic?yes Nutrition Risk Assessment:  Has the patient had any N/V/D within the last 2 months?  No  Does the patient have any non-healing wounds?  No  Has the patient had any unintentional weight loss or weight gain?  No   Diabetes:  Is the patient diabetic?  Yes  If diabetic, was a CBG obtained today?  No  Did the patient bring in their glucometer from home?  No  How often do you monitor your  CBG's? never.   Financial Strains and Diabetes Management:  Are you having any financial strains with the device, your supplies or your medication? No .  Does the patient want to be seen by Chronic Care Management for management of their diabetes?  No  Would the patient like to be referred to a Nutritionist or for Diabetic Management?  No   Diabetic Exams:  Diabetic Eye Exam: Completed 01/21/20. Overdue for diabetic eye exam. Pt has been advised about the importance in completing this exam.  Diabetic Foot Exam: Completed 11/14/18. Pt has been advised about the importance in completing this exam.    Interpreter Needed?: No  Information entered by :: Kirke Shaggy, LPN   Activities of Daily Living In your present state of health, do you have any difficulty performing the following activities:  04/12/2021 07/01/2020  Hearing? Tempie Donning  Vision? N Y  Difficulty concentrating or making decisions? N N  Walking or climbing stairs? Y Y  Dressing or bathing? Y Y  Doing errands, shopping? - N  Conservation officer, nature and eating ? N -  Using the Toilet? N -  In the past six months, have you accidently leaked urine? Y -  Do you have problems with loss of bowel control? N -  Managing your Medications? N -  Managing your Finances? N -  Housekeeping or managing your Housekeeping? N -  Some recent data might be hidden    Patient Care Team: Birdie Sons, MD as PCP - General (Family Medicine) Lucilla Lame, MD as Consulting Physician (Gastroenterology) Erby Pian, MD as Referring Physician (Specialist) Yolonda Kida, MD as Consulting Physician (Cardiology) Dingeldein, Remo Lipps, MD (Ophthalmology) Lucky Cowboy Erskine Squibb, MD as Referring Physician (Vascular Surgery) Pa, [place] (Optometry)  Indicate any recent Medical Services you may have received from other than [facility] providers in the past year (date may be approximate).     Assessment:   This is a routine wellness examination for  J[place]on.  Hearing/Vision screen No results found.  Dietary issues and exercise activities discussed:     Goals Addressed   None    Depression Screen PHQ 2/9 Scores 07/11/2020 07/01/2020 04/10/2020 02/19/2020 10/18/2018 10/05/2018  PHQ - 2 Score 0 0 0 0 0 0  PHQ- 9 Score - 0 - _0 Fall Risk Fall Risk  04/12/2021 07/11/2020 07/01/2020 04/10/2020 02/19/2020  Falls in the past year? 0 0 0 0 0  Number falls in past yr: 0 0 0 0 0  Injury with Fall? 0 - 0 0 0  Risk for fall due to : - - - - No Fall Risks  Follow up - Falls prevention discussed Falls evaluation completed - Falls evaluation completed    FALL RISK PREVENTION PERTAINING TO THE HOME:  Any stairs in or around the home? Yes  If so, are there any without handrails? No  Home free of loose throw rugs in walkways, pet beds, electrical cords, etc? Yes  Adequate lighting in your home to reduce risk of falls? Yes   ASSISTIVE DEVICES UTILIZED TO PREVENT FALLS:  Life alert? No  Use of a cane, walker or w/c? No  Grab bars in the bathroom? No  Shower chair or bench in shower? No  Elevated toilet seat or a handicapped toilet? No   Cognitive Function:Normal cognitive status assessed by direct observation by this Nurse Health Advisor. No abnormalities found.          Immunizations Immunization History  Administered Date(s) Administered   Fluad Quad(high Dose 65+) 01/02/2019, 01/23/2020   Influenza, High Dose Seasonal PF 12/16/2020   Moderna Covid-19 Vaccine Bivalent Booster 16yr & up 12/16/2020   Moderna Sars-Covid-2 Vaccination 03/28/2019, 04/25/2019, 01/07/2020   Pneumococcal Conjugate-13 10/18/2018   Pneumococcal Polysaccharide-23 03/24/2020   Tdap 09/18/2019   Zoster Recombinat (Shingrix) 02/11/2020, 04/24/2020    TDAP status: Up to date  Flu Vaccine status: Up to date  Pneumococcal vaccine status: Up to date  Covid-19 vaccine status: Completed vaccines  Qualifies for Shingles Vaccine? Yes   Zostavax completed  No   Shingrix Completed?: Yes  Screening Tests Health Maintenance  Topic Date Due   FOOT EXAM  11/14/2019   OPHTHALMOLOGY EXAM  01/20/2021   HEMOGLOBIN A1C  09/14/2021   URINE MICROALBUMIN  03/17/2022   COLONOSCOPY (Pts  45-8yr Insurance coverage will need to be confirmed)  04/01/2025   TETANUS/TDAP  09/17/2029   Pneumonia Vaccine 66 Years old  Completed   INFLUENZA VACCINE  Completed   COVID-19 Vaccine  Completed   Hepatitis C Screening  Completed   Zoster Vaccines- Shingrix  Completed   HPV VACCINES  Aged Out    Health Maintenance  Health Maintenance Due  Topic Date Due   FOOT EXAM  11/14/2019   OPHTHALMOLOGY EXAM  01/20/2021    Colorectal cancer screening: Type of screening: Colonoscopy. Completed 04/01/20. Repeat every 5 years  Lung Cancer Screening: (Low Dose CT Chest recommended if Age 68-80years, 30 pack-year currently smoking OR have quit w/in 15years.) does not qualify.    Additional Screening:  Hepatitis C Screening: does qualify; Completed 10/18/18  Vision Screening: Recommended annual ophthalmology exams for early detection of glaucoma and other disorders of the eye. Is the patient up to date with their annual eye exam?  Yes  Who is the provider or what is the name of the [facility] which the patient attends annual [facility] exams[facility] provider, would they like to be referred to a provider to establish care? No .   Dental Screening: Recommended annual dental exams for proper oral hygiene  Community Resource Referral / Chronic Care Management: CRR required this visit?  No   CCM required this visit?  No      Plan:     I have personally reviewed and noted the following in the patients chart:   Medical and social history Use of alcohol, tobacco or illicit drugs  Current medications and supplements including opioid prescriptions. Patient is not currently taking opioid prescriptions. Functional ability and  status Nutritional status Physical activity Advanced directives List of other physicians Hospitalizations, surgeries, and ER visits in previous 12 months Vitals Screenings to include cognitive, depression, and falls Referrals and appointments  In addition, I have reviewed and discussed with patient certain preventive protocols, quality metrics, and best practice recommendations. A written personalized care plan for preventive services as well as general preventive health recommendations were provided to patient.     LDionisio David LPN   [id number]  Nurse Notes: none

## 2021-04-16 NOTE — Patient Instructions (Signed)
Ronald Malone , Thank you for taking time to come for your Medicare Wellness Visit. I appreciate your ongoing commitment to your health goals. Please review the following plan we discussed and let me know if I can assist you in the future.   Screening recommendations/referrals: Colonoscopy: 04/01/20 Recommended yearly ophthalmology/optometry visit for glaucoma screening and checkup Recommended yearly dental visit for hygiene and checkup  Vaccinations: Influenza vaccine: 12/16/20 Pneumococcal vaccine: 12/22/20 Tdap vaccine: 09/18/19 Shingles vaccine: Shingrix 02/11/20, 04/24/20   Covid-19: 03/28/19, 04/25/19, 01/07/20, 12/16/20  Advanced directives: no  Conditions/risks identified: none  Next appointment: Follow up in one year for your annual wellness visit. 04/20/22 @ 11am by phone  Preventive Care 65 Years and Older, Male Preventive care refers to lifestyle choices and visits with your health care provider that can promote health and wellness. What does preventive care include? A yearly physical exam. This is also called an annual well check. Dental exams once or twice a year. Routine eye exams. Ask your health care provider how often you should have your eyes checked. Personal lifestyle choices, including: Daily care of your teeth and gums. Regular physical activity. Eating a healthy diet. Avoiding tobacco and drug use. Limiting alcohol use. Practicing safe sex. Taking low doses of aspirin every day. Taking vitamin and mineral supplements as recommended by your health care provider. What happens during an annual well check? The services and screenings done by your health care provider during your annual well check will depend on your age, overall health, lifestyle risk factors, and family history of disease. Counseling  Your health care provider may ask you questions about your: Alcohol use. Tobacco use. Drug use. Emotional well-being. Home and relationship well-being. Sexual  activity. Eating habits. History of falls. Memory and ability to understand (cognition). Work and work Statistician. Screening  You may have the following tests or measurements: Height, weight, and BMI. Blood pressure. Lipid and cholesterol levels. These may be checked every 5 years, or more frequently if you are over 64 years old. Skin check. Lung cancer screening. You may have this screening every year starting at age 90 if you have a 30-pack-year history of smoking and currently smoke or have quit within the past 15 years. Fecal occult blood test (FOBT) of the stool. You may have this test every year starting at age 59. Flexible sigmoidoscopy or colonoscopy. You may have a sigmoidoscopy every 5 years or a colonoscopy every 10 years starting at age 21. Prostate cancer screening. Recommendations will vary depending on your family history and other risks. Hepatitis C blood test. Hepatitis B blood test. Sexually transmitted disease (STD) testing. Diabetes screening. This is done by checking your blood sugar (glucose) after you have not eaten for a while (fasting). You may have this done every 1-3 years. Abdominal aortic aneurysm (AAA) screening. You may need this if you are a current or former smoker. Osteoporosis. You may be screened starting at age 62 if you are at high risk. Talk with your health care provider about your test results, treatment options, and if necessary, the need for more tests. Vaccines  Your health care provider may recommend certain vaccines, such as: Influenza vaccine. This is recommended every year. Tetanus, diphtheria, and acellular pertussis (Tdap, Td) vaccine. You may need a Td booster every 10 years. Zoster vaccine. You may need this after age 55. Pneumococcal 13-valent conjugate (PCV13) vaccine. One dose is recommended after age 75. Pneumococcal polysaccharide (PPSV23) vaccine. One dose is recommended after age 6. Talk to your  health care provider about which  screenings and vaccines you need and how often you need them. This information is not intended to replace advice given to you by your health care provider. Make sure you discuss any questions you have with your health care provider. Document Released: 03/28/2015 Document Revised: 11/19/2015 Document Reviewed: 12/31/2014 Elsevier Interactive Patient Education  2017 [place] Prevention in the Home Falls can cause injuries. They can happen to people of all ages. There are many things you can do to make your home safe and to help prevent falls. What can I do on the outside of my home? Regularly fix the edges of walkways and driveways and fix any cracks. Remove anything that might make you trip as you walk through a door, such as a raised step or threshold. Trim any bushes or trees on the path to your home. Use bright outdoor lighting. Clear any walking paths of anything that might make someone trip, such as rocks or tools. Regularly check to see if handrails are loose or broken. Make sure that both sides of any steps have handrails. Any raised decks and porches should have guardrails on the edges. Have any leaves, snow, or ice cleared regularly. Use sand or salt on walking paths during winter. Clean up any spills in your garage right away. This includes oil or grease spills. What can I do in the bathroom? Use night lights. Install grab bars by the toilet and in the tub and shower. Do not use towel bars as grab bars. Use non-skid mats or decals in the tub or shower. If you need to sit down in the shower, use a plastic, non-slip stool. Keep the floor dry. Clean up any water that spills on the floor as soon as it happens. Remove soap buildup in the tub or shower regularly. Attach bath mats securely with double-sided non-slip rug tape. Do not have throw rugs and other things on the floor that can make you trip. What can I do in the bedroom? Use night lights. Make sure that you have a  light by your bed that is easy to reach. Do not use any sheets or blankets that are too big for your bed. They should not hang down onto the floor. Have a firm chair that has side arms. You can use this for support while you get dressed. Do not have throw rugs and other things on the floor that can make you trip. What can I do in the kitchen? Clean up any spills right away. Avoid walking on wet floors. Keep items that you use a lot in easy-to-reach places. If you need to reach something above you, use a strong step stool that has a grab bar. Keep electrical cords out of the way. Do not use floor polish or wax that makes floors slippery. If you must use wax, use non-skid floor wax. Do not have throw rugs and other things on the floor that can make you trip. What can I do with my stairs? Do not leave any items on the stairs. Make sure that there are handrails on both sides of the stairs and use them. Fix handrails that are broken or loose. Make sure that handrails are as long as the stairways. Check any carpeting to make sure that it is firmly attached to the stairs. Fix any carpet that is loose or worn. Avoid having throw rugs at the top or bottom of the stairs. If you do have throw rugs, attach them  to the floor with carpet tape. Make sure that you have a light switch at the top of the stairs and the bottom of the stairs. If you do not have them, ask someone to add them for you. What else can I do to help prevent falls? Wear shoes that: Do not have high heels. Have rubber bottoms. Are comfortable and fit you well. Are closed at the toe. Do not wear sandals. If you use a stepladder: Make sure that it is fully opened. Do not climb a closed stepladder. Make sure that both sides of the stepladder are locked into place. Ask someone to hold it for you, if possible. Clearly mark and make sure that you can see: Any grab bars or handrails. First and last steps. Where the edge of each step  is. Use tools that help you move around (mobility aids) if they are needed. These include: Canes. Walkers. Scooters. Crutches. Turn on the lights when you go into a dark area. Replace any light bulbs as soon as they burn out. Set up your furniture so you have a clear path. Avoid moving your furniture around. If any of your floors are uneven, fix them. If there are any pets around you, be aware of where they are. Review your medicines with your doctor. Some medicines can make you feel dizzy. This can increase your chance of falling. Ask your doctor what other things that you can do to help prevent falls. This information is not intended to replace advice given to you by your health care provider. Make sure you discuss any questions you have with your health care provider. Document Released: 12/26/2008 Document Revised: 08/07/2015 Document Reviewed: 04/05/2014 Elsevier Interactive Patient Education  2017 [employer].

## 2021-04-22 DIAGNOSIS — H6122 Impacted cerumen, left ear: Secondary | ICD-10-CM | POA: Diagnosis not present

## 2021-04-22 DIAGNOSIS — H7201 Central perforation of tympanic membrane, right ear: Secondary | ICD-10-CM | POA: Diagnosis not present

## 2021-04-22 DIAGNOSIS — H73003 Acute myringitis, bilateral: Secondary | ICD-10-CM | POA: Diagnosis not present

## 2021-04-22 DIAGNOSIS — J301 Allergic rhinitis due to pollen: Secondary | ICD-10-CM | POA: Diagnosis not present

## 2021-04-22 DIAGNOSIS — H65111 Acute and subacute allergic otitis media (mucoid) (sanguinous) (serous), right ear: Secondary | ICD-10-CM | POA: Diagnosis not present

## 2021-04-27 DIAGNOSIS — H40003 Preglaucoma, unspecified, bilateral: Secondary | ICD-10-CM | POA: Diagnosis not present

## 2021-05-06 DIAGNOSIS — G4733 Obstructive sleep apnea (adult) (pediatric): Secondary | ICD-10-CM | POA: Diagnosis not present

## 2021-05-19 DIAGNOSIS — G4733 Obstructive sleep apnea (adult) (pediatric): Secondary | ICD-10-CM | POA: Diagnosis not present

## 2021-05-19 DIAGNOSIS — Z87891 Personal history of nicotine dependence: Secondary | ICD-10-CM | POA: Diagnosis not present

## 2021-05-19 DIAGNOSIS — J449 Chronic obstructive pulmonary disease, unspecified: Secondary | ICD-10-CM | POA: Diagnosis not present

## 2021-05-19 DIAGNOSIS — R0609 Other forms of dyspnea: Secondary | ICD-10-CM | POA: Diagnosis not present

## 2021-05-20 DIAGNOSIS — J301 Allergic rhinitis due to pollen: Secondary | ICD-10-CM | POA: Diagnosis not present

## 2021-05-20 DIAGNOSIS — H65111 Acute and subacute allergic otitis media (mucoid) (sanguinous) (serous), right ear: Secondary | ICD-10-CM | POA: Diagnosis not present

## 2021-07-14 ENCOUNTER — Other Ambulatory Visit: Payer: Self-pay | Admitting: Specialist

## 2021-07-14 DIAGNOSIS — Z87891 Personal history of nicotine dependence: Secondary | ICD-10-CM

## 2021-07-14 DIAGNOSIS — J449 Chronic obstructive pulmonary disease, unspecified: Secondary | ICD-10-CM

## 2021-07-29 ENCOUNTER — Ambulatory Visit
Admission: RE | Admit: 2021-07-29 | Discharge: 2021-07-29 | Disposition: A | Discharge status: Home / Self Care | Payer: Medicare Other | Source: Ambulatory Visit | Attending: Specialist | Admitting: Specialist

## 2021-07-29 DIAGNOSIS — Z87891 Personal history of nicotine dependence: Secondary | ICD-10-CM

## 2021-07-29 DIAGNOSIS — J432 Centrilobular emphysema: Secondary | ICD-10-CM | POA: Diagnosis not present

## 2021-07-29 DIAGNOSIS — I700 Atherosclerosis of aorta: Secondary | ICD-10-CM | POA: Diagnosis not present

## 2021-07-29 DIAGNOSIS — I2510 Atherosclerotic heart disease of native coronary artery without angina pectoris: Secondary | ICD-10-CM | POA: Diagnosis not present

## 2021-07-29 DIAGNOSIS — J449 Chronic obstructive pulmonary disease, unspecified: Secondary | ICD-10-CM

## 2021-07-30 ENCOUNTER — Encounter (INDEPENDENT_AMBULATORY_CARE_PROVIDER_SITE_OTHER): Payer: Self-pay | Admitting: Vascular Surgery

## 2021-07-31 NOTE — Telephone Encounter (Signed)
Bring him in for ABIs and R DVT study with JD or myself.  Let him know that if these studies are negative he will need to see his Pcp

## 2021-08-06 ENCOUNTER — Other Ambulatory Visit (INDEPENDENT_AMBULATORY_CARE_PROVIDER_SITE_OTHER): Payer: Self-pay | Admitting: Nurse Practitioner

## 2021-08-06 DIAGNOSIS — M25561 Pain in right knee: Secondary | ICD-10-CM

## 2021-08-07 ENCOUNTER — Encounter (INDEPENDENT_AMBULATORY_CARE_PROVIDER_SITE_OTHER): Payer: Self-pay | Admitting: Nurse Practitioner

## 2021-08-07 ENCOUNTER — Ambulatory Visit (INDEPENDENT_AMBULATORY_CARE_PROVIDER_SITE_OTHER): Payer: Medicare Other | Admitting: Nurse Practitioner

## 2021-08-07 ENCOUNTER — Ambulatory Visit (INDEPENDENT_AMBULATORY_CARE_PROVIDER_SITE_OTHER): Payer: Medicare Other

## 2021-08-07 VITALS — BP 156/74 | HR 65 | Resp 18 | Ht 72.0 in | Wt 308.0 lb

## 2021-08-07 DIAGNOSIS — M25561 Pain in right knee: Secondary | ICD-10-CM

## 2021-08-07 DIAGNOSIS — I739 Peripheral vascular disease, unspecified: Secondary | ICD-10-CM

## 2021-08-07 DIAGNOSIS — E119 Type 2 diabetes mellitus without complications: Secondary | ICD-10-CM

## 2021-08-07 DIAGNOSIS — M79606 Pain in leg, unspecified: Secondary | ICD-10-CM

## 2021-08-11 ENCOUNTER — Ambulatory Visit
Admission: RE | Admit: 2021-08-11 | Discharge: 2021-08-11 | Disposition: A | Discharge status: Home / Self Care | Payer: Medicare Other | Attending: Family Medicine | Admitting: Family Medicine

## 2021-08-11 ENCOUNTER — Ambulatory Visit (INDEPENDENT_AMBULATORY_CARE_PROVIDER_SITE_OTHER): Payer: Medicare Other | Admitting: Family Medicine

## 2021-08-11 ENCOUNTER — Ambulatory Visit
Admission: RE | Admit: 2021-08-11 | Discharge: 2021-08-11 | Disposition: A | Discharge status: Home / Self Care | Payer: Medicare Other | Source: Ambulatory Visit | Attending: Family Medicine | Admitting: Family Medicine

## 2021-08-11 ENCOUNTER — Encounter: Payer: Self-pay | Admitting: Family Medicine

## 2021-08-11 VITALS — BP 151/72 | HR 69 | Temp 97.9°F | Resp 16 | Wt 306.0 lb

## 2021-08-11 DIAGNOSIS — M25561 Pain in right knee: Secondary | ICD-10-CM | POA: Insufficient documentation

## 2021-08-11 NOTE — Progress Notes (Signed)
     I,Jana Robinson,acting as a scribe for Lelon Huh, MD.,have documented all relevant documentation on the behalf of Lelon Huh, MD,as directed by  Lelon Huh, MD while in the presence of Lelon Huh, MD.   Established patient visit   Patient: Ronald Malone   DOB: 1955-04-01   66 y.o. Male  MRN: [id number] Visit Date: 08/11/2021  Today's healthcare provider: Lelon Huh, MD   Chief Complaint  Patient presents with   Knee Pain  CC knee pain Subjective    Pain  He reports new onset right knee pain behind knee into hamstring.  Unknown  an injury that may have caused the pain. The pain started a few weeks ago and is staying constant. The pain does radiates to hamstring. The pain is described as aching, is moderate in intensity, occurring constantly. Symptoms are worse in the: n/a  Aggravating factors: any movement.  Relieving factors: wrapping with ace bandage .  He has tried ace bandage and Tylenol with moderate relief.  Sleeping with knee wrapped at times.   Pain behind right knee x 3-4 weeks. No obvious injury. Seen by vascular last week and reports had normal ultrasound and no clots.   ---------------------------------------------------------------------------------------------------   Medications: Outpatient Medications Prior to Visit  Medication Sig   ACCU-CHEK GUIDE test strip USE ONCE DAILY IN MORNING (E11.9)   apixaban (ELIQUIS) 2.5 MG TABS tablet Take 1 tablet (2.5 mg total) by mouth 2 (two) times daily.   atorvastatin (LIPITOR) 20 MG tablet Take 20 mg by mouth daily.   blood glucose meter kit and supplies Dispense based on patient and insurance preference. Use once daily in the morning. (FOR ICD-10 E11.9).   COMBIVENT RESPIMAT 20-100 MCG/ACT AERS respimat Inhale 1 puff into the lungs every 6 (six) hours as needed.   cyanocobalamin 1000 MCG tablet Take 1,000 mcg by mouth daily.   FLUZONE HIGH-DOSE QUADRIVALENT 0.7 ML SUSY    latanoprost (XALATAN) 0.005 %  ophthalmic solution Place 1 drop into both eyes at bedtime.   metFORMIN (GLUCOPHAGE XR) 500 MG 24 hr tablet Take 2 tablets (1,000 mg total) by mouth 2 (two) times daily.   MODERNA COVID-19 BIVAL BOOSTER 50 MCG/0.5ML injection    No facility-administered medications prior to visit.         Objective    BP (!) 151/72 (BP Location: Left Arm, Patient Position: Sitting, Cuff Size: Large)   Pulse 69   Temp 97.9 F (36.6 C) (Oral)   Resp 16   Wt (!) 306 lb (138.8 kg)   SpO2 99%   BMI 41.50 kg/m    Physical Exam  Slightly tender soft cystic area lateral aspect of popliteal fossa. No erythema, no induration.     Assessment & Plan     1. Acute pain of right knee Associated with sore, cystic structure lateral aspect of popliteal fossa c/w bakers cyst. DVT was ruled out by vascular ultrasound last week at Dr. Ozella Almond office.  - DG Knee Complete 4 Views Right; Future      The entirety of the information documented in the History of Present Illness, Review of Systems and Physical Exam were personally obtained by me. Portions of this information were initially documented by the CMA and reviewed by me for thoroughness and accuracy.     Lelon Huh, MD  [facility] [phone] (phone) [phone] (fax)  [place]

## 2021-08-11 NOTE — Progress Notes (Deleted)
      Established patient visit   Patient: Ronald Malone   DOB: [birth year]   66 y.o. Male  MRN: [id number] Visit Date: 08/11/2021  Today's healthcare provider: Lelon Huh, MD   No chief complaint on file.  Subjective    Knee Pain    ***  Medications: Outpatient Medications Prior to Visit  Medication Sig   ACCU-CHEK GUIDE test strip USE ONCE DAILY IN MORNING (E11.9)   apixaban (ELIQUIS) 2.5 MG TABS tablet Take 1 tablet (2.5 mg total) by mouth 2 (two) times daily.   atorvastatin (LIPITOR) 20 MG tablet Take 20 mg by mouth daily.   blood glucose meter kit and supplies Dispense based on patient and insurance preference. Use once daily in the morning. (FOR ICD-10 E11.9).   COMBIVENT RESPIMAT 20-100 MCG/ACT AERS respimat Inhale 1 puff into the lungs every 6 (six) hours as needed.   cyanocobalamin 1000 MCG tablet Take 1,000 mcg by mouth daily.   FLUZONE HIGH-DOSE QUADRIVALENT 0.7 ML SUSY    latanoprost (XALATAN) 0.005 % ophthalmic solution Place 1 drop into both eyes at bedtime.   metFORMIN (GLUCOPHAGE XR) 500 MG 24 hr tablet Take 2 tablets (1,000 mg total) by mouth 2 (two) times daily.   MODERNA COVID-19 BIVAL BOOSTER 50 MCG/0.5ML injection    No facility-administered medications prior to visit.    Review of Systems  Constitutional:  Negative for appetite change, chills and fever.  Respiratory:  Negative for chest tightness, shortness of breath and wheezing.   Cardiovascular:  Negative for chest pain and palpitations.  Gastrointestinal:  Negative for abdominal pain, nausea and vomiting.   {Labs  Heme  Chem  Endocrine  Serology  Results Review (optional):23779}   Objective    There were no vitals taken for this visit. {Show previous vital signs (optional):23777}  Physical Exam  ***  No results found for any visits on 08/11/21.  Assessment & Plan     ***  No follow-ups on file.      {provider attestation***:1}   Lelon Huh, MD  [facility] [phone] (phone) [phone] (fax)  [place]

## 2021-08-12 ENCOUNTER — Other Ambulatory Visit: Payer: Self-pay | Admitting: Family Medicine

## 2021-08-12 ENCOUNTER — Encounter: Payer: Self-pay | Admitting: Family Medicine

## 2021-08-12 DIAGNOSIS — M25561 Pain in right knee: Secondary | ICD-10-CM

## 2021-08-12 MED ORDER — CELECOXIB 200 MG PO CAPS: 200.0000 mg | ORAL_CAPSULE | Freq: Two times a day (BID) | ORAL | 0 refills | Status: AC

## 2021-08-13 ENCOUNTER — Other Ambulatory Visit: Payer: Medicare Other

## 2021-08-17 ENCOUNTER — Encounter (INDEPENDENT_AMBULATORY_CARE_PROVIDER_SITE_OTHER): Payer: Self-pay | Admitting: Nurse Practitioner

## 2021-08-17 NOTE — Progress Notes (Signed)
Subjective:    Patient ID: Ronald Malone, male    DOB: [birth year], 66 y.o.   MRN: [id number] No chief complaint on file.   Ronald Malone is a 66 year old male who presents today with complaints of pain behind his right knee.  He notes that it feels like the pain is approaching proximally now.  The patient has a history of circulatory issues and was concerned that he may have a current circulatory issue causing his pain.  He denies classic claudication-like symptoms.  There is no rest pain.  There is no wounds or ulcerations  Today noninvasive studies show an ABI of 1.07 on the right and 1.05 on the left.  He has biphasic tibial artery waveforms bilaterally with normal toe waveforms bilaterally.  He has normal TBI's bilaterally.  The patient has also underwent right lower extremity DVT study that shows no evidence of DVT or superficial thrombophlebitis in the right lower extremity.   Review of Systems  Musculoskeletal:  Positive for arthralgias.  All other systems reviewed and are negative.     Objective:   Physical Exam Vitals reviewed.  HENT:     Head: Normocephalic.  Cardiovascular:     Rate and Rhythm: Normal rate.  Pulmonary:     Effort: Pulmonary effort is normal.  Skin:    General: Skin is warm and dry.  Neurological:     Mental Status: He is alert and oriented to person, place, and time.     Gait: Gait abnormal.  Psychiatric:        Mood and Affect: Mood normal.        Behavior: Behavior normal.        Thought Content: Thought content normal.        Judgment: Judgment normal.    BP (!) 156/74 (BP Location: Right Arm)   Pulse 65   Resp 18   Ht 6' (1.829 m)   Wt (!) 308 lb (139.7 kg)   BMI 41.77 kg/m   Past Medical History:  Diagnosis Date   CHF (congestive heart failure) (HCC)    Clotting disorder (HCC)    Diabetes mellitus without complication ([place])    Hyperlipidemia    MI (myocardial infarction) ([place])    Pulmonary embolism (HCC)     Social History    Socioeconomic History   Marital status: Married    Spouse name: [name]    Number of children: 2   Years of education: Not on file   Highest education level: Bachelor's degree (e.g., BA, AB, BS)  Occupational History   Occupation: RETIRED   Tobacco Use   Smoking status: Every Day    Packs/day: 1.00    Years: 45.00    Pack years: 45.00    Types: Cigarettes   Smokeless tobacco: Never  Vaping Use   Vaping Use: Never used  Substance and Sexual Activity   Alcohol use: Not Currently    Comment: denies ETOH today 09/06/19   Drug use: Not Currently    Types: Psilocybin   Sexual activity: Not on file  Other Topics Concern   Not on file  Social History Narrative   Not on file   Social Determinants of Health   Financial Resource Strain: Low Risk    Difficulty of Paying Living Expenses: Not hard at all  Food Insecurity: No Food Insecurity   Worried About Charity fundraiser in the Last Year: Never true   [place] in the Last Year: Never true  Transportation Needs: No Data processing manager (Medical): No   Lack of Transportation (Non-Medical): No  Physical Activity: Insufficiently Active   Days of Exercise per Week: 2 days   Minutes of Exercise per Session: 10 min  Stress: No Stress Concern Present   Feeling of Stress : Not at all  Social Connections: Moderately Integrated   Frequency of Communication with Friends and Family: Twice a week   Frequency of Social Gatherings with Friends and Family: Once a week   Attends Religious Services: Never   Marine scientist or Organizations: Yes   Attends Archivist Meetings: Never   Marital Status: Married  Human resources officer Violence: Not At Risk   Fear of Current or Ex-Partner: No   Emotionally Abused: No   Physically Abused: No   Sexually Abused: No    Past Surgical History:  Procedure Laterality Date   CARDIAC CATHETERIZATION     COLON SURGERY     1/3 colon removed as an infant    COLONOSCOPY WITH PROPOFOL N/A 12/26/2018   Procedure: COLONOSCOPY WITH PROPOFOL;  Surgeon: Lucilla Lame, MD;  Location: [facility] ENDOSCOPY;  Service: Endoscopy;  Laterality: N/A;   COLONOSCOPY WITH PROPOFOL N/A 04/01/2020   Procedure: COLONOSCOPY WITH PROPOFOL;  Surgeon: Lucilla Lame, MD;  Location: [facility] ENDOSCOPY;  Service: Endoscopy;  Laterality: N/A;   heart stent placed     LOWER EXTREMITY ANGIOGRAPHY Right 10/23/2018   Procedure: LOWER EXTREMITY ANGIOGRAPHY;  Surgeon: Algernon Huxley, MD;  Location: [facility];  Service: Cardiovascular;  Laterality: Right;    Family History  Problem Relation Age of Onset   Parkinson's disease Mother    Dementia Father     No Known Allergies     Latest Ref Rng & Units 03/17/2021   10:47 AM 02/19/2020   10:04 AM 02/19/2020   10:01 AM  CBC  WBC 3.4 - 10.8 x10E3/uL 8.4   7.9   7.1    Hemoglobin 13.0 - 17.7 g/dL 14.6   13.8   13.7    Hematocrit 37.5 - 51.0 % 43.4   42.0   41.4    Platelets 150 - 450 x10E3/uL 237   242   253        CMP     Component Value Date/Time   NA 138 03/17/2021 1047   K 4.6 03/17/2021 1047   CL 102 03/17/2021 1047   CO2 22 03/17/2021 1047   GLUCOSE 112 (H) 03/17/2021 1047   GLUCOSE 148 (H) 02/19/2020 1004   BUN 18 03/17/2021 1047   CREATININE 0.84 03/17/2021 1047   CALCIUM 9.2 03/17/2021 1047   PROT 6.8 03/17/2021 1047   ALBUMIN 4.6 03/17/2021 1047   AST 19 03/17/2021 1047   ALT 21 03/17/2021 1047   ALKPHOS 78 03/17/2021 1047   BILITOT 0.4 03/17/2021 1047   GFRNONAA >60 02/19/2020 1004   GFRNONAA 82 02/19/2020 1001   GFRAA 95 02/19/2020 1001     VAS [place] ABI WITH/WO TBI  Result Date: 08/12/2021  LOWER EXTREMITY DOPPLER STUDY Patient Name:  Ronald Malone  Date of Exam:   08/07/2021 Medical Rec #: [id number]         Accession #:    [id number] Date of Birth: Feb 06, 1956         Patient Gender: M Patient Age:   40 years Exam Location:  [facility] Procedure:      VAS [place] ABI WITH/WO TBI Referring Phys:  --------------------------------------------------------------------------------  Indications: Right behind knee pain.  Performing Technologist: Concha Norway RVT  Examination Guidelines: A complete evaluation includes at minimum, Doppler waveform signals and systolic blood pressure reading at the level of bilateral brachial, anterior tibial, and posterior tibial arteries, when vessel segments are accessible. Bilateral testing is considered an integral part of a complete examination. Photoelectric Plethysmograph (PPG) waveforms and toe systolic pressure readings are included as required and additional duplex testing as needed. Limited examinations for reoccurring indications may be performed as noted.  ABI Findings: +---------+------------------+-----+--------+--------+ Right    Rt Pressure (mmHg)IndexWaveformComment  +---------+------------------+-----+--------+--------+ Brachial 160                                     +---------+------------------+-----+--------+--------+ ATA      165               1.03 biphasic         +---------+------------------+-----+--------+--------+ PTA      171               1.07 biphasic         +---------+------------------+-----+--------+--------+ Great Toe120               0.75 Normal           +---------+------------------+-----+--------+--------+ +---------+------------------+-----+--------+-------+ Left     Lt Pressure (mmHg)IndexWaveformComment +---------+------------------+-----+--------+-------+ Brachial 157                                    +---------+------------------+-----+--------+-------+ ATA                             absent          +---------+------------------+-----+--------+-------+ PTA      168               1.05 biphasic        +---------+------------------+-----+--------+-------+ Great Toe141               0.88 Normal          +---------+------------------+-----+--------+-------+  +-------+-----------+-----------+------------+------------+ ABI/TBIToday's ABIToday's TBIPrevious ABIPrevious TBI +-------+-----------+-----------+------------+------------+ Right  1.07       .75        .99         .79          +-------+-----------+-----------+------------+------------+ Left   1.05       .88        1.12        .68          +-------+-----------+-----------+------------+------------+ Bilateral ABIs appear essentially unchanged compared to prior study on 11/18/2020.  Summary: Right: Resting right ankle-brachial index is within normal range. No evidence of significant right lower extremity arterial disease. The right toe-brachial index is normal. Left: Resting left ankle-brachial index is within normal range. No evidence of significant left lower extremity arterial disease. The left toe-brachial index is normal. Distal ATA not detected and was found on angiogram to be heavily diseased in 2020. *See table(s) above for measurements and observations.  Electronically signed by Leotis Pain MD on 08/12/2021 at 9:18:56 AM.    Final        Assessment & Plan:   1. Peripheral artery disease ([place]) Studies today are consistent with previous studies done in 11/18/2020.  Based on this, as noted below, the pain is not related to his PAD.  Patient is advised to follow-up with PCP for further work-up and evaluation.  2. Diabetes mellitus without complication ([place]) Continue hypoglycemic medications as already ordered, these medications have been reviewed and there are no changes at this time.  Hgb A1C to be monitored as already arranged by primary service    3. Pain of lower extremity, unspecified laterality Recommend:  The patient has atypical pain symptoms for vascular disease and on exam I do not find evidence of vascular pathology that would explain the patient's symptoms.  Noninvasive studies do not identify significant vascular problems  I suspect the patient is c/o  pseudoclaudication.  Patient should have an evaluation of the LS spine which I defer to the primary service or the Spine service.  The patient should continue walking and begin a more formal exercise program. The patient should continue aggressive treatment of the lipid abnormalities.    Current Outpatient Medications on File Prior to Visit  Medication Sig Dispense Refill   ACCU-CHEK GUIDE test strip USE ONCE DAILY IN MORNING (E11.9) 100 strip 0   apixaban (ELIQUIS) 2.5 MG TABS tablet Take 1 tablet (2.5 mg total) by mouth 2 (two) times daily. 180 tablet 4   atorvastatin (LIPITOR) 20 MG tablet Take 20 mg by mouth daily.     blood glucose meter kit and supplies Dispense based on patient and insurance preference. Use once daily in the morning. (FOR ICD-10 E11.9). 1 each 0   COMBIVENT RESPIMAT 20-100 MCG/ACT AERS respimat Inhale 1 puff into the lungs every 6 (six) hours as needed.     cyanocobalamin 1000 MCG tablet Take 1,000 mcg by mouth daily.     latanoprost (XALATAN) 0.005 % ophthalmic solution Place 1 drop into both eyes at bedtime.     metFORMIN (GLUCOPHAGE XR) 500 MG 24 hr tablet Take 2 tablets (1,000 mg total) by mouth 2 (two) times daily. 360 tablet 4   No current facility-administered medications on file prior to visit.    There are no Patient Instructions on file for this visit. No follow-ups on file.   Kris Hartmann, NP

## 2021-08-24 ENCOUNTER — Encounter: Payer: Self-pay | Admitting: Family Medicine

## 2021-08-24 DIAGNOSIS — M25561 Pain in right knee: Secondary | ICD-10-CM

## 2021-08-25 DIAGNOSIS — M1711 Unilateral primary osteoarthritis, right knee: Secondary | ICD-10-CM | POA: Diagnosis not present

## 2021-10-01 DIAGNOSIS — E119 Type 2 diabetes mellitus without complications: Secondary | ICD-10-CM | POA: Diagnosis not present

## 2021-10-01 DIAGNOSIS — Z87891 Personal history of nicotine dependence: Secondary | ICD-10-CM | POA: Diagnosis not present

## 2021-10-01 DIAGNOSIS — R0609 Other forms of dyspnea: Secondary | ICD-10-CM | POA: Diagnosis not present

## 2021-10-01 DIAGNOSIS — R0602 Shortness of breath: Secondary | ICD-10-CM | POA: Diagnosis not present

## 2021-10-01 DIAGNOSIS — Z86718 Personal history of other venous thrombosis and embolism: Secondary | ICD-10-CM | POA: Diagnosis not present

## 2021-10-01 DIAGNOSIS — G4733 Obstructive sleep apnea (adult) (pediatric): Secondary | ICD-10-CM | POA: Diagnosis not present

## 2021-10-01 DIAGNOSIS — I25118 Atherosclerotic heart disease of native coronary artery with other forms of angina pectoris: Secondary | ICD-10-CM | POA: Diagnosis not present

## 2021-10-01 DIAGNOSIS — F17200 Nicotine dependence, unspecified, uncomplicated: Secondary | ICD-10-CM | POA: Diagnosis not present

## 2021-10-01 DIAGNOSIS — Z86711 Personal history of pulmonary embolism: Secondary | ICD-10-CM | POA: Diagnosis not present

## 2021-10-01 DIAGNOSIS — I739 Peripheral vascular disease, unspecified: Secondary | ICD-10-CM | POA: Diagnosis not present

## 2021-10-01 DIAGNOSIS — J439 Emphysema, unspecified: Secondary | ICD-10-CM | POA: Diagnosis not present

## 2021-10-07 ENCOUNTER — Ambulatory Visit: Payer: Medicare Other

## 2021-10-07 ENCOUNTER — Ambulatory Visit (LOCAL_COMMUNITY_HEALTH_CENTER): Payer: Medicare Other

## 2021-10-07 DIAGNOSIS — Z23 Encounter for immunization: Secondary | ICD-10-CM

## 2021-10-07 DIAGNOSIS — Z719 Counseling, unspecified: Secondary | ICD-10-CM

## 2021-10-07 NOTE — Progress Notes (Signed)
  Are you feeling sick today? No   Have you ever received a dose of COVID-19 Vaccine? [employer], [place], [place], [place], Other) Yes  If yes, which vaccine and how many doses?    [place] one   Did you bring the vaccination record card or other documentation?  Yes   Do you have a health condition or are undergoing treatment that makes you moderately or severely immunocompromised? This would include, but not be limited to: cancer, HIV, organ transplant, immunosuppressive therapy/high-dose corticosteroids, or moderate/severe primary immunodeficiency.  No  Have you received COVID-19 vaccine before or during hematopoietic cell transplant (HCT) or CAR-T-cell therapies? No  Have you ever had an allergic reaction to: (This would include a severe allergic reaction or a reaction that caused hives, swelling, or respiratory distress, including wheezing.) A component of a COVID-19 vaccine or a previous dose of COVID-19 vaccine? No   Have you ever had an allergic reaction to another vaccine (other thanCOVID-19 vaccine) or an injectable medication? (This would include a severe allergic reaction or a reaction that caused hives, swelling, or respiratory distress, including wheezing.)   No    Do you have a history of any of the following:  Myocarditis or Pericarditis No  Dermal fillers:  No  Multisystem Inflammatory Syndrome (MIS-C or MIS-A)? No  COVID-19 disease within the past 3 months? No  Vaccinated with monkeypox vaccine in the last 4 weeks? No  EUA for Moderna provided.  No questions. Vaccine administered and tolerated well.  No questions. Copy of Covid-19 card and NCIR given.  After vaccine care reviewed.  [place] Glascoe Shelda Pal, RN

## 2021-10-26 DIAGNOSIS — H40003 Preglaucoma, unspecified, bilateral: Secondary | ICD-10-CM | POA: Diagnosis not present

## 2021-10-26 LAB — HM DIABETES EYE EXAM

## 2021-11-03 DIAGNOSIS — Z9989 Dependence on other enabling machines and devices: Secondary | ICD-10-CM | POA: Diagnosis not present

## 2021-11-03 DIAGNOSIS — R0609 Other forms of dyspnea: Secondary | ICD-10-CM | POA: Diagnosis not present

## 2021-11-03 DIAGNOSIS — F17218 Nicotine dependence, cigarettes, with other nicotine-induced disorders: Secondary | ICD-10-CM | POA: Diagnosis not present

## 2021-11-03 DIAGNOSIS — G4733 Obstructive sleep apnea (adult) (pediatric): Secondary | ICD-10-CM | POA: Diagnosis not present

## 2021-11-03 DIAGNOSIS — Z87891 Personal history of nicotine dependence: Secondary | ICD-10-CM | POA: Diagnosis not present

## 2021-11-03 DIAGNOSIS — J449 Chronic obstructive pulmonary disease, unspecified: Secondary | ICD-10-CM | POA: Diagnosis not present

## 2021-11-03 DIAGNOSIS — J432 Centrilobular emphysema: Secondary | ICD-10-CM | POA: Diagnosis not present

## 2021-11-24 DIAGNOSIS — H35363 Drusen (degenerative) of macula, bilateral: Secondary | ICD-10-CM | POA: Diagnosis not present

## 2021-12-05 IMAGING — US US EXTREM LOW VENOUS*R*
1 series · 13 of 24 positions shown · non-contrast
Comparison: Bilateral lower extremity venous Doppler
ultrasound-09/01/2018

CLINICAL DATA: Right lower extremity pain and edema. History of
previous DVT and pulmonary embolism. Evaluate for acute or chronic
DVT.



[Series 1: us extrem low venous*right* · 0.10mm/px · 36 acquisitions, 13 frames shown]
[im 1/36]
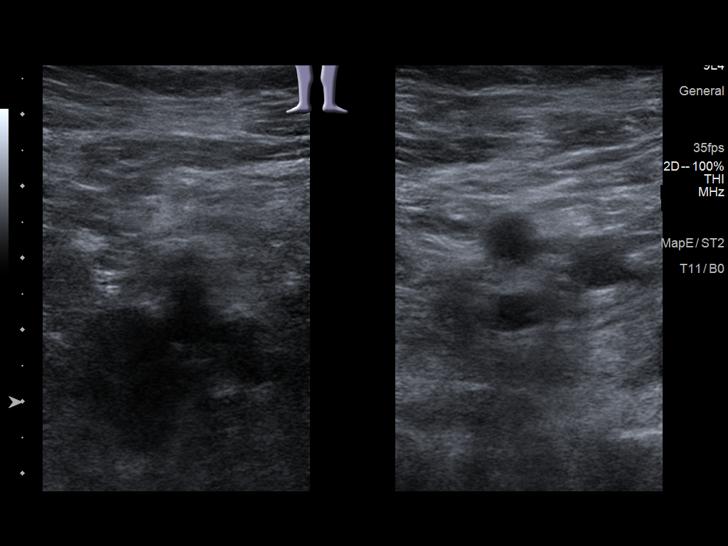
[im 4/36]
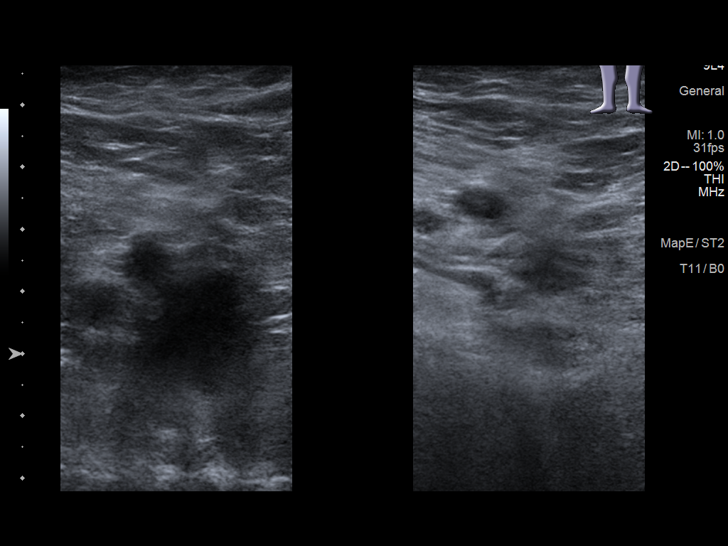
[im 7/36]
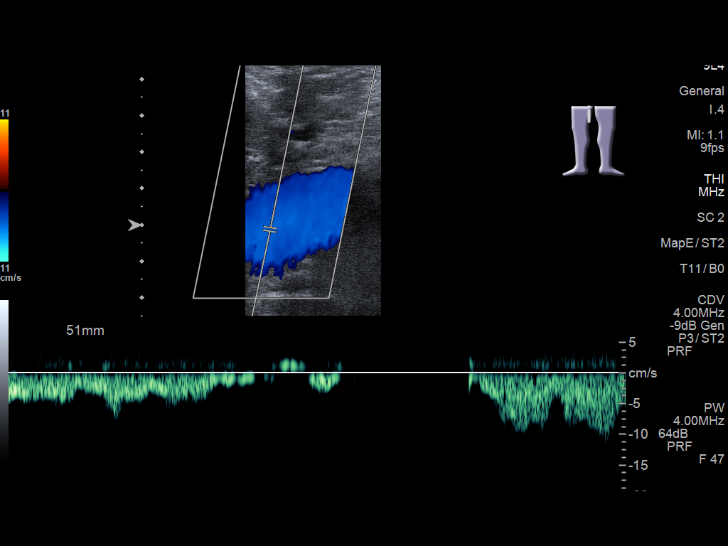
[im 10/36]
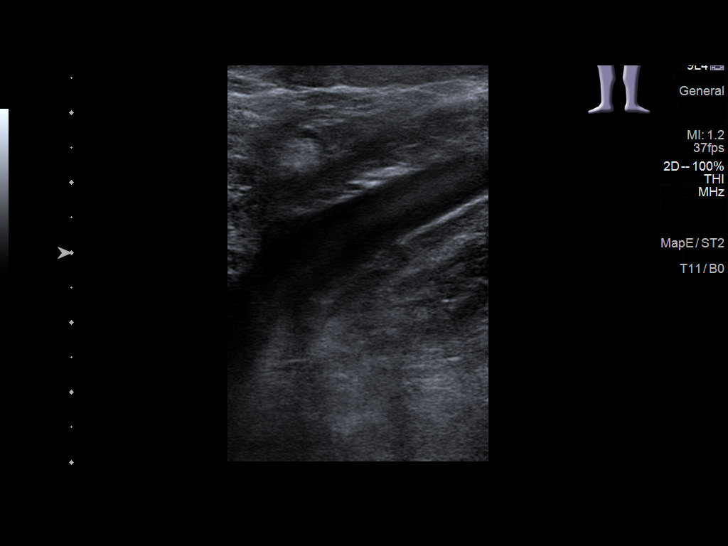
[im 13/36]
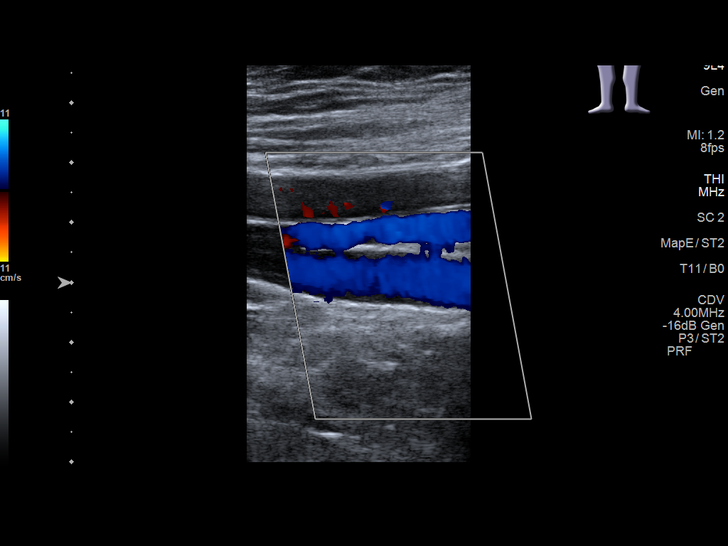
[im 14/36]
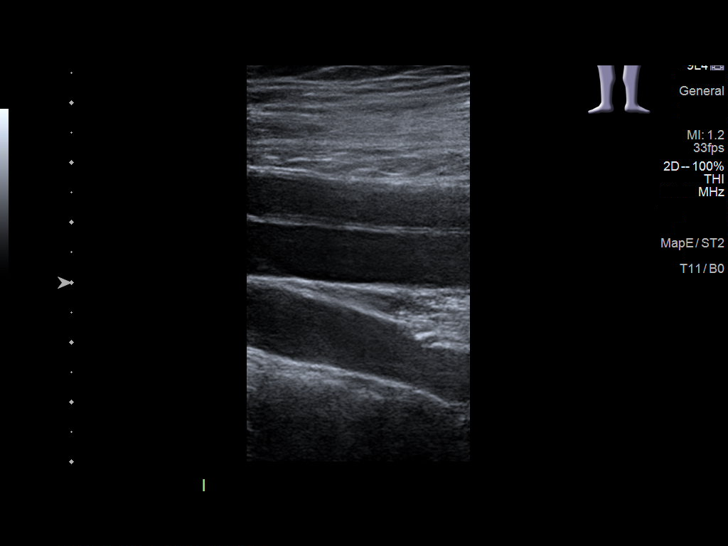
[im 17/36]
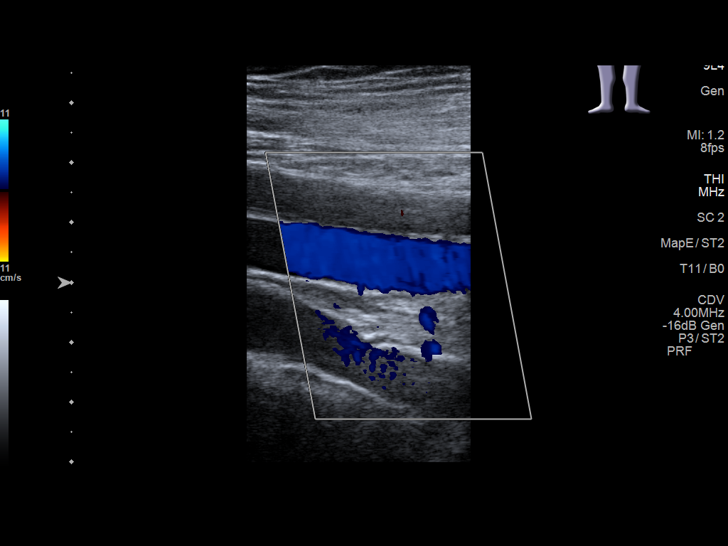
[im 19/36]
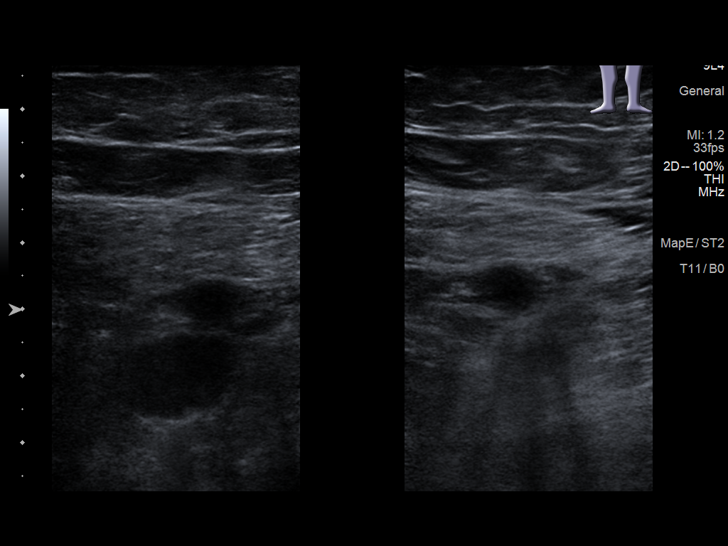
[im 22/36]
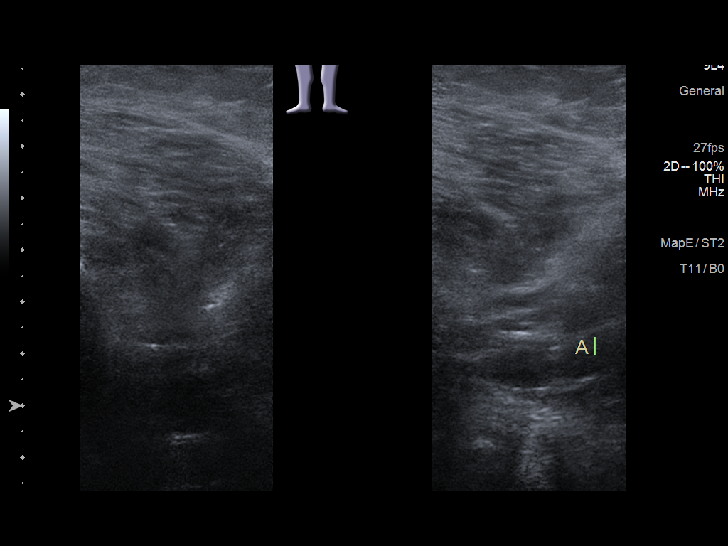
[im 25/36]
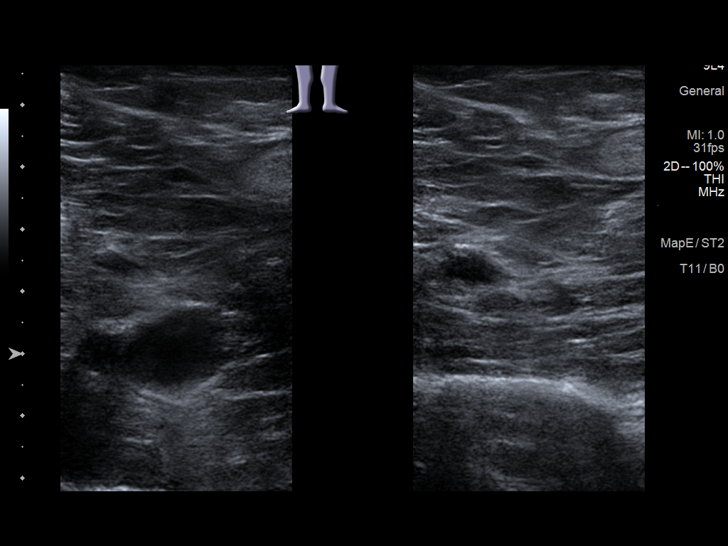
[im 29/36]
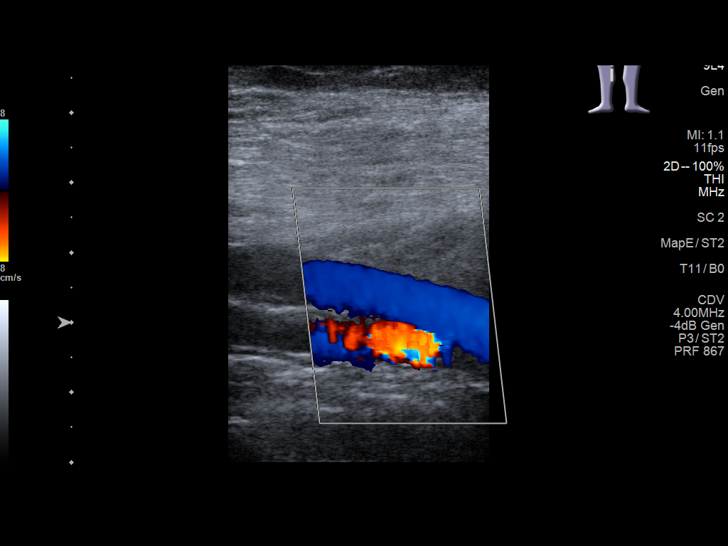
[im 32/36]
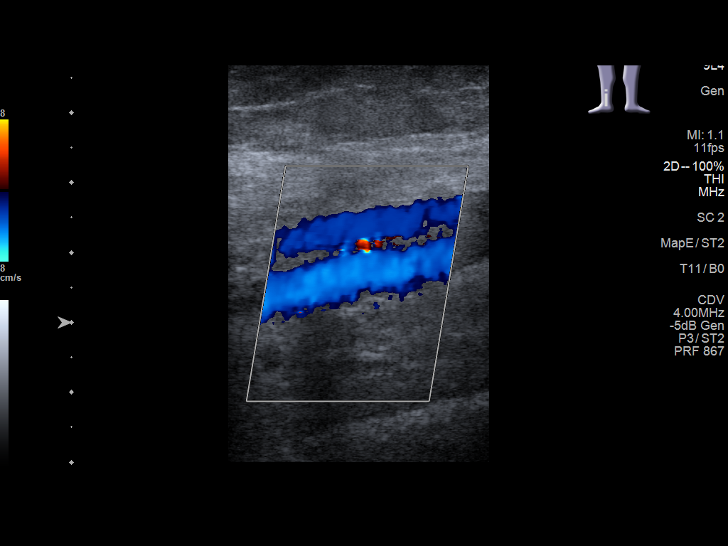
[im 36/36]
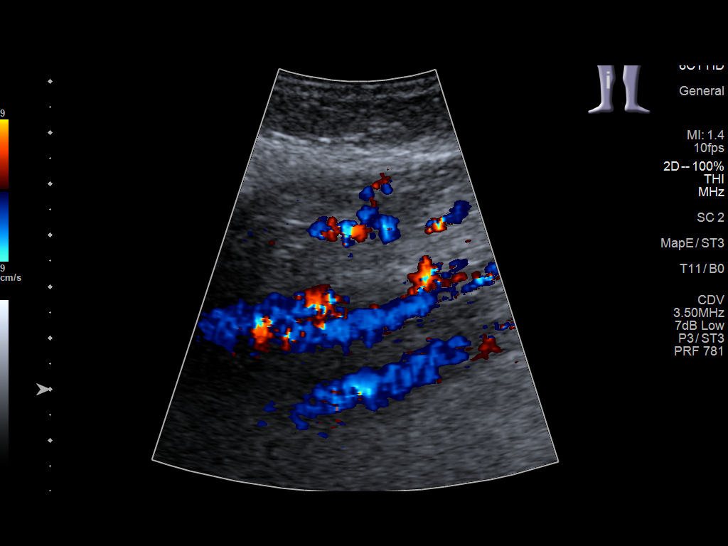

[13 of 24 positions shown; findings below may reference images not displayed]

FINDINGS: Contralateral Common Femoral Vein: Respiratory phasicity is normal
and symmetric with the symptomatic side. No evidence of thrombus.
Normal compressibility.

Common Femoral Vein: No evidence of thrombus. Normal
compressibility, respiratory phasicity and response to augmentation.

Saphenofemoral Junction: No evidence of thrombus. Normal
compressibility and flow on color Doppler imaging.

Profunda Femoral Vein: No evidence of thrombus. Normal
compressibility and flow on color Doppler imaging.

Femoral Vein: No evidence of thrombus. Normal compressibility,
respiratory phasicity and response to augmentation.

Popliteal Vein: No evidence of acute or chronic thrombus. Normal
compressibility, respiratory phasicity and response to augmentation.

Calf Veins: No evidence of thrombus. Normal compressibility and flow
on color Doppler imaging.

Superficial Great Saphenous Vein: No evidence of thrombus. Normal
compressibility.

Venous Reflux:  None.

Other Findings:  None.
IMPRESSION: No evidence of acute or chronic DVT within right lower extremity
with special attention paid to the right popliteal vein.

## 2021-12-23 ENCOUNTER — Encounter: Payer: Self-pay | Admitting: Family Medicine

## 2021-12-24 DIAGNOSIS — M1711 Unilateral primary osteoarthritis, right knee: Secondary | ICD-10-CM | POA: Diagnosis not present

## 2021-12-24 DIAGNOSIS — M25561 Pain in right knee: Secondary | ICD-10-CM | POA: Diagnosis not present

## 2021-12-29 DIAGNOSIS — M238X1 Other internal derangements of right knee: Secondary | ICD-10-CM | POA: Diagnosis not present

## 2021-12-29 DIAGNOSIS — M25561 Pain in right knee: Secondary | ICD-10-CM | POA: Diagnosis not present

## 2021-12-31 DIAGNOSIS — S83241A Other tear of medial meniscus, current injury, right knee, initial encounter: Secondary | ICD-10-CM | POA: Diagnosis not present

## 2022-01-05 DIAGNOSIS — H2511 Age-related nuclear cataract, right eye: Secondary | ICD-10-CM | POA: Diagnosis not present

## 2022-01-07 ENCOUNTER — Encounter: Payer: Self-pay | Admitting: Ophthalmology

## 2022-01-08 NOTE — Anesthesia Preprocedure Evaluation (Signed)
Anesthesia Evaluation  Patient identified by MRN, date of birth, ID band Patient awake  General Assessment Comment:  At 2022 colonoscopy, documented airway obstruction requiring nasal trumpet and jaw thrust for resolution.  Reviewed: Allergy & Precautions, H&P , NPO status , Patient's Chart, lab work & pertinent test results, reviewed documented beta blocker date and time   Airway Mallampati: III  TM Distance: >3 FB Neck ROM: full    Dental no notable dental hx. (+) Teeth Intact   Pulmonary neg pulmonary ROS, sleep apnea , COPD, former smoker,    Pulmonary exam normal breath sounds clear to auscultation       Cardiovascular Exercise Tolerance: Poor hypertension, + CAD, + Past MI, + Cardiac Stents, + Peripheral Vascular Disease and +CHF  negative cardio ROS   Rhythm:regular Rate:Normal     Neuro/Psych  Neuromuscular disease negative neurological ROS  negative psych ROS   GI/Hepatic negative GI ROS, Neg liver ROS,   Endo/Other  negative endocrine ROSdiabetes, Well Controlled, Type 2, Oral Hypoglycemic AgentsMorbid obesity  Renal/GU      Musculoskeletal   Abdominal   Peds  Hematology negative hematology ROS (+)   Anesthesia Other Findings   Reproductive/Obstetrics negative OB ROS                            Anesthesia Physical Anesthesia Plan  ASA: 3  Anesthesia Plan: MAC   Post-op Pain Management:    Induction:   PONV Risk Score and Plan:   Airway Management Planned:   Additional Equipment:   Intra-op Plan:   Post-operative Plan:   Informed Consent: I have reviewed the patients History and Physical, chart, labs and discussed the procedure including the risks, benefits and alternatives for the proposed anesthesia with the patient or authorized representative who has indicated his/her understanding and acceptance.       Plan Discussed with: CRNA  Anesthesia Plan Comments:          Anesthesia Quick Evaluation

## 2022-01-11 ENCOUNTER — Encounter (INDEPENDENT_AMBULATORY_CARE_PROVIDER_SITE_OTHER): Payer: Self-pay

## 2022-01-11 DIAGNOSIS — I82431 Acute embolism and thrombosis of right popliteal vein: Secondary | ICD-10-CM

## 2022-01-11 NOTE — Discharge Instructions (Signed)

## 2022-01-13 ENCOUNTER — Encounter
Admission: RE | Disposition: A | Discharge status: Home / Self Care | Payer: Self-pay | Source: Home / Self Care | Attending: Ophthalmology

## 2022-01-13 ENCOUNTER — Ambulatory Visit
Admission: RE | Admit: 2022-01-13 | Discharge: 2022-01-13 | Disposition: A | Discharge status: Home / Self Care | Payer: Medicare Other | Attending: Ophthalmology | Admitting: Ophthalmology

## 2022-01-13 ENCOUNTER — Ambulatory Visit: Payer: Medicare Other | Admitting: Anesthesiology

## 2022-01-13 ENCOUNTER — Other Ambulatory Visit: Payer: Self-pay

## 2022-01-13 DIAGNOSIS — Z6841 Body Mass Index (BMI) 40.0 and over, adult: Secondary | ICD-10-CM | POA: Insufficient documentation

## 2022-01-13 DIAGNOSIS — G4730 Sleep apnea, unspecified: Secondary | ICD-10-CM | POA: Insufficient documentation

## 2022-01-13 DIAGNOSIS — H2511 Age-related nuclear cataract, right eye: Secondary | ICD-10-CM | POA: Insufficient documentation

## 2022-01-13 DIAGNOSIS — J449 Chronic obstructive pulmonary disease, unspecified: Secondary | ICD-10-CM | POA: Insufficient documentation

## 2022-01-13 DIAGNOSIS — Z7984 Long term (current) use of oral hypoglycemic drugs: Secondary | ICD-10-CM | POA: Insufficient documentation

## 2022-01-13 DIAGNOSIS — E119 Type 2 diabetes mellitus without complications: Secondary | ICD-10-CM | POA: Diagnosis not present

## 2022-01-13 DIAGNOSIS — Z87891 Personal history of nicotine dependence: Secondary | ICD-10-CM | POA: Insufficient documentation

## 2022-01-13 DIAGNOSIS — Z86711 Personal history of pulmonary embolism: Secondary | ICD-10-CM | POA: Diagnosis not present

## 2022-01-13 DIAGNOSIS — E1136 Type 2 diabetes mellitus with diabetic cataract: Secondary | ICD-10-CM | POA: Diagnosis not present

## 2022-01-13 DIAGNOSIS — E1151 Type 2 diabetes mellitus with diabetic peripheral angiopathy without gangrene: Secondary | ICD-10-CM | POA: Insufficient documentation

## 2022-01-13 DIAGNOSIS — I110 Hypertensive heart disease with heart failure: Secondary | ICD-10-CM | POA: Diagnosis not present

## 2022-01-13 DIAGNOSIS — I2510 Atherosclerotic heart disease of native coronary artery without angina pectoris: Secondary | ICD-10-CM | POA: Insufficient documentation

## 2022-01-13 DIAGNOSIS — Z955 Presence of coronary angioplasty implant and graft: Secondary | ICD-10-CM | POA: Insufficient documentation

## 2022-01-13 DIAGNOSIS — I252 Old myocardial infarction: Secondary | ICD-10-CM | POA: Insufficient documentation

## 2022-01-13 DIAGNOSIS — E1165 Type 2 diabetes mellitus with hyperglycemia: Secondary | ICD-10-CM

## 2022-01-13 DIAGNOSIS — I509 Heart failure, unspecified: Secondary | ICD-10-CM | POA: Diagnosis not present

## 2022-01-13 HISTORY — DX: Emphysema, unspecified: J43.9

## 2022-01-13 HISTORY — DX: Polyneuropathy, unspecified: G62.9

## 2022-01-13 HISTORY — DX: Sleep apnea, unspecified: G47.30

## 2022-01-13 HISTORY — PX: CATARACT EXTRACTION W/PHACO: SHX586

## 2022-01-13 HISTORY — DX: Presence of external hearing-aid: Z97.4

## 2022-01-13 HISTORY — DX: Presence of dental prosthetic device (complete) (partial): Z97.2

## 2022-01-13 HISTORY — DX: Unspecified tear of unspecified meniscus, current injury, unspecified knee, initial encounter: S83.209A

## 2022-01-13 LAB — GLUCOSE, CAPILLARY
Glucose-Capillary: 136 mg/dL — ABNORMAL HIGH (ref 70–99)
Glucose-Capillary: 141 mg/dL — ABNORMAL HIGH (ref 70–99)

## 2022-01-13 SURGERY — PHACOEMULSIFICATION, CATARACT, WITH IOL INSERTION
Anesthesia: Monitor Anesthesia Care | Site: Eye | Laterality: Right

## 2022-01-13 MED ORDER — SIGHTPATH DOSE#1 BSS IO SOLN
INTRAOCULAR | Status: DC | PRN
  Administered 2022-01-13: 15 mL

## 2022-01-13 MED ORDER — ARMC OPHTHALMIC DILATING DROPS
1.0000 | OPHTHALMIC | Status: DC | PRN
  Administered 2022-01-13 (×3): 1 via OPHTHALMIC

## 2022-01-13 MED ORDER — SIGHTPATH DOSE#1 BSS IO SOLN
INTRAOCULAR | Status: DC | PRN
  Administered 2022-01-13: 1 mL via INTRAMUSCULAR

## 2022-01-13 MED ORDER — FENTANYL CITRATE (PF) 100 MCG/2ML IJ SOLN
INTRAMUSCULAR | Status: DC | PRN
  Administered 2022-01-13: 50 ug via INTRAVENOUS

## 2022-01-13 MED ORDER — LACTATED RINGERS IV SOLN: INTRAVENOUS | Status: DC

## 2022-01-13 MED ORDER — SIGHTPATH DOSE#1 NA HYALUR & NA CHOND-NA HYALUR IO KIT
PACK | INTRAOCULAR | Status: DC | PRN
  Administered 2022-01-13: 1 via OPHTHALMIC

## 2022-01-13 MED ORDER — CEFUROXIME OPHTHALMIC INJECTION 1 MG/0.1 ML
INJECTION | OPHTHALMIC | Status: DC | PRN
  Administered 2022-01-13: 0.1 mL via INTRACAMERAL

## 2022-01-13 MED ORDER — TETRACAINE HCL 0.5 % OP SOLN
1.0000 [drp] | OPHTHALMIC | Status: DC | PRN
  Administered 2022-01-13 (×3): 1 [drp] via OPHTHALMIC

## 2022-01-13 MED ORDER — FENTANYL CITRATE PF 50 MCG/ML IJ SOSY: 25.0000 ug | PREFILLED_SYRINGE | INTRAMUSCULAR | Status: DC | PRN

## 2022-01-13 MED ORDER — ONDANSETRON HCL 4 MG/2ML IJ SOLN: 4.0000 mg | Freq: Once | INTRAMUSCULAR | Status: DC | PRN

## 2022-01-13 MED ORDER — MIDAZOLAM HCL 2 MG/2ML IJ SOLN
INTRAMUSCULAR | Status: DC | PRN
  Administered 2022-01-13: 1 mg via INTRAVENOUS

## 2022-01-13 MED ORDER — SIGHTPATH DOSE#1 BSS IO SOLN
INTRAOCULAR | Status: DC | PRN
  Administered 2022-01-13: 87 mL via OPHTHALMIC

## 2022-01-13 MED ORDER — BRIMONIDINE TARTRATE-TIMOLOL 0.2-0.5 % OP SOLN
OPHTHALMIC | Status: DC | PRN
  Administered 2022-01-13: 1 [drp] via OPHTHALMIC

## 2022-01-13 SURGICAL SUPPLY — 10 items
CATARACT SUITE SIGHTPATH (MISCELLANEOUS) ×1 IMPLANT
FEE CATARACT SUITE SIGHTPATH (MISCELLANEOUS) ×1 IMPLANT
GLOVE SRG 8 PF TXTR STRL LF DI (GLOVE) ×1 IMPLANT
GLOVE SURG ENC TEXT LTX SZ7.5 (GLOVE) ×1 IMPLANT
GLOVE SURG UNDER POLY LF SZ8 (GLOVE) ×1
LENS IOL TECNIS EYHANCE 21.0 (Intraocular Lens) IMPLANT
NDL FILTER BLUNT 18X1 1/2 (NEEDLE) ×1 IMPLANT
NEEDLE FILTER BLUNT 18X1 1/2 (NEEDLE) ×1 IMPLANT
SYR 3ML LL SCALE MARK (SYRINGE) ×1 IMPLANT
WATER STERILE IRR 250ML POUR (IV SOLUTION) ×1 IMPLANT

## 2022-01-13 NOTE — Transfer of Care (Signed)
Immediate Anesthesia Transfer of Care Note  Patient: Ronald Malone  Procedure(s) Performed: CATARACT EXTRACTION PHACO AND INTRAOCULAR LENS PLACEMENT (IOC) RIGHT 9.56 01:34.8 (Right: Eye)  Patient Location: PACU  Anesthesia Type: MAC  Level of Consciousness: awake, alert  and patient cooperative  Airway and Oxygen Therapy: Patient Spontanous Breathing and Patient connected to supplemental oxygen  Post-op Assessment: Post-op Vital signs reviewed, Patient's Cardiovascular Status Stable, Respiratory Function Stable, Patent Airway and No signs of Nausea or vomiting  Post-op Vital Signs: Reviewed and stable  Complications: No notable events documented.

## 2022-01-13 NOTE — Op Note (Signed)
LOCATION:  [place]   PREOPERATIVE DIAGNOSIS:    Nuclear sclerotic cataract right eye. H25.11   POSTOPERATIVE DIAGNOSIS:  Nuclear sclerotic cataract right eye.     PROCEDURE:  Phacoemusification with posterior chamber intraocular lens placement of the right eye   ULTRASOUND TIME: Procedure(s) with comments: CATARACT EXTRACTION PHACO AND INTRAOCULAR LENS PLACEMENT (IOC) RIGHT 9.56 01:34.8 (Right) - Diabetic  LENS:   Implant Name Type Inv. Item Serial No. Manufacturer Lot No. LRB No. Used Action  LENS IOL TECNIS EYHANCE 21.0 - [id number] Intraocular Lens LENS IOL TECNIS EYHANCE 21.0 [id number] [employer]  Right 1 Implanted         SURGEON:  Wyonia Hough, MD   ANESTHESIA:  Topical with tetracaine drops and 2% Xylocaine jelly, augmented with 1% preservative-free intracameral lidocaine.    COMPLICATIONS:  None.   DESCRIPTION OF PROCEDURE:  The patient was identified in the holding room and transported to the operating room and placed in the supine position under the operating microscope.  The right eye was identified as the operative eye and it was prepped and draped in the usual sterile ophthalmic fashion.   A 1 millimeter clear-corneal paracentesis was made at the 12:00 position.  0.5 ml of preservative-free 1% lidocaine was injected into the anterior chamber. The anterior chamber was filled with Viscoat viscoelastic.  A 2.4 millimeter keratome was used to make a near-clear corneal incision at the 9:00 position.  A curvilinear capsulorrhexis was made with a cystotome and capsulorrhexis forceps.  Balanced salt solution was used to hydrodissect and hydrodelineate the nucleus.   Phacoemulsification was then used in stop and chop fashion to remove the lens nucleus and epinucleus.  The remaining cortex was then removed using the irrigation and aspiration handpiece. Provisc was then placed into the capsular bag to distend it for lens placement.  A lens was then injected  into the capsular bag.  The remaining viscoelastic was aspirated.   Wounds were hydrated with balanced salt solution.  The anterior chamber was inflated to a physiologic pressure with balanced salt solution.  No wound leaks were noted. Cefuroxime 0.1 ml of a '10mg'$ /ml solution was injected into the anterior chamber for a dose of 1 mg of intracameral antibiotic at the completion of the case.   Timolol and Brimonidine drops were applied to the eye.  The patient was taken to the recovery room in stable condition without complications of anesthesia or surgery.   Seydina Holliman 01/13/2022, 8:23 AM

## 2022-01-13 NOTE — H&P (Signed)
[facility]   Primary Care Physician:  Birdie Sons, MD Ophthalmologist: Dr. Leandrew Koyanagi  Pre-Procedure History & Physical: HPI:  Ronald Malone is a 66 y.o. male here for ophthalmic surgery.   Past Medical History:  Diagnosis Date   CHF (congestive heart failure) (HCC)    Clotting disorder (HCC)    Diabetes mellitus without complication ([place])    Emphysema lung (HCC)    Hyperlipidemia    MI (myocardial infarction) ([place]) 2015   Peripheral neuropathy    Pulmonary embolism ([place]) 2020   Sleep apnea    CPAP   Torn meniscus    right   Wears dentures    full upper, partial lower   Wears hearing aid in right ear     Past Surgical History:  Procedure Laterality Date   CARDIAC CATHETERIZATION  2015   stent   COLON SURGERY     1/3 colon removed as an infant   COLONOSCOPY WITH PROPOFOL N/A 12/26/2018   Procedure: COLONOSCOPY WITH PROPOFOL;  Surgeon: Lucilla Lame, MD;  Location: [facility] ENDOSCOPY;  Service: Endoscopy;  Laterality: N/A;   COLONOSCOPY WITH PROPOFOL N/A 04/01/2020   Procedure: COLONOSCOPY WITH PROPOFOL;  Surgeon: Lucilla Lame, MD;  Location: [facility] ENDOSCOPY;  Service: Endoscopy;  Laterality: N/A;   heart stent placed     LOWER EXTREMITY ANGIOGRAPHY Right 10/23/2018   Procedure: LOWER EXTREMITY ANGIOGRAPHY;  Surgeon: Algernon Huxley, MD;  Location: [facility];  Service: Cardiovascular;  Laterality: Right;    Prior to Admission medications   Medication Sig Start Date End Date Taking? Authorizing Provider  apixaban (ELIQUIS) 2.5 MG TABS tablet Take 1 tablet (2.5 mg total) by mouth 2 (two) times daily. 03/17/21  Yes Birdie Sons, MD  atorvastatin (LIPITOR) 20 MG tablet Take 20 mg by mouth daily. 09/04/19  Yes [provider]  blood glucose meter kit and supplies Dispense based on patient and insurance preference. Use once daily in the morning. (FOR ICD-10 E11.9). 11/10/18  Yes Pollak, Adriana M, PA-C  COMBIVENT RESPIMAT 20-100 MCG/ACT  AERS respimat Inhale 1 puff into the lungs every 6 (six) hours as needed. 04/09/20  Yes [provider]  cyanocobalamin 1000 MCG tablet Take 1,000 mcg by mouth daily.   Yes [provider]  latanoprost (XALATAN) 0.005 % ophthalmic solution Place 1 drop into both eyes at bedtime. 02/05/20  Yes [provider]  metFORMIN (GLUCOPHAGE XR) 500 MG 24 hr tablet Take 2 tablets (1,000 mg total) by mouth 2 (two) times daily. 03/17/21 06/10/22 Yes Birdie Sons, MD  ACCU-CHEK GUIDE test strip USE ONCE DAILY IN MORNING (E11.9) 03/05/19   [name], PA-C    Allergies as of 11/27/2021   (No Known Allergies)    Family History  Problem Relation Age of Onset   Parkinson's disease Mother    Dementia Father     Social History   Socioeconomic History   Marital status: Married    Spouse name: [name]    Number of children: 2   Years of education: Not on file   Highest education level: Bachelor's degree (e.g., BA, AB, BS)  Occupational History   Occupation: RETIRED   Tobacco Use   Smoking status: Former    Packs/day: 1.00    Years: 45.00    Total pack years: 45.00    Types: Cigarettes    Quit date: 12/15/2021    Years since quitting: 0.0   Smokeless tobacco: Never  Vaping Use  Vaping Use: Never used  Substance and Sexual Activity   Alcohol use: Not Currently    Comment: rare   Drug use: Not Currently    Types: Psilocybin   Sexual activity: Not on file  Other Topics Concern   Not on file  Social History Narrative   Not on file   Social Determinants of Health   Financial Resource Strain: Low Risk  (04/16/2021)   Overall Financial Resource Strain (CARDIA)    Difficulty of Paying Living Expenses: Not hard at all  Food Insecurity: No Food Insecurity (04/16/2021)   Hunger Vital Sign    Worried About Running Out of Food in the Last Year: Never true    [place] in the Last Year: Never true  Transportation Needs: No Transportation Needs (04/16/2021)    PRAPARE - Hydrologist (Medical): No    Lack of Transportation (Non-Medical): No  Physical Activity: Insufficiently Active (04/16/2021)   Exercise Vital Sign    Days of Exercise per Week: 2 days    Minutes of Exercise per Session: 10 min  Stress: No Stress Concern Present (04/16/2021)   [place]    Feeling of Stress : Not at all  Social Connections: Moderately Integrated (04/16/2021)   Social Connection and Isolation Panel [NHANES]    Frequency of Communication with Friends and Family: Twice a week    Frequency of Social Gatherings with Friends and Family: Once a week    Attends Religious Services: Never    Marine scientist or Organizations: Yes    Attends Archivist Meetings: Never    Marital Status: Married  Human resources officer Violence: Not At Risk (04/16/2021)   Humiliation, Afraid, Rape, and Kick questionnaire    Fear of Current or Ex-Partner: No    Emotionally Abused: No    Physically Abused: No    Sexually Abused: No    Review of Systems: See HPI, otherwise negative ROS  Physical Exam: BP (!) 181/78   Pulse 62   Temp 97.9 F (36.6 C) (Temporal)   Resp 18   Ht 6' (1.829 m)   Wt (!) 149.2 kg   SpO2 96%   BMI 44.62 kg/m  General:   Alert,  pleasant and cooperative in NAD Head:  Normocephalic and atraumatic. Lungs:  Clear to auscultation.    Heart:  Regular rate and rhythm.   Impression/Plan: Ronald Malone is here for ophthalmic surgery.  Risks, benefits, limitations, and alternatives regarding ophthalmic surgery have been reviewed with the patient.  Questions have been answered.  All parties agreeable.   Leandrew Koyanagi, MD  01/13/2022, 7:33 AM

## 2022-01-13 NOTE — Anesthesia Postprocedure Evaluation (Signed)
Anesthesia Post Note  Patient: Ronald Malone  Procedure(s) Performed: CATARACT EXTRACTION PHACO AND INTRAOCULAR LENS PLACEMENT (IOC) RIGHT 9.56 01:34.8 (Right: Eye)  Patient location during evaluation: PACU Anesthesia Type: MAC Level of consciousness: awake and alert Pain management: pain level controlled Vital Signs Assessment: post-procedure vital signs reviewed and stable Respiratory status: spontaneous breathing, nonlabored ventilation, respiratory function stable and patient connected to nasal cannula oxygen Cardiovascular status: blood pressure returned to baseline and stable Postop Assessment: no apparent nausea or vomiting Anesthetic complications: no   No notable events documented.   Last Vitals:  Vitals:   01/13/22 0824 01/13/22 0830  BP: (!) 186/95 (!) 147/92  Pulse: (!) 57 (!) 59  Resp: 17 16  Temp: 36.5 C   SpO2: 97% 97%    Last Pain:  Vitals:   01/13/22 0830  TempSrc:   PainSc: 0-No pain                 Molli Barrows

## 2022-01-14 ENCOUNTER — Encounter: Payer: Self-pay | Admitting: Ophthalmology

## 2022-01-15 NOTE — Telephone Encounter (Signed)
Patient came by the office requesting a new RX for Eliquis 5 mg instead of 2.5 mg.   He said it will be cheaper for him to get 5 mg. And cut them in 1/2 instead of taking 2.5 mg. Tabs bid.    He is TOTALLY out and needs these today.  [facility] [address].

## 2022-01-18 MED ORDER — APIXABAN 5 MG PO TABS: ORAL_TABLET | ORAL | 1 refills | Status: DC

## 2022-01-19 DIAGNOSIS — H2512 Age-related nuclear cataract, left eye: Secondary | ICD-10-CM | POA: Diagnosis not present

## 2022-01-19 NOTE — Telephone Encounter (Signed)
Medication sent to pharmacy yesterday - confirmed by CMA

## 2022-01-26 NOTE — Discharge Instructions (Signed)

## 2022-01-27 ENCOUNTER — Ambulatory Visit
Admission: RE | Admit: 2022-01-27 | Discharge: 2022-01-27 | Disposition: A | Discharge status: Home / Self Care | Payer: Medicare Other | Attending: Ophthalmology | Admitting: Ophthalmology

## 2022-01-27 ENCOUNTER — Ambulatory Visit: Payer: Medicare Other | Admitting: Anesthesiology

## 2022-01-27 ENCOUNTER — Other Ambulatory Visit: Payer: Self-pay

## 2022-01-27 ENCOUNTER — Encounter
Admission: RE | Disposition: A | Discharge status: Home / Self Care | Payer: Self-pay | Source: Home / Self Care | Attending: Ophthalmology

## 2022-01-27 DIAGNOSIS — Z6841 Body Mass Index (BMI) 40.0 and over, adult: Secondary | ICD-10-CM | POA: Diagnosis not present

## 2022-01-27 DIAGNOSIS — Z7984 Long term (current) use of oral hypoglycemic drugs: Secondary | ICD-10-CM | POA: Insufficient documentation

## 2022-01-27 DIAGNOSIS — G4730 Sleep apnea, unspecified: Secondary | ICD-10-CM | POA: Insufficient documentation

## 2022-01-27 DIAGNOSIS — Z955 Presence of coronary angioplasty implant and graft: Secondary | ICD-10-CM | POA: Insufficient documentation

## 2022-01-27 DIAGNOSIS — Z87891 Personal history of nicotine dependence: Secondary | ICD-10-CM | POA: Diagnosis not present

## 2022-01-27 DIAGNOSIS — H2512 Age-related nuclear cataract, left eye: Secondary | ICD-10-CM | POA: Insufficient documentation

## 2022-01-27 DIAGNOSIS — I10 Essential (primary) hypertension: Secondary | ICD-10-CM | POA: Insufficient documentation

## 2022-01-27 DIAGNOSIS — I2510 Atherosclerotic heart disease of native coronary artery without angina pectoris: Secondary | ICD-10-CM | POA: Insufficient documentation

## 2022-01-27 DIAGNOSIS — J439 Emphysema, unspecified: Secondary | ICD-10-CM | POA: Diagnosis not present

## 2022-01-27 DIAGNOSIS — E1136 Type 2 diabetes mellitus with diabetic cataract: Secondary | ICD-10-CM | POA: Insufficient documentation

## 2022-01-27 DIAGNOSIS — I252 Old myocardial infarction: Secondary | ICD-10-CM | POA: Insufficient documentation

## 2022-01-27 DIAGNOSIS — E1151 Type 2 diabetes mellitus with diabetic peripheral angiopathy without gangrene: Secondary | ICD-10-CM | POA: Diagnosis not present

## 2022-01-27 HISTORY — PX: CATARACT EXTRACTION W/PHACO: SHX586

## 2022-01-27 LAB — GLUCOSE, CAPILLARY: Glucose-Capillary: 127 mg/dL — ABNORMAL HIGH (ref 70–99)

## 2022-01-27 SURGERY — PHACOEMULSIFICATION, CATARACT, WITH IOL INSERTION
Anesthesia: Monitor Anesthesia Care | Site: Eye | Laterality: Left

## 2022-01-27 MED ORDER — BRIMONIDINE TARTRATE-TIMOLOL 0.2-0.5 % OP SOLN
OPHTHALMIC | Status: DC | PRN
  Administered 2022-01-27: 1 [drp] via OPHTHALMIC

## 2022-01-27 MED ORDER — FENTANYL CITRATE (PF) 100 MCG/2ML IJ SOLN
INTRAMUSCULAR | Status: DC | PRN
  Administered 2022-01-27 (×2): 50 ug via INTRAVENOUS

## 2022-01-27 MED ORDER — SIGHTPATH DOSE#1 BSS IO SOLN
INTRAOCULAR | Status: DC | PRN
  Administered 2022-01-27: 15 mL

## 2022-01-27 MED ORDER — CEFUROXIME OPHTHALMIC INJECTION 1 MG/0.1 ML
INJECTION | OPHTHALMIC | Status: DC | PRN
  Administered 2022-01-27: .1 mL via INTRACAMERAL

## 2022-01-27 MED ORDER — SIGHTPATH DOSE#1 NA HYALUR & NA CHOND-NA HYALUR IO KIT
PACK | INTRAOCULAR | Status: DC | PRN
  Administered 2022-01-27: 1 via OPHTHALMIC

## 2022-01-27 MED ORDER — MIDAZOLAM HCL 2 MG/2ML IJ SOLN
INTRAMUSCULAR | Status: DC | PRN
  Administered 2022-01-27: 2 mg via INTRAVENOUS

## 2022-01-27 MED ORDER — TETRACAINE HCL 0.5 % OP SOLN
1.0000 [drp] | OPHTHALMIC | Status: DC | PRN
  Administered 2022-01-27 (×3): 1 [drp] via OPHTHALMIC

## 2022-01-27 MED ORDER — SIGHTPATH DOSE#1 BSS IO SOLN
INTRAOCULAR | Status: DC | PRN
  Administered 2022-01-27: 1 mL via INTRAMUSCULAR

## 2022-01-27 MED ORDER — SIGHTPATH DOSE#1 BSS IO SOLN
INTRAOCULAR | Status: DC | PRN
  Administered 2022-01-27: 60 mL via OPHTHALMIC

## 2022-01-27 MED ORDER — ARMC OPHTHALMIC DILATING DROPS
1.0000 | OPHTHALMIC | Status: DC | PRN
  Administered 2022-01-27 (×3): 1 via OPHTHALMIC

## 2022-01-27 SURGICAL SUPPLY — 10 items
CATARACT SUITE SIGHTPATH (MISCELLANEOUS) ×1 IMPLANT
FEE CATARACT SUITE SIGHTPATH (MISCELLANEOUS) ×1 IMPLANT
GLOVE SRG 8 PF TXTR STRL LF DI (GLOVE) ×1 IMPLANT
GLOVE SURG ENC TEXT LTX SZ7.5 (GLOVE) ×1 IMPLANT
GLOVE SURG UNDER POLY LF SZ8 (GLOVE) ×1
LENS IOL TECNIS EYHANCE 19.5 (Intraocular Lens) IMPLANT
NDL FILTER BLUNT 18X1 1/2 (NEEDLE) ×1 IMPLANT
NEEDLE FILTER BLUNT 18X1 1/2 (NEEDLE) ×1 IMPLANT
SYR 3ML LL SCALE MARK (SYRINGE) ×1 IMPLANT
WATER STERILE IRR 250ML POUR (IV SOLUTION) ×1 IMPLANT

## 2022-01-27 NOTE — H&P (Signed)
[facility]   Primary Care Physician:  Birdie Sons, MD Ophthalmologist: Dr. Leandrew Koyanagi  Pre-Procedure History & Physical: HPI:  Ronald Malone is a 66 y.o. male here for ophthalmic surgery.   Past Medical History:  Diagnosis Date   CHF (congestive heart failure) (HCC)    Clotting disorder ([place])    Diabetes mellitus without complication ([place])    Emphysema lung ([place])    Hyperlipidemia    MI (myocardial infarction) ([place]) 2015   Peripheral neuropathy    Pulmonary embolism ([place]) 2020   Sleep apnea    CPAP   Torn meniscus    right   Wears dentures    full upper, partial lower   Wears hearing aid in right ear     Past Surgical History:  Procedure Laterality Date   CARDIAC CATHETERIZATION  2015   stent   CATARACT EXTRACTION W/PHACO Right 01/13/2022   Procedure: CATARACT EXTRACTION PHACO AND INTRAOCULAR LENS PLACEMENT ([place]) RIGHT 9.56 01:34.8;  Surgeon: Leandrew Koyanagi, MD;  Location: [facility];  Service: Ophthalmology;  Laterality: Right;  Diabetic   COLON SURGERY     1/3 colon removed as an infant   COLONOSCOPY WITH PROPOFOL N/A 12/26/2018   Procedure: COLONOSCOPY WITH PROPOFOL;  Surgeon: Lucilla Lame, MD;  Location: [facility] ENDOSCOPY;  Service: Endoscopy;  Laterality: N/A;   COLONOSCOPY WITH PROPOFOL N/A 04/01/2020   Procedure: COLONOSCOPY WITH PROPOFOL;  Surgeon: Lucilla Lame, MD;  Location: [facility] ENDOSCOPY;  Service: Endoscopy;  Laterality: N/A;   heart stent placed     LOWER EXTREMITY ANGIOGRAPHY Right 10/23/2018   Procedure: LOWER EXTREMITY ANGIOGRAPHY;  Surgeon: Algernon Huxley, MD;  Location: [facility];  Service: Cardiovascular;  Laterality: Right;    Prior to Admission medications   Medication Sig Start Date End Date Taking? Authorizing Provider  ACCU-CHEK GUIDE test strip USE ONCE DAILY IN MORNING (E11.9) 03/05/19  Yes [name] M, PA-C  apixaban (ELIQUIS) 5 MG TABS tablet Take 1/2 tablet twice every day 01/18/22  Yes  Birdie Sons, MD  atorvastatin (LIPITOR) 20 MG tablet Take 20 mg by mouth daily. 09/04/19  Yes [provider]  blood glucose meter kit and supplies Dispense based on patient and insurance preference. Use once daily in the morning. (FOR ICD-10 E11.9). 11/10/18  Yes Pollak, Adriana M, PA-C  COMBIVENT RESPIMAT 20-100 MCG/ACT AERS respimat Inhale 1 puff into the lungs every 6 (six) hours as needed. 04/09/20  Yes [provider]  cyanocobalamin 1000 MCG tablet Take 1,000 mcg by mouth daily.   Yes [provider]  latanoprost (XALATAN) 0.005 % ophthalmic solution Place 1 drop into both eyes at bedtime. 02/05/20  Yes [provider]  metFORMIN (GLUCOPHAGE XR) 500 MG 24 hr tablet Take 2 tablets (1,000 mg total) by mouth 2 (two) times daily. 03/17/21 06/10/22 Yes Birdie Sons, MD    Allergies as of 11/27/2021   (No Known Allergies)    Family History  Problem Relation Age of Onset   Parkinson's disease Mother    Dementia Father     Social History   Socioeconomic History   Marital status: Married    Spouse name: [name]    Number of children: 2   Years of education: Not on file   Highest education level: Bachelor's degree (e.g., BA, AB, BS)  Occupational History   Occupation: RETIRED   Tobacco Use   Smoking status: Former    Packs/day: 1.00    Years: 45.00  Total pack years: 45.00    Types: Cigarettes    Quit date: 12/15/2021    Years since quitting: 0.1   Smokeless tobacco: Never  Vaping Use   Vaping Use: Never used  Substance and Sexual Activity   Alcohol use: Not Currently    Comment: rare   Drug use: Not Currently    Types: Psilocybin   Sexual activity: Not on file  Other Topics Concern   Not on file  Social History Narrative   Not on file   Social Determinants of Health   Financial Resource Strain: Low Risk  (04/16/2021)   Overall Financial Resource Strain (CARDIA)    Difficulty of Paying Living Expenses: Not hard at all  Food  Insecurity: No Food Insecurity (04/16/2021)   Hunger Vital Sign    Worried About Running Out of Food in the Last Year: Never true    Ran Out of Food in the Last Year: Never true  Transportation Needs: No Transportation Needs (04/16/2021)   PRAPARE - Hydrologist (Medical): No    Lack of Transportation (Non-Medical): No  Physical Activity: Insufficiently Active (04/16/2021)   Exercise Vital Sign    Days of Exercise per Week: 2 days    Minutes of Exercise per Session: 10 min  Stress: No Stress Concern Present (04/16/2021)   [place]    Feeling of Stress : Not at all  Social Connections: Moderately Integrated (04/16/2021)   Social Connection and Isolation Panel [NHANES]    Frequency of Communication with Friends and Family: Twice a week    Frequency of Social Gatherings with Friends and Family: Once a week    Attends Religious Services: Never    Marine scientist or Organizations: Yes    Attends Archivist Meetings: Never    Marital Status: Married  Human resources officer Violence: Not At Risk (04/16/2021)   Humiliation, Afraid, Rape, and Kick questionnaire    Fear of Current or Ex-Partner: No    Emotionally Abused: No    Physically Abused: No    Sexually Abused: No    Review of Systems: See HPI, otherwise negative ROS  Physical Exam: BP (!) 169/74   Pulse 80   Temp 98.1 F (36.7 C) (Temporal)   Resp 16   Ht 6' (1.829 m)   Wt (!) 150.6 kg   SpO2 94%   BMI 45.03 kg/m  General:   Alert,  pleasant and cooperative in NAD Head:  Normocephalic and atraumatic. Lungs:  Clear to auscultation.    Heart:  Regular rate and rhythm.   Impression/Plan: Ronald Malone is here for ophthalmic surgery.  Risks, benefits, limitations, and alternatives regarding ophthalmic surgery have been reviewed with the patient.  Questions have been answered.  All parties  agreeable.   Leandrew Koyanagi, MD  01/27/2022, 1:40 PM

## 2022-01-27 NOTE — Transfer of Care (Signed)
Immediate Anesthesia Transfer of Care Note  Patient: Ronald Malone  Procedure(s) Performed: CATARACT EXTRACTION PHACO AND INTRAOCULAR LENS PLACEMENT (IOC) LEFT 3.42  00:52.2 (Left: Eye)  Patient Location: PACU  Anesthesia Type: MAC  Level of Consciousness: awake, alert  and patient cooperative  Airway and Oxygen Therapy: Patient Spontanous Breathing and Patient connected to supplemental oxygen  Post-op Assessment: Post-op Vital signs reviewed, Patient's Cardiovascular Status Stable, Respiratory Function Stable, Patent Airway and No signs of Nausea or vomiting  Post-op Vital Signs: Reviewed and stable  Complications: No notable events documented.

## 2022-01-27 NOTE — Anesthesia Postprocedure Evaluation (Signed)
Anesthesia Post Note  Patient: CAYDYN SPRUNG  Procedure(s) Performed: CATARACT EXTRACTION PHACO AND INTRAOCULAR LENS PLACEMENT (IOC) LEFT 3.42  00:52.2 (Left: Eye)  Patient location during evaluation: PACU Anesthesia Type: MAC Level of consciousness: awake and alert Pain management: pain level controlled Vital Signs Assessment: post-procedure vital signs reviewed and stable Respiratory status: spontaneous breathing, nonlabored ventilation, respiratory function stable and patient connected to nasal cannula oxygen Cardiovascular status: stable and blood pressure returned to baseline Postop Assessment: no apparent nausea or vomiting Anesthetic complications: no  No notable events documented.   Last Vitals:  Vitals:   01/27/22 1428 01/27/22 1430  BP: (!) 160/93 (!) 160/93  Pulse: 72 73  Resp: (!) 24 20  Temp: 36.5 C   SpO2: 95% 96%    Last Pain:  Vitals:   01/27/22 1428  TempSrc:   PainSc: 0-No pain                 Ilene Qua

## 2022-01-27 NOTE — Op Note (Signed)
  OPERATIVE NOTE  [name] [id number] 01/27/2022   PREOPERATIVE DIAGNOSIS:  Nuclear sclerotic cataract left eye. H25.12   POSTOPERATIVE DIAGNOSIS:    Nuclear sclerotic cataract left eye.     PROCEDURE:  Phacoemusification with posterior chamber intraocular lens placement of the left eye  Ultrasound time: Procedure(s) with comments: CATARACT EXTRACTION PHACO AND INTRAOCULAR LENS PLACEMENT (IOC) LEFT 3.42  00:52.2 (Left) - Diabetic  LENS:   Implant Name Type Inv. Item Serial No. Manufacturer Lot No. LRB No. Used Action  LENS IOL TECNIS EYHANCE 19.5 - [id number] Intraocular Lens LENS IOL TECNIS EYHANCE 19.5 [id number] [employer]  Left 1 Implanted      SURGEON:  Wyonia Hough, MD   ANESTHESIA:  Topical with tetracaine drops and 2% Xylocaine jelly, augmented with 1% preservative-free intracameral lidocaine.    COMPLICATIONS:  None.   DESCRIPTION OF PROCEDURE:  The patient was identified in the holding room and transported to the operating room and placed in the supine position under the operating microscope.  The left eye was identified as the operative eye and it was prepped and draped in the usual sterile ophthalmic fashion.   A 1 millimeter clear-corneal paracentesis was made at the 1:30 position.  0.5 ml of preservative-free 1% lidocaine was injected into the anterior chamber.  The anterior chamber was filled with Viscoat viscoelastic.  A 2.4 millimeter keratome was used to make a near-clear corneal incision at the 10:30 position.  .  A curvilinear capsulorrhexis was made with a cystotome and capsulorrhexis forceps.  Balanced salt solution was used to hydrodissect and hydrodelineate the nucleus.   Phacoemulsification was then used in stop and chop fashion to remove the lens nucleus and epinucleus.  The remaining cortex was then removed using the irrigation and aspiration handpiece. Provisc was then placed into the capsular bag to distend it for lens placement.  A lens  was then injected into the capsular bag.  The remaining viscoelastic was aspirated.   Wounds were hydrated with balanced salt solution.  The anterior chamber was inflated to a physiologic pressure with balanced salt solution.  No wound leaks were noted. Cefuroxime 0.1 ml of a '10mg'$ /ml solution was injected into the anterior chamber for a dose of 1 mg of intracameral antibiotic at the completion of the case.   Timolol and Brimonidine drops were applied to the eye.  The patient was taken to the recovery room in stable condition without complications of anesthesia or surgery.  Ronald Malone 01/27/2022, 2:27 PM

## 2022-01-27 NOTE — Anesthesia Preprocedure Evaluation (Signed)
Anesthesia Evaluation  Patient identified by MRN, date of birth, ID band Patient awake  General Assessment Comment:  At 2022 colonoscopy, documented airway obstruction requiring nasal trumpet and jaw thrust for resolution.  Reviewed: Allergy & Precautions, H&P , NPO status , Patient's Chart, lab work & pertinent test results, reviewed documented beta blocker date and time   Airway Mallampati: III  TM Distance: >3 FB Neck ROM: full    Dental no notable dental hx. (+) Teeth Intact   Pulmonary neg pulmonary ROS, sleep apnea , COPD, former smoker   Pulmonary exam normal breath sounds clear to auscultation       Cardiovascular Exercise Tolerance: Poor hypertension, + CAD, + Past MI, + Cardiac Stents, + Peripheral Vascular Disease and +CHF  negative cardio ROS  Rhythm:regular Rate:Normal     Neuro/Psych  Neuromuscular disease negative neurological ROS  negative psych ROS   GI/Hepatic negative GI ROS, Neg liver ROS,,,  Endo/Other  negative endocrine ROSdiabetes, Well Controlled, Type 2, Oral Hypoglycemic Agents  Morbid obesity  Renal/GU      Musculoskeletal   Abdominal   Peds  Hematology negative hematology ROS (+)   Anesthesia Other Findings   Reproductive/Obstetrics negative OB ROS                             Anesthesia Physical Anesthesia Plan  ASA: 3  Anesthesia Plan: MAC   Post-op Pain Management: Minimal or no pain anticipated   Induction: Intravenous  PONV Risk Score and Plan:   Airway Management Planned:   Additional Equipment:   Intra-op Plan:   Post-operative Plan:   Informed Consent: I have reviewed the patients History and Physical, chart, labs and discussed the procedure including the risks, benefits and alternatives for the proposed anesthesia with the patient or authorized representative who has indicated his/her understanding and acceptance.       Plan  Discussed with: CRNA  Anesthesia Plan Comments:         Anesthesia Quick Evaluation

## 2022-01-28 ENCOUNTER — Encounter: Payer: Self-pay | Admitting: Ophthalmology

## 2022-04-20 ENCOUNTER — Ambulatory Visit (INDEPENDENT_AMBULATORY_CARE_PROVIDER_SITE_OTHER): Payer: Medicare Other

## 2022-04-20 VITALS — Ht 72.0 in | Wt 325.0 lb

## 2022-04-20 DIAGNOSIS — Z0000 Encounter for general adult medical examination without abnormal findings: Secondary | ICD-10-CM

## 2022-04-20 NOTE — Patient Instructions (Signed)
[name] , Thank you for taking time to come for your Medicare Wellness Visit. I appreciate your ongoing commitment to your health goals. Please review the following plan we discussed and let me know if I can assist you in the future.   These are the goals we discussed:  Goals       DIET - EAT MORE FRUITS AND VEGETABLES      Exercise for Diabetes (pt-stated)      Current Barriers:  Knowledge Deficits related to basic Diabetes pathophysiology and self care/management   Case Manager Clinical Goal(s):  Over the next 60 days, patient will demonstrate improved adherence to prescribed treatment plan for diabetes self care/management as evidenced by:  daily monitoring and recording of CBG  adherence to ADA/ carb modified diet exercise 3 days/week adherence to prescribed medication regimen  Interventions:  Provided education to patient about basic DM disease process Provided patient with written educational materials related to hypo and hyperglycemia and importance of correct treatment Counseling on ADA diet Silver Sneakers/gym benefits of [facility] AARP plan  Patient Self Care Activities:  Checks blood sugars as prescribed and utilize hyper and hypoglycemia protocol as needed Adheres to prescribed ADA/carb modified diet  Initial goal documentation       Patient Stated      No new goals      Quit Smoking      Recommend to continue efforts to reduce smoking habits until no longer smoking.         This is a list of the screening recommended for you and due dates:  Health Maintenance  Topic Date Due   Hemoglobin A1C  09/14/2021   Yearly kidney function blood test for diabetes  03/17/2022   Yearly kidney health urinalysis for diabetes  03/17/2022   COVID-19 Vaccine (7 - 2023-24 season) 12/14/2022*   Screening for Lung Cancer  07/30/2022   Eye exam for diabetics  10/27/2022   Medicare Annual Wellness Visit  04/21/2023   Colon Cancer Screening  04/01/2025   DTaP/Tdap/Td vaccine (2  - Td or Tdap) 09/17/2029   Pneumonia Vaccine  Completed   Flu Shot  Completed   Hepatitis C Screening: USPSTF Recommendation to screen - Ages 3-79 yo.  Completed   Zoster (Shingles) Vaccine  Completed   HPV Vaccine  Aged Out  *Topic was postponed. The date shown is not the original due date.    Advanced directives: no, can get paperwork at office.  Conditions/risks identified: Smoking, Sedentary lifestyle  Next appointment: Follow up in one year for your annual wellness visit. 04/26/2023 @ 10:30 via telephone.  Preventive Care 45 Years and Older, Male  Preventive care refers to lifestyle choices and visits with your health care provider that can promote health and wellness. What does preventive care include? A yearly physical exam. This is also called an annual well check. Dental exams once or twice a year. Routine eye exams. Ask your health care provider how often you should have your eyes checked. Personal lifestyle choices, including: Daily care of your teeth and gums. Regular physical activity. Eating a healthy diet. Avoiding tobacco and drug use. Limiting alcohol use. Practicing safe sex. Taking low doses of aspirin every day. Taking vitamin and mineral supplements as recommended by your health care provider. What happens during an annual well check? The services and screenings done by your health care provider during your annual well check will depend on your age, overall health, lifestyle risk factors, and family history of disease. Counseling  Your health care provider may ask you questions about your: Alcohol use. Tobacco use. Drug use. Emotional well-being. Home and relationship well-being. Sexual activity. Eating habits. History of falls. Memory and ability to understand (cognition). Work and work Statistician. Screening  You may have the following tests or measurements: Height, weight, and BMI. Blood pressure. Lipid and cholesterol levels. These may be  checked every 5 years, or more frequently if you are over 74 years old. Skin check. Lung cancer screening. You may have this screening every year starting at age 22 if you have a 30-pack-year history of smoking and currently smoke or have quit within the past 15 years. Fecal occult blood test (FOBT) of the stool. You may have this test every year starting at age 60. Flexible sigmoidoscopy or colonoscopy. You may have a sigmoidoscopy every 5 years or a colonoscopy every 10 years starting at age 57. Prostate cancer screening. Recommendations will vary depending on your family history and other risks. Hepatitis C blood test. Hepatitis B blood test. Sexually transmitted disease (STD) testing. Diabetes screening. This is done by checking your blood sugar (glucose) after you have not eaten for a while (fasting). You may have this done every 1-3 years. Abdominal aortic aneurysm (AAA) screening. You may need this if you are a current or former smoker. Osteoporosis. You may be screened starting at age 54 if you are at high risk. Talk with your health care provider about your test results, treatment options, and if necessary, the need for more tests. Vaccines  Your health care provider may recommend certain vaccines, such as: Influenza vaccine. This is recommended every year. Tetanus, diphtheria, and acellular pertussis (Tdap, Td) vaccine. You may need a Td booster every 10 years. Zoster vaccine. You may need this after age 78. Pneumococcal 13-valent conjugate (PCV13) vaccine. One dose is recommended after age 52. Pneumococcal polysaccharide (PPSV23) vaccine. One dose is recommended after age 16. Talk to your health care provider about which screenings and vaccines you need and how often you need them. This information is not intended to replace advice given to you by your health care provider. Make sure you discuss any questions you have with your health care provider. Document Released: 03/28/2015  Document Revised: 11/19/2015 Document Reviewed: 12/31/2014 Elsevier Interactive Patient Education  2017 [place] Prevention in the Home Falls can cause injuries. They can happen to people of all ages. There are many things you can do to make your home safe and to help prevent falls. What can I do on the outside of my home? Regularly fix the edges of walkways and driveways and fix any cracks. Remove anything that might make you trip as you walk through a door, such as a raised step or threshold. Trim any bushes or trees on the path to your home. Use bright outdoor lighting. Clear any walking paths of anything that might make someone trip, such as rocks or tools. Regularly check to see if handrails are loose or broken. Make sure that both sides of any steps have handrails. Any raised decks and porches should have guardrails on the edges. Have any leaves, snow, or ice cleared regularly. Use sand or salt on walking paths during winter. Clean up any spills in your garage right away. This includes oil or grease spills. What can I do in the bathroom? Use night lights. Install grab bars by the toilet and in the tub and shower. Do not use towel bars as grab bars. Use non-skid mats or decals  in the tub or shower. If you need to sit down in the shower, use a plastic, non-slip stool. Keep the floor dry. Clean up any water that spills on the floor as soon as it happens. Remove soap buildup in the tub or shower regularly. Attach bath mats securely with double-sided non-slip rug tape. Do not have throw rugs and other things on the floor that can make you trip. What can I do in the bedroom? Use night lights. Make sure that you have a light by your bed that is easy to reach. Do not use any sheets or blankets that are too big for your bed. They should not hang down onto the floor. Have a firm chair that has side arms. You can use this for support while you get dressed. Do not have throw rugs  and other things on the floor that can make you trip. What can I do in the kitchen? Clean up any spills right away. Avoid walking on wet floors. Keep items that you use a lot in easy-to-reach places. If you need to reach something above you, use a strong step stool that has a grab bar. Keep electrical cords out of the way. Do not use floor polish or wax that makes floors slippery. If you must use wax, use non-skid floor wax. Do not have throw rugs and other things on the floor that can make you trip. What can I do with my stairs? Do not leave any items on the stairs. Make sure that there are handrails on both sides of the stairs and use them. Fix handrails that are broken or loose. Make sure that handrails are as long as the stairways. Check any carpeting to make sure that it is firmly attached to the stairs. Fix any carpet that is loose or worn. Avoid having throw rugs at the top or bottom of the stairs. If you do have throw rugs, attach them to the floor with carpet tape. Make sure that you have a light switch at the top of the stairs and the bottom of the stairs. If you do not have them, ask someone to add them for you. What else can I do to help prevent falls? Wear shoes that: Do not have high heels. Have rubber bottoms. Are comfortable and fit you well. Are closed at the toe. Do not wear sandals. If you use a stepladder: Make sure that it is fully opened. Do not climb a closed stepladder. Make sure that both sides of the stepladder are locked into place. Ask someone to hold it for you, if possible. Clearly mark and make sure that you can see: Any grab bars or handrails. First and last steps. Where the edge of each step is. Use tools that help you move around (mobility aids) if they are needed. These include: Canes. Walkers. Scooters. Crutches. Turn on the lights when you go into a dark area. Replace any light bulbs as soon as they burn out. Set up your furniture so you have a  clear path. Avoid moving your furniture around. If any of your floors are uneven, fix them. If there are any pets around you, be aware of where they are. Review your medicines with your doctor. Some medicines can make you feel dizzy. This can increase your chance of falling. Ask your doctor what other things that you can do to help prevent falls. This information is not intended to replace advice given to you by your health care provider. Make sure you  discuss any questions you have with your health care provider. Document Released: 12/26/2008 Document Revised: 08/07/2015 Document Reviewed: 04/05/2014 Elsevier Interactive Patient Education  2017 [employer].

## 2022-04-20 NOTE — Progress Notes (Signed)
Virtual Visit via Telephone Note  I connected with  Ronald Malone on 04/20/22 at 10:45 AM EST by telephone and verified that I am speaking with the correct person using two identifiers.  Location: Patient: Home Provider: Office Persons participating in the virtual visit: patient/Nurse Health Advisor   I discussed the limitations, risks, security and privacy concerns of performing an evaluation and management service by telephone and the availability of in person appointments. The patient expressed understanding and agreed to proceed.  Interactive audio and video telecommunications were attempted between this nurse and patient, however failed, due to patient having technical difficulties OR patient did not have access to video capability.  We continued and completed visit with audio only.  Some vital signs may be absent or patient reported.   Yazeed Pryer Moody Bruins, LPN  Subjective:   Ronald Malone is a 67 y.o. male who presents for Medicare Annual/Subsequent preventive examination.  Review of Systems     Cardiac Risk Factors include: advanced age (>29mn, >>61women);diabetes mellitus;obesity (BMI >30kg/m2);sedentary lifestyle;smoking/ tobacco exposure     Objective:    Today's Vitals   04/20/22 1043  Weight: (!) 325 lb (147.4 kg)  Height: 6' (1.829 m)   Body mass index is 44.08 kg/m.     04/20/2022   10:56 AM 01/13/2022    6:54 AM 04/16/2021    9:48 AM 04/10/2020    9:50 AM 04/01/2020    9:10 AM 02/19/2020   10:33 AM 09/06/2019    7:46 PM  Advanced Directives  Does Patient Have a Medical Advance Directive? No No No No No No No  Would patient like information on creating a medical advance directive? No - Patient declined No - Patient declined No - Patient declined No - Patient declined No - Patient declined  No - Patient declined    Current Medications (verified) Outpatient Encounter Medications as of 04/20/2022  Medication Sig   ACCU-CHEK GUIDE test strip USE ONCE DAILY  IN MORNING (E11.9)   atorvastatin (LIPITOR) 20 MG tablet Take 20 mg by mouth daily.   blood glucose meter kit and supplies Dispense based on patient and insurance preference. Use once daily in the morning. (FOR ICD-10 E11.9).   COMBIVENT RESPIMAT 20-100 MCG/ACT AERS respimat Inhale 1 puff into the lungs every 6 (six) hours as needed.   cyanocobalamin 1000 MCG tablet Take 1,000 mcg by mouth daily.   metFORMIN (GLUCOPHAGE XR) 500 MG 24 hr tablet Take 2 tablets (1,000 mg total) by mouth 2 (two) times daily.   apixaban (ELIQUIS) 5 MG TABS tablet Take 1/2 tablet twice every day (Patient not taking: Reported on 04/20/2022)   latanoprost (XALATAN) 0.005 % ophthalmic solution Place 1 drop into both eyes at bedtime. (Patient not taking: Reported on 04/20/2022)   No facility-administered encounter medications on file as of 04/20/2022.    Allergies (verified) Patient has no known allergies.   History: Past Medical History:  Diagnosis Date   Cataract 9/22   CHF (congestive heart failure) (HCC)    Clotting disorder (HCC)    Diabetes mellitus without complication (HCC)    Emphysema lung (HCC)    Emphysema of lung ([facility] 6/21   Hyperlipidemia    [facility]   Peripheral neuropathy    Pulmonary embolism ([facility] 2020   Sleep apnea    CPAP   Torn meniscus    right   Wears dentures    full upper, partial lower   Wears hearing aid in right ear  Past Surgical History:  Procedure Laterality Date   CARDIAC CATHETERIZATION  2015   stent   CATARACT EXTRACTION W/PHACO Right 01/13/2022   Procedure: CATARACT EXTRACTION PHACO AND INTRAOCULAR LENS PLACEMENT ([place]) RIGHT 9.56 01:34.8;  Surgeon: Leandrew Koyanagi, MD;  Location: [place];  Service: Ophthalmology;  Laterality: Right;  Diabetic   CATARACT EXTRACTION W/PHACO Left 01/27/2022   Procedure: CATARACT EXTRACTION PHACO AND INTRAOCULAR LENS PLACEMENT ([facility]) LEFT 3.42  00:52.2;  Surgeon: Leandrew Koyanagi, MD;   Location: [place];  Service: Ophthalmology;  Laterality: Left;  Diabetic   COLON SURGERY     1/3 colon removed as an infant   COLONOSCOPY WITH PROPOFOL N/A 12/26/2018   Procedure: COLONOSCOPY WITH PROPOFOL;  Surgeon: Lucilla Lame, MD;  Location: [facility] ENDOSCOPY;  Service: Endoscopy;  Laterality: N/A;   COLONOSCOPY WITH PROPOFOL N/A 04/01/2020   Procedure: COLONOSCOPY WITH PROPOFOL;  Surgeon: Lucilla Lame, MD;  [facility] ENDOSCOPY;  Service: Endoscopy;  Laterality: N/A;   heart stent placed     LOWER EXTREMITY ANGIOGRAPHY Right 10/23/2018   Procedure: LOWER EXTREMITY ANGIOGRAPHY;  Surgeon: Algernon Huxley, MD;  Location: [facility];  Service: Cardiovascular;  Laterality: Right;   SMALL INTESTINE SURGERY  1955   Family History  Problem Relation Age of Onset   Parkinson's disease Mother    Dementia Father    Social History   Socioeconomic History   Marital status: Married    Spouse name: [name]    Number of children: 2   Years of education: Not on file   Highest education level: Bachelor's degree (e.g., BA, AB, BS)  Occupational History   Occupation: RETIRED   Tobacco Use   Smoking status: Every Day    Packs/day: 2.00    Years: 45.00    Total pack years: 90.00    Types: Cigarettes    Last attempt to quit: 12/15/2021    Years since quitting: 0.3   Smokeless tobacco: Never  Vaping Use   Vaping Use: Never used  Substance and Sexual Activity   Alcohol use: Yes    Alcohol/week: 2.0 standard drinks of alcohol    Types: 2 Cans of beer per week    Comment: rare   Drug use: Not Currently   Sexual activity: Not Currently    Birth control/protection: None  Other Topics Concern   Not on file  Social History Narrative   Not on file   Social Determinants of Health   Financial Resource Strain: Low Risk  (04/20/2022)   Overall Financial Resource Strain (CARDIA)    Difficulty of Paying Living Expenses: Not hard at all  Food Insecurity: No Food Insecurity  (04/20/2022)   Hunger Vital Sign    Worried About Running Out of Food in the Last Year: Never true    Ran Out of Food in the Last Year: Never true  Transportation Needs: No Transportation Needs (04/20/2022)   PRAPARE - Hydrologist (Medical): No    Lack of Transportation (Non-Medical): No  Physical Activity: Inactive (04/20/2022)   Exercise Vital Sign    Days of Exercise per Week: 0 days    Minutes of Exercise per Session: 0 min  Stress: No Stress Concern Present (04/20/2022)   [place]    Feeling of Stress : Not at all  Social Connections: Moderately Isolated (04/20/2022)   Social Connection and Isolation Panel [NHANES]    Frequency of Communication with Friends and Family:  Once a week    Frequency of Social Gatherings with Friends and Family: Once a week    Attends Religious Services: Never    Marine scientist or Organizations: Yes    Attends Music therapist: 1 to 4 times per year    Marital Status: Married    Tobacco Counseling Ready to quit: No Counseling given: Yes   Clinical Intake:  Pre-visit preparation completed: Yes  Pain : No/denies pain     BMI - recorded: 44.08 Nutritional Risks: None Diabetes: Yes CBG done?: No Did pt. bring in CBG monitor from home?: No  How often do you need to have someone help you when you read instructions, pamphlets, or other written materials from your doctor or pharmacy?: 1 - Never  Diabetic?Nutrition Risk Assessment:  Has the patient had any N/V/D within the last 2 months?  No  Does the patient have any non-healing wounds?  No  Has the patient had any unintentional weight loss or weight gain?  No   Diabetes:  Is the patient diabetic?  Yes  If diabetic, was a CBG obtained today?  No  Did the patient bring in their glucometer from home?  No  How often do you monitor your CBG's? Does not monitor.   Financial Strains  and Diabetes Management:  Are you having any financial strains with the device, your supplies or your medication? No .  Does the patient want to be seen by Chronic Care Management for management of their diabetes?  No  Would the patient like to be referred to a Nutritionist or for Diabetic Management?  No   Diabetic Exams:  Diabetic Eye Exam: Completed 10/26/2021 Diabetic Foot Exam: Completed [employer] sends nurse to home to perform, otherwise followed by Dr.Fisher.    Interpreter Needed?: No  Information entered by :: C.Kayen Grabel LPN   Activities of Daily Living    04/20/2022   10:59 AM 04/16/2022    9:28 AM  In your present state of health, do you have any difficulty performing the following activities:  Hearing? 0 0  Vision? 0 0  Difficulty concentrating or making decisions? 0 0  Walking or climbing stairs? 1 1  Comment Due to Emphysema   Dressing or bathing? 1 1  Comment Due to brreathing issue   Doing errands, shopping? 1 0  Comment Due to breathing issue   Preparing Food and eating ? N N  Using the Toilet? N N  In the past six months, have you accidently leaked urine? Y Y  Comment occasional   Do you have problems with loss of bowel control? N N  Managing your Medications? N N  Managing your Finances? N N  Housekeeping or managing your Housekeeping? N N    Patient Care Team: Birdie Sons, MD as PCP - General (Family Medicine) Lucilla Lame, MD as Consulting Physician (Gastroenterology) Erby Pian, MD as Referring Physician (Specialist) Yolonda Kida, MD as Consulting Physician (Cardiology) Dingeldein, Remo Lipps, MD (Ophthalmology) Lucky Cowboy Erskine Squibb, MD as Referring Physician (Vascular Surgery) [place], [place] (Optometry)  Indicate any recent Medical Services you may have received from other than [facility] providers in the past year (date may be approximate).     Assessment:   This is a routine wellness examination for Ronald Malone.  Hearing/Vision  screen Hearing Screening - Comments:: Wears aids - [facility] Vision Screening - Comments:: Wear readers - Dr.Dingledein, Dr.Brasington  Dietary issues and exercise activities discussed: Current  Exercise Habits: The patient does not participate in regular exercise at present (due to knee injury), Exercise limited by: orthopedic condition(s)   Goals Addressed             This Visit's Progress    Patient Stated       No new goals       Depression Screen    04/20/2022   10:51 AM 04/16/2021    9:46 AM 07/11/2020   11:27 AM 07/01/2020    9:46 AM 04/10/2020    9:45 AM 02/19/2020    9:27 AM 10/18/2018   10:01 AM  PHQ 2/9 Scores  PHQ - 2 Score 0 0 0 0 0 0 0  PHQ- 9 Score    0  6 1    Fall Risk    04/20/2022   10:59 AM 04/16/2022    9:28 AM 04/16/2021    9:49 AM 04/12/2021    9:46 AM 07/11/2020   11:21 AM  Fall Risk   Falls in the past year? 0 0 0 0 0  Number falls in past yr: 0 0 0 0 0  Injury with Fall? 0 0 0 0   Risk for fall due to : No Fall Risks  No Fall Risks    Follow up Falls prevention discussed;Falls evaluation completed  Falls evaluation completed  Falls prevention discussed    FALL RISK PREVENTION PERTAINING TO THE HOME:  Any stairs in or around the home? Yes  If so, are there any without handrails? Yes  Home free of loose throw rugs in walkways, pet beds, electrical cords, etc? Yes  Adequate lighting in your home to reduce risk of falls? Yes   ASSISTIVE DEVICES UTILIZED TO PREVENT FALLS:  Life alert? No  Use of a cane, walker or w/c? No  Grab bars in the bathroom? Yes  Shower chair or bench in shower? No  Elevated toilet seat or a handicapped toilet? No    Cognitive Function:        04/20/2022   11:14 AM  6CIT Screen  What Year? 0 points  What month? 0 points  What time? 0 points  Count back from 20 0 points  Months in reverse 0 points  Repeat phrase 0 points  Total Score 0 points    Immunizations Immunization History   Administered Date(s) Administered   Covid-19, Mrna,Vaccine(Spikevax)54yr and older 02/03/2022   Fluad Quad(high Dose 65+) 01/02/2019, 01/23/2020, 12/30/2021   Influenza, High Dose Seasonal PF 12/16/2020   Moderna Covid-19 Vaccine Bivalent Booster 165yr& up 12/16/2020, 10/07/2021   Moderna Sars-Covid-2 Vaccination 03/28/2019, 04/25/2019, 01/07/2020   Pneumococcal Conjugate-13 10/18/2018   Pneumococcal Polysaccharide-23 03/24/2020   Tdap 09/18/2019   Zoster Recombinat (Shingrix) 02/11/2020, 04/24/2020    TDAP status: Up to date  Flu Vaccine status: Up to date  Pneumococcal vaccine status: Up to date  Covid-19 vaccine status: Completed vaccines  Qualifies for Shingles Vaccine? Yes   Zostavax completed No   Shingrix Completed?: Yes  Screening Tests Health Maintenance  Topic Date Due   HEMOGLOBIN A1C  09/14/2021   Diabetic kidney evaluation - eGFR measurement  03/17/2022   Diabetic kidney evaluation - Urine ACR  03/17/2022   COVID-19 Vaccine (7 - 2023-24 season) 12/14/2022 (Originally 03/31/2022)   Lung Cancer Screening  07/30/2022   OPHTHALMOLOGY EXAM  10/27/2022   Medicare Annual Wellness (AWV)  04/21/2023   COLONOSCOPY (Pts 45-4978yrnsurance coverage will need to be confirmed)  04/01/2025   DTaP/Tdap/Td (2 - Td or  Tdap) 09/17/2029   Pneumonia Vaccine 55+ Years old  Completed   INFLUENZA VACCINE  Completed   Hepatitis C Screening  Completed   Zoster Vaccines- Shingrix  Completed   HPV VACCINES  Aged Out    Health Maintenance  Health Maintenance Due  Topic Date Due   HEMOGLOBIN A1C  09/14/2021   Diabetic kidney evaluation - eGFR measurement  03/17/2022   Diabetic kidney evaluation - Urine ACR  03/17/2022    Colorectal cancer screening: Type of screening: Colonoscopy. Completed 04/01/2020. Repeat every 5 years  Lung Cancer Screening: (Low Dose CT Chest recommended if Age 7-80 years, 30 pack-year currently smoking OR have quit w/in 15years.) does qualify. Last  screening 07/29/2021  Lung Cancer Screening Referral: Patient declined, followed by pulmonologist.  Additional Screening:  Hepatitis C Screening: does not qualify; Completed 10/18/2018  Vision Screening: Recommended annual ophthalmology exams for early detection of glaucoma and other disorders of the eye. Is the patient up to date with their annual eye exam?  Yes  Who is the provider or what is the name of the office in which the patient attends annual eye exams? Dr.Dingledein If pt is not established with a provider, would they like to be referred to a provider to establish care? No .   Dental Screening: Recommended annual dental exams for proper oral hygiene  Community Resource Referral / Chronic Care Management: CRR required this visit?  No   CCM required this visit?  No      Plan:     I have personally reviewed and noted the following in the patient's chart:   Medical and social history Use of alcohol, tobacco or illicit drugs  Current medications and supplements including opioid prescriptions. Patient is not currently taking opioid prescriptions. Functional ability and status Nutritional status Physical activity Advanced directives List of other physicians Hospitalizations, surgeries, and ER visits in previous 12 months Vitals Screenings to include cognitive, depression, and falls Referrals and appointments  In addition, I have reviewed and discussed with patient certain preventive protocols, quality metrics, and best practice recommendations. A written personalized care plan for preventive services as well as general preventive health recommendations were provided to patient.     Lebron Conners, LPN   07/18/8125   Nurse Notes: Pt currently smoking declined Lung screening referral today, states will wait until he sees pulmonologist.

## 2022-05-06 DIAGNOSIS — F17210 Nicotine dependence, cigarettes, uncomplicated: Secondary | ICD-10-CM | POA: Diagnosis not present

## 2022-05-06 DIAGNOSIS — J449 Chronic obstructive pulmonary disease, unspecified: Secondary | ICD-10-CM | POA: Diagnosis not present

## 2022-05-06 DIAGNOSIS — G4733 Obstructive sleep apnea (adult) (pediatric): Secondary | ICD-10-CM | POA: Diagnosis not present

## 2022-05-06 DIAGNOSIS — J439 Emphysema, unspecified: Secondary | ICD-10-CM | POA: Diagnosis not present

## 2022-05-06 DIAGNOSIS — J301 Allergic rhinitis due to pollen: Secondary | ICD-10-CM | POA: Diagnosis not present

## 2022-05-19 ENCOUNTER — Other Ambulatory Visit: Payer: Self-pay | Admitting: *Deleted

## 2022-05-19 DIAGNOSIS — F17210 Nicotine dependence, cigarettes, uncomplicated: Secondary | ICD-10-CM

## 2022-05-19 DIAGNOSIS — Z122 Encounter for screening for malignant neoplasm of respiratory organs: Secondary | ICD-10-CM

## 2022-05-19 DIAGNOSIS — Z87891 Personal history of nicotine dependence: Secondary | ICD-10-CM

## 2022-05-25 ENCOUNTER — Other Ambulatory Visit: Payer: Self-pay | Admitting: Family Medicine

## 2022-05-25 DIAGNOSIS — R079 Chest pain, unspecified: Secondary | ICD-10-CM

## 2022-05-25 DIAGNOSIS — I82431 Acute embolism and thrombosis of right popliteal vein: Secondary | ICD-10-CM

## 2022-05-25 NOTE — Telephone Encounter (Signed)
Patient needs refills on NTG SL 0.4 mg. Tabs sent to [facility] [address].  He only wants 1 bottle of these.   His refills have expired.  And also patient wants to go back to getting 2.5 mg. Eliquis instead of 5 mg. (Cutting them in 1/2) He said he takes 2.5 mg. Every  a.m. and p.m.

## 2022-05-26 ENCOUNTER — Other Ambulatory Visit: Payer: Self-pay

## 2022-05-27 ENCOUNTER — Other Ambulatory Visit: Payer: Self-pay

## 2022-05-27 DIAGNOSIS — R079 Chest pain, unspecified: Secondary | ICD-10-CM

## 2022-05-27 DIAGNOSIS — I82431 Acute embolism and thrombosis of right popliteal vein: Secondary | ICD-10-CM

## 2022-05-27 NOTE — Telephone Encounter (Signed)
NTG not on list since 2022

## 2022-05-27 NOTE — Addendum Note (Signed)
Addended by: Althea Charon D on: 05/27/2022 02:59 PM   Modules accepted: Orders

## 2022-05-27 NOTE — Progress Notes (Unsigned)
Last time patient got NTG was in 2022.  Last note on Eliquis states patient not taking.   I have been unable to reach the patient to confirm this information

## 2022-05-28 ENCOUNTER — Other Ambulatory Visit: Payer: Self-pay | Admitting: Family Medicine

## 2022-05-28 DIAGNOSIS — I82431 Acute embolism and thrombosis of right popliteal vein: Secondary | ICD-10-CM

## 2022-05-28 MED ORDER — APIXABAN 2.5 MG PO TABS: 2.5000 mg | ORAL_TABLET | Freq: Two times a day (BID) | ORAL | 1 refills | Status: DC

## 2022-05-28 MED ORDER — NITROGLYCERIN 0.4 MG SL SUBL: 0.4000 mg | SUBLINGUAL_TABLET | SUBLINGUAL | 1 refills | Status: DC | PRN

## 2022-05-28 NOTE — Telephone Encounter (Signed)
Requested medication (s) are due for refill today: review  Requested medication (s) are on the active medication list: yes  Last refill:  today  Future visit scheduled: no  Notes to clinic:  Pharmacy comment: Script Clarification:CLARIFY 1 BID OR HALF BID.      Requested Prescriptions  Pending Prescriptions Disp Refills   ELIQUIS 2.5 MG TABS tablet [Pharmacy Med Name: ELIQUIS 2.5 MG TABLET] 180 tablet 1    Sig: TAKE 1 TABLET (2.5 MG TOTAL) BY MOUTH 2 (TWO) TIMES DAILY. TAKE 1/2 TABLET TWICE EVERY DAY     Hematology:  Anticoagulants - apixaban Failed - 05/28/2022  5:30 PM      Failed - PLT in normal range and within 360 days    Platelets  Date Value Ref Range Status  03/17/2021 237 150 - 450 x10E3/uL Final         Failed - HGB in normal range and within 360 days    Hemoglobin  Date Value Ref Range Status  03/17/2021 14.6 13.0 - 17.7 g/dL Final         Failed - HCT in normal range and within 360 days    Hematocrit  Date Value Ref Range Status  03/17/2021 43.4 37.5 - 51.0 % Final         Failed - Cr in normal range and within 360 days    Creatinine, Ser  Date Value Ref Range Status  03/17/2021 0.84 0.76 - 1.27 mg/dL Final         Failed - AST in normal range and within 360 days    AST  Date Value Ref Range Status  03/17/2021 19 0 - 40 IU/L Final         Failed - ALT in normal range and within 360 days    ALT  Date Value Ref Range Status  03/17/2021 21 0 - 44 IU/L Final         Passed - Valid encounter within last 12 months    Recent Outpatient Visits           9 months ago Acute pain of right knee   [facility] Birdie Sons, MD   1 year ago Type 2 diabetes mellitus with diabetic polyneuropathy, without long-term current use of insulin ([place])   Keith, Donald E, MD   1 year ago Type 2 diabetes mellitus with diabetic polyneuropathy, without long-term current use of insulin ([place])   [place] Birdie Sons, MD   1 year ago Diabetes mellitus without complication [facility])   [place] Birdie Sons, MD   2 years ago Pulsatile tinnitus   [place], Fabio Bering [place], [place]

## 2022-05-28 NOTE — Addendum Note (Signed)
Addended by: Birdie Sons on: 05/28/2022 05:29 PM   Modules accepted: Orders

## 2022-06-17 ENCOUNTER — Other Ambulatory Visit: Payer: Self-pay | Admitting: Family Medicine

## 2022-06-17 DIAGNOSIS — E1142 Type 2 diabetes mellitus with diabetic polyneuropathy: Secondary | ICD-10-CM

## 2022-06-17 NOTE — Telephone Encounter (Signed)
Requested medication (s) are due for refill today: Yes  Requested medication (s) are on the active medication list: Yes  Last refill:  03/17/21  Future visit scheduled: No  Notes to clinic:  Prescription has expired.    Requested Prescriptions  Pending Prescriptions Disp Refills   metFORMIN (GLUCOPHAGE-XR) 500 MG 24 hr tablet [Pharmacy Med Name: METFORMIN HCL ER 500 MG TABLET] 360 tablet 4    Sig: TAKE 2 TABLETS BY MOUTH TWICE A DAY     Endocrinology:  Diabetes - Biguanides Failed - 06/17/2022  2:19 AM      Failed - Cr in normal range and within 360 days    Creatinine, Ser  Date Value Ref Range Status  03/17/2021 0.84 0.76 - 1.27 mg/dL Final         Failed - HBA1C is between 0 and 7.9 and within 180 days    Hemoglobin A1C  Date Value Ref Range Status  03/17/2021 6.2 (A) 4.0 - 5.6 % Final    Comment:    Average 131   Hgb A1c MFr Bld  Date Value Ref Range Status  10/18/2018 6.9 (H) 4.8 - 5.6 % Final    Comment:             Prediabetes: 5.7 - 6.4          Diabetes: >6.4          Glycemic control for adults with diabetes: <7.0          Failed - eGFR in normal range and within 360 days    GFR calc Af Amer  Date Value Ref Range Status  02/19/2020 95 >59 mL/min/1.73 Final    Comment:    **In accordance with recommendations from the NKF-ASN Task force,**   [facility] is in the process of updating its eGFR calculation to the   2021 CKD-EPI creatinine equation that estimates kidney function   without a race variable.    GFR, Estimated  Date Value Ref Range Status  02/19/2020 >60 >60 mL/min Final    Comment:    (NOTE) Calculated using the CKD-EPI Creatinine Equation (2021)    GFR calc non Af Amer  Date Value Ref Range Status  02/19/2020 82 >59 mL/min/1.73 Final   eGFR  Date Value Ref Range Status  03/17/2021 96 >59 mL/min/1.73 Final         Failed - B12 Level in normal range and within 720 days    No results found for: "VITAMINB12"       Failed - CBC within  normal limits and completed in the last 12 months    WBC  Date Value Ref Range Status  03/17/2021 8.4 3.4 - 10.8 x10E3/uL Final  02/19/2020 7.9 4.0 - 10.5 K/uL Final   RBC  Date Value Ref Range Status  03/17/2021 4.70 4.14 - 5.80 x10E6/uL Final  02/19/2020 4.49 4.22 - 5.81 MIL/uL Final   Hemoglobin  Date Value Ref Range Status  03/17/2021 14.6 13.0 - 17.7 g/dL Final   Hematocrit  Date Value Ref Range Status  03/17/2021 43.4 37.5 - 51.0 % Final   MCHC  Date Value Ref Range Status  03/17/2021 33.6 31.5 - 35.7 g/dL Final  02/19/2020 32.9 30.0 - 36.0 g/dL Final   [facility]  Date Value Ref Range Status  03/17/2021 31.1 26.6 - 33.0 pg Final  02/19/2020 30.7 26.0 - 34.0 pg Final   MCV  Date Value Ref Range Status  03/17/2021 92 79 - 97 fL Final   No  results found for: "PLTCOUNTKUC", "LABPLAT", "POCPLA" RDW  Date Value Ref Range Status  03/17/2021 11.9 11.6 - 15.4 % Final         Passed - Valid encounter within last 6 months    Recent Outpatient Visits           10 months ago Acute pain of right knee   [facility] Birdie Sons, MD   1 year ago Type 2 diabetes mellitus with diabetic polyneuropathy, without long-term current use of insulin ([place])   [place] Birdie Sons, MD   1 year ago Type 2 diabetes mellitus with diabetic polyneuropathy, without long-term current use of insulin ([place])   [place] Birdie Sons, MD   1 year ago Diabetes mellitus without complication [facility])   [place] Birdie Sons, MD   2 years ago Pulsatile tinnitus   [place], Fabio Bering [place], [place]

## 2022-06-21 DIAGNOSIS — G4733 Obstructive sleep apnea (adult) (pediatric): Secondary | ICD-10-CM | POA: Diagnosis not present

## 2022-07-02 ENCOUNTER — Other Ambulatory Visit: Payer: Self-pay | Admitting: Family Medicine

## 2022-07-02 DIAGNOSIS — E1142 Type 2 diabetes mellitus with diabetic polyneuropathy: Secondary | ICD-10-CM

## 2022-07-30 ENCOUNTER — Ambulatory Visit
Admission: RE | Admit: 2022-07-30 | Discharge: 2022-07-30 | Disposition: A | Discharge status: Home / Self Care | Payer: Medicare Other | Source: Ambulatory Visit | Attending: Family Medicine | Admitting: Family Medicine

## 2022-07-30 DIAGNOSIS — Z122 Encounter for screening for malignant neoplasm of respiratory organs: Secondary | ICD-10-CM | POA: Diagnosis not present

## 2022-07-30 DIAGNOSIS — Z87891 Personal history of nicotine dependence: Secondary | ICD-10-CM | POA: Diagnosis not present

## 2022-07-30 DIAGNOSIS — F17210 Nicotine dependence, cigarettes, uncomplicated: Secondary | ICD-10-CM | POA: Insufficient documentation

## 2022-08-04 ENCOUNTER — Other Ambulatory Visit: Payer: Self-pay | Admitting: Acute Care

## 2022-08-04 ENCOUNTER — Encounter: Payer: Self-pay | Admitting: Family Medicine

## 2022-08-04 DIAGNOSIS — Z122 Encounter for screening for malignant neoplasm of respiratory organs: Secondary | ICD-10-CM

## 2022-08-04 DIAGNOSIS — I700 Atherosclerosis of aorta: Secondary | ICD-10-CM | POA: Insufficient documentation

## 2022-08-04 DIAGNOSIS — Z87891 Personal history of nicotine dependence: Secondary | ICD-10-CM

## 2022-08-04 DIAGNOSIS — F17210 Nicotine dependence, cigarettes, uncomplicated: Secondary | ICD-10-CM

## 2022-08-19 DIAGNOSIS — H40003 Preglaucoma, unspecified, bilateral: Secondary | ICD-10-CM | POA: Diagnosis not present

## 2022-08-25 DIAGNOSIS — E119 Type 2 diabetes mellitus without complications: Secondary | ICD-10-CM | POA: Diagnosis not present

## 2022-08-25 DIAGNOSIS — Z961 Presence of intraocular lens: Secondary | ICD-10-CM | POA: Diagnosis not present

## 2022-08-25 DIAGNOSIS — H40003 Preglaucoma, unspecified, bilateral: Secondary | ICD-10-CM | POA: Diagnosis not present

## 2022-08-25 LAB — HM DIABETES EYE EXAM

## 2022-08-26 ENCOUNTER — Encounter: Payer: Self-pay | Admitting: Family Medicine

## 2022-09-07 ENCOUNTER — Other Ambulatory Visit: Payer: Self-pay | Admitting: Family Medicine

## 2022-09-07 DIAGNOSIS — E1142 Type 2 diabetes mellitus with diabetic polyneuropathy: Secondary | ICD-10-CM

## 2022-09-08 NOTE — Telephone Encounter (Signed)
Requested Prescriptions  Pending Prescriptions Disp Refills   metFORMIN (GLUCOPHAGE-XR) 500 MG 24 hr tablet [Pharmacy Med Name: METFORMIN HCL ER 500 MG TABLET] 360 tablet 0    Sig: TAKE 2 TABLETS BY MOUTH TWICE A DAY     Endocrinology:  Diabetes - Biguanides Failed - 09/07/2022  2:30 PM      Failed - Cr in normal range and within 360 days    Creatinine, Ser  Date Value Ref Range Status  03/17/2021 0.84 0.76 - 1.27 mg/dL Final         Failed - HBA1C is between 0 and 7.9 and within 180 days    Hemoglobin A1C  Date Value Ref Range Status  03/17/2021 6.2 (A) 4.0 - 5.6 % Final    Comment:    Average 131   Hgb A1c MFr Bld  Date Value Ref Range Status  10/18/2018 6.9 (H) 4.8 - 5.6 % Final    Comment:             Prediabetes: 5.7 - 6.4          Diabetes: >6.4          Glycemic control for adults with diabetes: <7.0          Failed - eGFR in normal range and within 360 days    GFR calc Af Amer  Date Value Ref Range Status  02/19/2020 95 >59 mL/min/1.73 Final    Comment:    **In accordance with recommendations from the NKF-ASN Task force,**   [facility] is in the process of updating its eGFR calculation to the   2021 CKD-EPI creatinine equation that estimates kidney function   without a race variable.    GFR, Estimated  Date Value Ref Range Status  02/19/2020 >60 >60 mL/min Final    Comment:    (NOTE) Calculated using the CKD-EPI Creatinine Equation (2021)    GFR calc non Af Amer  Date Value Ref Range Status  02/19/2020 82 >59 mL/min/1.73 Final   eGFR  Date Value Ref Range Status  03/17/2021 96 >59 mL/min/1.73 Final         Failed - B12 Level in normal range and within 720 days    No results found for: "VITAMINB12"       Failed - CBC within normal limits and completed in the last 12 months    WBC  Date Value Ref Range Status  03/17/2021 8.4 3.4 - 10.8 x10E3/uL Final  02/19/2020 7.9 4.0 - 10.5 K/uL Final   RBC  Date Value Ref Range Status  03/17/2021 4.70 4.14 -  5.80 x10E6/uL Final  02/19/2020 4.49 4.22 - 5.81 MIL/uL Final   Hemoglobin  Date Value Ref Range Status  03/17/2021 14.6 13.0 - 17.7 g/dL Final   Hematocrit  Date Value Ref Range Status  03/17/2021 43.4 37.5 - 51.0 % Final   MCHC  Date Value Ref Range Status  03/17/2021 33.6 31.5 - 35.7 g/dL Final  78/29/5621 30.8 30.0 - 36.0 g/dL Final   [facility]  Date Value Ref Range Status  03/17/2021 31.1 26.6 - 33.0 pg Final  02/19/2020 30.7 26.0 - 34.0 pg Final   MCV  Date Value Ref Range Status  03/17/2021 92 79 - 97 fL Final   No results found for: "PLTCOUNTKUC", "LABPLAT", "POCPLA" RDW  Date Value Ref Range Status  03/17/2021 11.9 11.6 - 15.4 % Final         Passed - Valid encounter within last 6 months    Recent  Outpatient Visits           1 year ago Acute pain of right knee   [facility] Malva Limes, MD   1 year ago Type 2 diabetes mellitus with diabetic polyneuropathy, without long-term current use of insulin (HCC)   [place] [facility] Malva Limes, MD   1 year ago Type 2 diabetes mellitus with diabetic polyneuropathy, without long-term current use of insulin (HCC)   [place] [facility] Malva Limes, MD   2 years ago Diabetes mellitus without complication [facility])   [place] [facility] Malva Limes, MD   2 years ago Pulsatile tinnitus   [place] [facility] Trey Sailors, [place]       Future Appointments             In 3 weeks Sherrie Mustache, Demetrios Isaacs, MD [facility], [facility]

## 2022-09-29 ENCOUNTER — Ambulatory Visit (INDEPENDENT_AMBULATORY_CARE_PROVIDER_SITE_OTHER): Payer: Medicare Other | Admitting: Family Medicine

## 2022-09-29 ENCOUNTER — Encounter: Payer: Self-pay | Admitting: Family Medicine

## 2022-09-29 VITALS — BP 168/66 | HR 77 | Temp 97.7°F | Resp 16 | Ht 72.0 in | Wt 332.7 lb

## 2022-09-29 DIAGNOSIS — G4733 Obstructive sleep apnea (adult) (pediatric): Secondary | ICD-10-CM

## 2022-09-29 DIAGNOSIS — R200 Anesthesia of skin: Secondary | ICD-10-CM | POA: Diagnosis not present

## 2022-09-29 DIAGNOSIS — I700 Atherosclerosis of aorta: Secondary | ICD-10-CM

## 2022-09-29 DIAGNOSIS — E1169 Type 2 diabetes mellitus with other specified complication: Secondary | ICD-10-CM

## 2022-09-29 DIAGNOSIS — I252 Old myocardial infarction: Secondary | ICD-10-CM

## 2022-09-29 DIAGNOSIS — E1165 Type 2 diabetes mellitus with hyperglycemia: Secondary | ICD-10-CM | POA: Diagnosis not present

## 2022-09-29 DIAGNOSIS — I25118 Atherosclerotic heart disease of native coronary artery with other forms of angina pectoris: Secondary | ICD-10-CM

## 2022-09-29 DIAGNOSIS — K760 Fatty (change of) liver, not elsewhere classified: Secondary | ICD-10-CM

## 2022-09-29 DIAGNOSIS — R0600 Dyspnea, unspecified: Secondary | ICD-10-CM | POA: Diagnosis not present

## 2022-09-29 DIAGNOSIS — E785 Hyperlipidemia, unspecified: Secondary | ICD-10-CM

## 2022-09-29 DIAGNOSIS — Z0000 Encounter for general adult medical examination without abnormal findings: Secondary | ICD-10-CM

## 2022-09-29 MED ORDER — OZEMPIC (0.25 OR 0.5 MG/DOSE) 2 MG/3ML ~~LOC~~ SOPN: PEN_INJECTOR | SUBCUTANEOUS | Status: DC

## 2022-09-29 NOTE — Progress Notes (Signed)
Complete physical exam   Patient: Ronald Malone   DOB: August 18, 1955   67 y.o. Male  MRN: [id number] Visit Date: 09/29/2022  Today's healthcare provider: Mila Merry, MD   Chief Complaint  Patient presents with   Annual Exam   Diabetes   Hyperlipidemia   Hypertension   Subjective    Ronald Malone is a 67 y.o. male who presents today for a complete physical exam.   He has diabetes on metformin, does not check sugars. He has history of MI/stent placement in 2015. Has follow up with cardiology next week. Is taking medications consistently. Reports he is unable to walk for more than a few minutes before having to stop and rest due to pain in legs and hips, and shortness of breath. He has started back smoking. Followed by Dr. Meredeth Ide for COPD on Combivent prn only. He has a pain on the side of his chest that he lays on when he wakes up in the morning that resolves after he gets up and around. He has been referred to vascular in the past for suspected claudications but has had repeatedly normal ABIs. He does have sleep apnea and reports that he uses all night every night.   Past Medical History:  Diagnosis Date   Cataract 9/22   CHF (congestive heart failure) (HCC)    Clotting disorder (HCC)    MI (myocardial infarction) (HCC) 2015   Peripheral neuropathy    Pulmonary embolism (HCC) 2020   Sleep apnea    CPAP   Torn meniscus    right   Wears dentures    full upper, partial lower   Wears hearing aid in right ear    Past Surgical History:  Procedure Laterality Date   CATARACT EXTRACTION W/PHACO Right 01/13/2022   Procedure: CATARACT EXTRACTION PHACO AND INTRAOCULAR LENS PLACEMENT (IOC) RIGHT 9.56 01:34.8;  Surgeon: Lockie Mola, MD;  Location: [facility];  Service: Ophthalmology;  Laterality: Right;  Diabetic   CATARACT EXTRACTION W/PHACO Left 01/27/2022   Procedure: CATARACT EXTRACTION PHACO AND INTRAOCULAR LENS PLACEMENT (IOC) LEFT 3.42  00:52.2;   Surgeon: Lockie Mola, MD;  Location: [facility];  Service: Ophthalmology;  Laterality: Left;  Diabetic   COLON SURGERY     1/3 colon removed as an infant   COLONOSCOPY WITH PROPOFOL N/A 12/26/2018   Procedure: COLONOSCOPY WITH PROPOFOL;  Surgeon: Midge Minium, MD;  Location: [facility];  Service: Endoscopy;  Laterality: N/A;   COLONOSCOPY WITH PROPOFOL N/A 04/01/2020   Procedure: COLONOSCOPY WITH PROPOFOL;  Surgeon: Midge Minium, MD;  Location: [facility] END[facility]COPY;  Service: Endoscopy;  Laterality: N/A;   CORONARY ANGIOPLASTY WITH STENT PLACEMENT  2015   DES STENT POST MI, [facility]   LOWER EXTREMITY ANGIOGRAPHY Right 10/23/2018   Procedure: LOWER EXTREMITY ANGIOGRAPHY;  Surgeon: Annice Needy, MD;  Location: [facility] INVASIVE CV LAB;  Service: Cardiovascular;  Laterality: Right;   SMALL INTESTINE SURGERY  1955   Social History   Socioeconomic History   Marital status: Married    Spouse name: Ronald Malone    Number of children: 2   Years of education: Not on file   Highest education level: Bachelor's degree (e.g., BA, AB, BS)  Occupational History   Occupation: RETIRED   Tobacco Use   Smoking status: Every Day    Current packs/day: 2.00    Average packs/day: 2.0 packs/day for 46.5 years (93.1 ttl pk-yrs)    Types: Cigarettes    Start date: 71  Smokeless tobacco: Never  Vaping Use   Vaping status: Never Used  Substance and Sexual Activity   Alcohol use: Yes    Alcohol/week: 2.0 standard drinks of alcohol    Types: 2 Cans of beer per week    Comment: rare   Drug use: Not Currently   Sexual activity: Not Currently    Birth control/protection: None  Other Topics Concern   Not on file  Social History Narrative   Not on file   Social Determinants of Health   Financial Resource Strain: Low Risk  (04/20/2022)   Overall Financial Resource Strain (CARDIA)    Difficulty of Paying Living Expenses: Not hard at all  Food Insecurity: No Food Insecurity (04/20/2022)    Hunger Vital Sign    Worried About Running Out of Food in the Last Year: Never true    Ran Out of Food in the Last Year: Never true  Transportation Needs: No Transportation Needs (04/20/2022)   PRAPARE - Administrator, Civil Service (Medical): No    Lack of Transportation (Non-Medical): No  Physical Activity: Inactive (04/20/2022)   Exercise Vital Sign    Days of Exercise per Week: 0 days    Minutes of Exercise per Session: 0 min  Stress: No Stress Concern Present (04/20/2022)   [employer] of Occupational Health - Occupational Stress Questionnaire    Feeling of Stress : Not at all  Social Connections: Moderately Isolated (04/20/2022)   Social Connection and Isolation Panel [NHANES]    Frequency of Communication with Friends and Family: Once a week    Frequency of Social Gatherings with Friends and Family: Once a week    Attends Religious Services: Never    Database administrator or Organizations: Yes    Attends Banker Meetings: 1 to 4 times per year    Marital Status: Married  Catering manager Violence: Not At Risk (04/20/2022)   Humiliation, Afraid, Rape, and Kick questionnaire    Fear of Current or Ex-Partner: No    Emotionally Abused: No    Physically Abused: No    Sexually Abused: No   Family Status  Relation Name Status   Mother  Deceased   Father  Deceased   Sister  Alive   Sister  Alive   Sister  Alive  No partnership data on file   Family History  Problem Relation Age of Onset   Parkinson's disease Mother    Dementia Father    No Known Allergies  Patient Care Team: Malva Limes, MD as PCP - General (Family Medicine) Midge Minium, MD as Consulting Physician (Gastroenterology) Mertie Moores, MD as Referring Physician (Specialist) Alwyn Pea, MD as Consulting Physician (Cardiology) Dingeldein, Viviann Spare, MD (Ophthalmology) Wyn Quaker Marlow Baars, MD as Referring Physician (Vascular Surgery) Pa, [facility] (Optometry)    Medications: Outpatient Medications Prior to Visit  Medication Sig   apixaban (ELIQUIS) 2.5 MG TABS tablet Take 1 tablet (2.5 mg total) by mouth 2 (two) times daily.   atorvastatin (LIPITOR) 20 MG tablet Take 20 mg by mouth daily.   COMBIVENT RESPIMAT 20-100 MCG/ACT AERS respimat Inhale 1 puff into the lungs every 6 (six) hours as needed.   metFORMIN (GLUCOPHAGE-XR) 500 MG 24 hr tablet TAKE 2 TABLETS BY MOUTH TWICE A DAY   nitroGLYCERIN (NITROSTAT) 0.4 MG SL tablet Place 1 tablet (0.4 mg total) under the tongue every 5 (five) minutes as needed for chest pain.   ACCU-CHEK GUIDE test strip USE ONCE  DAILY IN MORNING (E11.9)   blood glucose meter kit and supplies Dispense based on patient and insurance preference. Use once daily in the morning. (FOR ICD-10 E11.9).   cyanocobalamin 1000 MCG tablet Take 1,000 mcg by mouth daily.   latanoprost (XALATAN) 0.005 % ophthalmic solution Place 1 drop into both eyes at bedtime. (Patient not taking: Reported on 04/20/2022)   No facility-administered medications prior to visit.     Objective    BP (!) 168/66 (BP Location: Left Arm, Patient Position: Sitting, Cuff Size: Large)   Pulse 77   Temp 97.7 F (36.5 C) (Temporal)   Resp 16   Ht 6' (1.829 m)   Wt (!) 332 lb 11.2 oz (150.9 kg)   SpO2 96%   BMI 45.12 kg/m    Physical Exam   General Appearance:    Severely obese male. Alert, cooperative, in no acute distress, appears stated age  Head:    Normocephalic, without obvious abnormality, atraumatic  Eyes:    PERRL, conjunctiva/corneas clear, EOM's intact, fundi    benign, both eyes       Ears:    Normal TM's and external ear canals, both ears  Nose:   Nares normal, septum midline, mucosa normal, no drainage   or sinus tenderness  Throat:   Lips, mucosa, and tongue normal; teeth and gums normal  Neck:   Supple, symmetrical, trachea midline, no adenopathy;       thyroid:  No enlargement/tenderness/nodules; no carotid   bruit or JVD  Back:      Symmetric, no curvature, ROM normal, no CVA tenderness  Lungs:     Clear to auscultation bilaterally, respirations unlabored  Chest wall:    No tenderness or deformity  Heart:    Normal heart rate. Normal rhythm. No murmurs, rubs, or gallops.  S1 and S2 normal  Abdomen:     Soft, non-tender, bowel sounds active all four quadrants,    no masses, no organomegaly  Genitalia:    deferred  Rectal:    deferred  Extremities:   All extremities are intact. No cyanosis or edema  Pulses:   2+ and symmetric all extremities  Skin:   Skin color, texture, turgor normal, no rashes or lesions  Lymph nodes:   Cervical, supraclavicular, and axillary nodes normal  Neurologic:   CNII-XII intact. Normal strength, sensation and reflexes      throughout     Last depression screening scores    09/29/2022    9:39 AM 04/20/2022   10:51 AM 04/16/2021    9:46 AM  PHQ 2/9 Scores  PHQ - 2 Score 0 0 0   Last fall risk screening    09/29/2022    9:39 AM  Fall Risk   Falls in the past year? 0  Number falls in past yr: 0  Injury with Fall? 0  Risk for fall due to : No Fall Risks  Follow up Falls evaluation completed   Last Audit-C alcohol use screening    09/29/2022    9:39 AM  Alcohol Use Disorder Test (AUDIT)  1. How often do you have a drink containing alcohol? 2  2. How many drinks containing alcohol do you have on a typical day when you are drinking? 0  3. How often do you have six or more drinks on one occasion? 1  AUDIT-C Score 3   A score of 3 or more in women, and 4 or more in men indicates increased risk for alcohol abuse, EXCEPT if  all of the points are from question 1   No results found for any visits on 09/29/22.  Assessment & Plan    Routine Health Maintenance and Physical Exam  Exercise Activities and Dietary recommendations  Goals       DIET - EAT MORE FRUITS AND VEGETABLES      Exercise for Diabetes (pt-stated)      Current Barriers:  Knowledge Deficits related to basic Diabetes  pathophysiology and self care/management   Case Manager Clinical Goal(s):  Over the next 60 days, patient will demonstrate improved adherence to prescribed treatment plan for diabetes self care/management as evidenced by:  daily monitoring and recording of CBG  adherence to ADA/ carb modified diet exercise 3 days/week adherence to prescribed medication regimen  Interventions:  Provided education to patient about basic DM disease process Provided patient with written educational materials related to hypo and hyperglycemia and importance of correct treatment Counseling on ADA diet Silver Sneakers/gym benefits of [facility] AARP plan  Patient Self Care Activities:  Checks blood sugars as prescribed and utilize hyper and hypoglycemia protocol as needed Adheres to prescribed ADA/carb modified diet  Initial goal documentation       Patient Stated      No new goals      Quit Smoking      Recommend to continue efforts to reduce smoking habits until no longer smoking.         Immunization History  Administered Date(s) Administered   Covid-19, Mrna,Vaccine(Spikevax)69yrs and older 02/03/2022   Fluad Quad(high Dose 65+) 01/02/2019, 01/23/2020, 12/30/2021   Influenza, High Dose Seasonal PF 12/16/2020   Moderna Covid-19 Vaccine Bivalent Booster 24yrs & up 12/16/2020, 10/07/2021   Moderna Sars-Covid-2 Vaccination 03/28/2019, 04/25/2019, 01/07/2020   Pneumococcal Conjugate-13 10/18/2018   Pneumococcal Polysaccharide-23 03/24/2020   Tdap 09/18/2019   Zoster Recombinant(Shingrix) 02/11/2020, 04/24/2020    Health Maintenance  Topic Date Due   HEMOGLOBIN A1C  09/14/2021   Diabetic kidney evaluation - eGFR measurement  03/17/2022   Diabetic kidney evaluation - Urine ACR  03/17/2022   COVID-19 Vaccine (7 - 2023-24 season) 12/14/2022 (Originally 03/31/2022)   INFLUENZA VACCINE  10/14/2022   Medicare Annual Wellness (AWV)  04/21/2023   Lung Cancer Screening  07/30/2023   OPHTHALMOLOGY EXAM   08/25/2023   Colonoscopy  04/01/2025   DTaP/Tdap/Td (2 - Td or Tdap) 09/17/2029   Pneumonia Vaccine 14+ Years old  Completed   Hepatitis C Screening  Completed   Zoster Vaccines- Shingrix  Completed   HPV VACCINES  Aged Out    Discussed health benefits of physical activity, and encouraged him to engage in regular exercise appropriate for his age and condition.  2. Dyspnea, unspecified type Multifactorial, known COPD, likely significant deconditioning, likely some cardiac dysfunction - Brain natriuretic peptide  3. Fatty liver   4. Type 2 diabetes mellitus with hyperglycemia, without long-term current use of insulin (HCC)  - Hemoglobin A1c - TSH - CBC with Differential/Platelet He is significantly elevated risk of cardiac disease progression.   Start samples Semaglutide,0.25 or 0.5MG /DOS, (OZEMPIC, 0.25 OR 0.5 MG/DOSE,) 2 MG/3ML SOPN; Inject 0.25mg  weekly x 4 then increase to 0.5mg  weekly  Will contact him in about a month and send in prescription if tolerating.   5. Morbid obesity (HCC) GLP1 agonist clearly indicated  6. Coronary artery disease of native artery of native heart with stable angina pectoris (HCC) On statin and low dose Eliquis.  - EKG 12-Lead  Follow up cardiology next week /asch   7.  History of MI (myocardial infarction)   8. Aortic atherosclerosis (HCC)   9. Hyperlipidemia associated with type 2 diabetes mellitus (HCC) He is tolerating atorvastatin well with no adverse effects.   - Comprehensive metabolic panel - Lipid panel  10. Bilateral leg numbness Has been off of b12 supplements for several months.  - Magnesium - Vitamin B12  11. OSA on CPAP Using CPAP consistently every night and medically benefiting from its use.       Mila Merry, MD  [facility] [phone] (phone) [phone] (fax)  [facility]

## 2022-09-29 NOTE — Patient Instructions (Addendum)
Please review the attached list of medications and notify my office if there are any errors.   Try over the counter Flonase (fluticasone) or Nasacort every night to help with nasal drainage  Take 0.25mg  Ozempic once a week x 4, then increase to 0.5mg  once a week. We'll contact you before the samples run out to make sure you are tolerating the medications before

## 2022-09-30 LAB — COMPREHENSIVE METABOLIC PANEL
ALT: 30 IU/L (ref 0–44)
AST: 19 IU/L (ref 0–40)
Albumin: 4.2 g/dL (ref 3.9–4.9)
Alkaline Phosphatase: 78 IU/L (ref 44–121)
BUN/Creatinine Ratio: 17 (ref 10–24)
BUN: 15 mg/dL (ref 8–27)
Bilirubin Total: 0.3 mg/dL (ref 0.0–1.2)
CO2: 21 mmol/L (ref 20–29)
Calcium: 9.2 mg/dL (ref 8.6–10.2)
Chloride: 101 mmol/L (ref 96–106)
Creatinine, Ser: 0.87 mg/dL (ref 0.76–1.27)
Globulin, Total: 2.5 g/dL (ref 1.5–4.5)
Glucose: 161 mg/dL — ABNORMAL HIGH (ref 70–99)
Potassium: 4.6 mmol/L (ref 3.5–5.2)
Sodium: 139 mmol/L (ref 134–144)
Total Protein: 6.7 g/dL (ref 6.0–8.5)
eGFR: 93 mL/min/{1.73_m2} (ref >59)

## 2022-09-30 LAB — CBC WITH DIFFERENTIAL/PLATELET
Basophils Absolute: 0 10*3/uL (ref 0.0–0.2)
Basos: 0 %
EOS (ABSOLUTE): 0.3 10*3/uL (ref 0.0–0.4)
Eos: 3 %
Hematocrit: 41.2 % (ref 37.5–51.0)
Hemoglobin: 14 g/dL (ref 13.0–17.7)
Immature Grans (Abs): 0 10*3/uL (ref 0.0–0.1)
Immature Granulocytes: 1 %
Lymphocytes Absolute: 1.7 10*3/uL (ref 0.7–3.1)
Lymphs: 22 %
MCH: 31.2 pg (ref 26.6–33.0)
MCHC: 34 g/dL (ref 31.5–35.7)
MCV: 92 fL (ref 79–97)
Monocytes Absolute: 0.7 10*3/uL (ref 0.1–0.9)
Monocytes: 9 %
Neutrophils Absolute: 5 10*3/uL (ref 1.4–7.0)
Neutrophils: 65 %
Platelets: 235 10*3/uL (ref 150–450)
RBC: 4.49 x10E6/uL (ref 4.14–5.80)
RDW: 12.7 % (ref 11.6–15.4)
WBC: 7.6 10*3/uL (ref 3.4–10.8)

## 2022-09-30 LAB — LIPID PANEL
Chol/HDL Ratio: 3.4 ratio (ref 0.0–5.0)
Cholesterol, Total: 120 mg/dL (ref 100–199)
HDL: 35 mg/dL — ABNORMAL LOW (ref >39)
LDL Chol Calc (NIH): 61 mg/dL (ref 0–99)
Triglycerides: 135 mg/dL (ref 0–149)
VLDL Cholesterol Cal: 24 mg/dL (ref 5–40)

## 2022-09-30 LAB — BRAIN NATRIURETIC PEPTIDE: BNP: 50.8 pg/mL (ref 0.0–100.0)

## 2022-09-30 LAB — MAGNESIUM: Magnesium: 1.9 mg/dL (ref 1.6–2.3)

## 2022-09-30 LAB — HEMOGLOBIN A1C
Est. average glucose Bld gHb Est-mCnc: 206 mg/dL
Hgb A1c MFr Bld: 8.8 % — ABNORMAL HIGH (ref 4.8–5.6)

## 2022-09-30 LAB — TSH: TSH: 1 u[IU]/mL (ref 0.450–4.500)

## 2022-09-30 LAB — VITAMIN B12: Vitamin B-12: 423 pg/mL (ref 232–1245)

## 2022-10-01 DIAGNOSIS — Z86711 Personal history of pulmonary embolism: Secondary | ICD-10-CM | POA: Diagnosis not present

## 2022-10-01 DIAGNOSIS — J439 Emphysema, unspecified: Secondary | ICD-10-CM | POA: Diagnosis not present

## 2022-10-01 DIAGNOSIS — Z87891 Personal history of nicotine dependence: Secondary | ICD-10-CM | POA: Diagnosis not present

## 2022-10-01 DIAGNOSIS — G4733 Obstructive sleep apnea (adult) (pediatric): Secondary | ICD-10-CM | POA: Diagnosis not present

## 2022-10-01 DIAGNOSIS — E119 Type 2 diabetes mellitus without complications: Secondary | ICD-10-CM | POA: Diagnosis not present

## 2022-10-01 DIAGNOSIS — Z86718 Personal history of other venous thrombosis and embolism: Secondary | ICD-10-CM | POA: Diagnosis not present

## 2022-10-01 DIAGNOSIS — I739 Peripheral vascular disease, unspecified: Secondary | ICD-10-CM | POA: Diagnosis not present

## 2022-10-01 DIAGNOSIS — I25118 Atherosclerotic heart disease of native coronary artery with other forms of angina pectoris: Secondary | ICD-10-CM | POA: Diagnosis not present

## 2022-10-01 DIAGNOSIS — R0609 Other forms of dyspnea: Secondary | ICD-10-CM | POA: Diagnosis not present

## 2022-10-01 DIAGNOSIS — R0602 Shortness of breath: Secondary | ICD-10-CM | POA: Diagnosis not present

## 2022-10-01 DIAGNOSIS — F17200 Nicotine dependence, unspecified, uncomplicated: Secondary | ICD-10-CM | POA: Diagnosis not present

## 2022-10-04 ENCOUNTER — Other Ambulatory Visit: Payer: Self-pay | Admitting: Internal Medicine

## 2022-10-04 DIAGNOSIS — I25708 Atherosclerosis of coronary artery bypass graft(s), unspecified, with other forms of angina pectoris: Secondary | ICD-10-CM

## 2022-10-05 ENCOUNTER — Other Ambulatory Visit: Payer: Self-pay | Admitting: Family Medicine

## 2022-10-05 LAB — SPECIMEN STATUS REPORT

## 2022-10-05 LAB — PSA: Prostate Specific Ag, Serum: 0.9 ng/mL (ref 0.0–4.0)

## 2022-10-11 ENCOUNTER — Encounter
Admission: RE | Admit: 2022-10-11 | Discharge: 2022-10-11 | Disposition: A | Discharge status: Home / Self Care | Payer: Medicare Other | Source: Ambulatory Visit | Attending: Internal Medicine | Admitting: Internal Medicine

## 2022-10-11 DIAGNOSIS — I25708 Atherosclerosis of coronary artery bypass graft(s), unspecified, with other forms of angina pectoris: Secondary | ICD-10-CM | POA: Insufficient documentation

## 2022-10-11 DIAGNOSIS — I25709 Atherosclerosis of coronary artery bypass graft(s), unspecified, with unspecified angina pectoris: Secondary | ICD-10-CM | POA: Diagnosis not present

## 2022-10-11 LAB — NM MYOCAR MULTI W/SPECT W/WALL MOTION / EF
Estimated workload: 1
Exercise duration (min): 1 min
MPHR: 151 {beats}/min
Peak HR: 101 {beats}/min
Percent HR: 66 %
Rest HR: 75 {beats}/min

## 2022-10-11 MED ORDER — TECHNETIUM TC 99M TETROFOSMIN IV KIT
27.7700 | PACK | Freq: Once | INTRAVENOUS | Status: AC | PRN
  Administered 2022-10-11: 27.77 via INTRAVENOUS

## 2022-10-12 ENCOUNTER — Encounter: Admission: RE | Admit: 2022-10-12 | Payer: Medicare Other | Source: Ambulatory Visit

## 2022-10-12 MED ORDER — REGADENOSON 0.4 MG/5ML IV SOLN
0.4000 mg | Freq: Once | INTRAVENOUS | Status: AC
  Administered 2022-10-11: 0.4 mg via INTRAVENOUS

## 2022-10-12 MED ORDER — TECHNETIUM TC 99M TETROFOSMIN IV KIT
30.0000 | PACK | Freq: Once | INTRAVENOUS | Status: AC | PRN
  Administered 2022-10-12: 31.37 via INTRAVENOUS

## 2022-10-14 ENCOUNTER — Other Ambulatory Visit: Payer: Medicare Other

## 2022-10-15 ENCOUNTER — Other Ambulatory Visit: Payer: Medicare Other

## 2022-10-15 ENCOUNTER — Encounter: Payer: Self-pay | Admitting: Family Medicine

## 2022-10-15 DIAGNOSIS — I739 Peripheral vascular disease, unspecified: Secondary | ICD-10-CM | POA: Diagnosis not present

## 2022-10-15 DIAGNOSIS — R0602 Shortness of breath: Secondary | ICD-10-CM | POA: Diagnosis not present

## 2022-10-15 DIAGNOSIS — E1165 Type 2 diabetes mellitus with hyperglycemia: Secondary | ICD-10-CM

## 2022-10-15 DIAGNOSIS — I25118 Atherosclerotic heart disease of native coronary artery with other forms of angina pectoris: Secondary | ICD-10-CM | POA: Diagnosis not present

## 2022-10-26 DIAGNOSIS — Z86711 Personal history of pulmonary embolism: Secondary | ICD-10-CM | POA: Diagnosis not present

## 2022-10-26 DIAGNOSIS — G4733 Obstructive sleep apnea (adult) (pediatric): Secondary | ICD-10-CM | POA: Diagnosis not present

## 2022-10-26 DIAGNOSIS — Z86718 Personal history of other venous thrombosis and embolism: Secondary | ICD-10-CM | POA: Diagnosis not present

## 2022-10-26 DIAGNOSIS — R0602 Shortness of breath: Secondary | ICD-10-CM | POA: Diagnosis not present

## 2022-10-26 DIAGNOSIS — I739 Peripheral vascular disease, unspecified: Secondary | ICD-10-CM | POA: Diagnosis not present

## 2022-10-26 DIAGNOSIS — Z87891 Personal history of nicotine dependence: Secondary | ICD-10-CM | POA: Diagnosis not present

## 2022-10-26 DIAGNOSIS — I25118 Atherosclerotic heart disease of native coronary artery with other forms of angina pectoris: Secondary | ICD-10-CM | POA: Diagnosis not present

## 2022-10-26 DIAGNOSIS — R0609 Other forms of dyspnea: Secondary | ICD-10-CM | POA: Diagnosis not present

## 2022-11-01 ENCOUNTER — Other Ambulatory Visit: Payer: Self-pay | Admitting: Internal Medicine

## 2022-11-01 DIAGNOSIS — I238 Other current complications following acute myocardial infarction: Secondary | ICD-10-CM

## 2022-11-01 DIAGNOSIS — R5383 Other fatigue: Secondary | ICD-10-CM

## 2022-11-01 DIAGNOSIS — I252 Old myocardial infarction: Secondary | ICD-10-CM

## 2022-11-03 ENCOUNTER — Encounter: Payer: Medicare Other | Attending: Family Medicine | Admitting: Dietician

## 2022-11-03 ENCOUNTER — Encounter: Payer: Self-pay | Admitting: Family Medicine

## 2022-11-03 ENCOUNTER — Encounter: Payer: Self-pay | Admitting: Dietician

## 2022-11-03 VITALS — Ht 72.0 in | Wt 330.8 lb

## 2022-11-03 DIAGNOSIS — E1165 Type 2 diabetes mellitus with hyperglycemia: Secondary | ICD-10-CM | POA: Diagnosis not present

## 2022-11-03 DIAGNOSIS — I25118 Atherosclerotic heart disease of native coronary artery with other forms of angina pectoris: Secondary | ICD-10-CM | POA: Insufficient documentation

## 2022-11-03 DIAGNOSIS — Z713 Dietary counseling and surveillance: Secondary | ICD-10-CM | POA: Diagnosis not present

## 2022-11-03 NOTE — Progress Notes (Signed)
Medical Nutrition Therapy: Visit start time: 1545  end time: 1655  Assessment:   Referral Diagnosis: Type 2 Diabetes, CAD Other medical history/ diagnoses: HTN, COPD, sleep apnea, obesity, hx MI with stent placement, peripheral neuropathy Psychosocial issues/ stress concerns: none  Medications, supplements: reconciled list in medical record   Current weight: 330.8lbs Height: 6'0" BMI: 44.86   Progress and evaluation:  Patient reports concern after MRI results showed low left ventricular EF at 25%. He reports MI about 9 years ago, but states his cholesterol levels have never been elevated. He is taking statin.  Recent HbA1C of 8.8%, up from 6.3% about a year ago per patient. Has been 2-pack-a-day smoker for >46 years Food allergies: none known Special diet practices: none Patient seeks help with heart healthy diet     Dietary Intake:  Usual eating pattern includes 2-3 meals and 1 snacks per day. Dining out frequency: 1-2 times a week  Breakfast: 8-10:30am -- toast, sometimes 4 eggs with cheese and sometimes bacon or sausage; pancakes; cereal cheerios or rice chex/ oatmeal (cooked and instant) Snack: none Lunch: sometimes skips if breakfast is late; 1-2 sandwiches with meat and cheese or similar usu about 1pm Snack: none Supper: spaghetti; chicken; beef and gravy with noodles; occ steak (12oz); pizza + veg ie salad; 2 cheeseburgers with mayo, lettuce, tomato, onion, sometimes chili Snack: chips; watermelon; candy bar; milk at bedtime Beverages: coffee throughout day with 2% milk no sugar; 1 glass iced tea; water; 1 glass 2% milk daily  Physical activity: no structured exercise; some yard work; wood work   Intervention:   Nutrition Care Education:   Basic nutrition: basic food groups; appropriate nutrient balance; appropriate meal and snack schedule; general nutrition guidelines    Weight control: importance of low sugar and low fat choices; portion control; estimated energy  needs for weight loss at 1600-1800 kcal, provided guidance for 47% CHO, 22% pro, 30% fat Diabetes: appropriate meal and snack schedule; appropriate carb intake and balance, healthy carb choices; role of protein; physical activity; effects of stress Heart health:  importance of controlling BP; identifying high sodium foods (processed meats), veg and fruit intake for potassium and magnesium; weight loss for BP control; healthy and unhealthy fats; role of fiber;role of exercise   Other intervention notes: Current diet seems to be high in saturated fats and caloric intake, as well as caffeine from multiple cups of coffee daily. Patient voices readiness to make some changes.  Established goals for change with input from patient.  Did not address low sodium diet in detail, or potential effects of high caffeine intake, due to time and multiple other needed changes.  Patient declined scheduling follow up at this time but will schedule later if needed.   Nutritional Diagnosis:  [place]-2.1 Inpaired nutrition utilization and [place]-2.2 Altered nutrition-related laboratory As related to hypertension, coronary artery disease, diabetes.  As evidenced by low LVEF, history of MI, BP controlled with medication, elevated HbA1C. [place]-3.3 Overweight/obesity As related to excess calories and inadequate physical activity.  As evidenced by patient with current BMI of 44.86.   Education Materials given:  [employer] guidelines for Heart health Designer, industrial/product with food lists, sample meal pattern Visit summary with goals/ instructions   Learner/ who was taught:  Patient   Level of understanding: Verbalizes/ demonstrates competency  Demonstrated degree of understanding via:   Teach back Learning barriers: None  Willingness to learn/ readiness for change: Acceptance, ready for some change  Monitoring and Evaluation:  Dietary intake, exercise,  and body weight      follow up: prn

## 2022-11-03 NOTE — Patient Instructions (Signed)
Decrease portions of meats, eggs, cheese. Eat chicken, [place], or fish more often.  Eat larger portions of vegetables. Include fruit at least 2 times a day.  Gradually increase daily movement with walking around the house. Start with a few minutes and slowly increase as tolerated.

## 2022-11-04 MED ORDER — OZEMPIC (0.25 OR 0.5 MG/DOSE) 2 MG/3ML ~~LOC~~ SOPN
0.5000 mg | PEN_INJECTOR | SUBCUTANEOUS | 2 refills | Status: DC
Start: 2022-11-04 — End: 2022-12-31

## 2022-11-19 ENCOUNTER — Encounter (INDEPENDENT_AMBULATORY_CARE_PROVIDER_SITE_OTHER): Payer: Medicare Other

## 2022-11-19 ENCOUNTER — Ambulatory Visit (INDEPENDENT_AMBULATORY_CARE_PROVIDER_SITE_OTHER): Payer: Medicare Other | Admitting: Vascular Surgery

## 2022-12-05 ENCOUNTER — Other Ambulatory Visit: Payer: Self-pay | Admitting: Family Medicine

## 2022-12-05 DIAGNOSIS — E1142 Type 2 diabetes mellitus with diabetic polyneuropathy: Secondary | ICD-10-CM

## 2022-12-06 ENCOUNTER — Encounter (HOSPITAL_COMMUNITY): Payer: Self-pay

## 2022-12-07 NOTE — Telephone Encounter (Signed)
Requested Prescriptions  Pending Prescriptions Disp Refills   metFORMIN (GLUCOPHAGE-XR) 500 MG 24 hr tablet [Pharmacy Med Name: METFORMIN HCL ER 500 MG TABLET] 360 tablet 0    Sig: TAKE 2 TABLETS BY MOUTH TWICE A DAY     Endocrinology:  Diabetes - Biguanides Failed - 12/05/2022  5:59 PM      Failed - HBA1C is between 0 and 7.9 and within 180 days    Hgb A1c MFr Bld  Date Value Ref Range Status  09/29/2022 8.8 (H) 4.8 - 5.6 % Final    Comment:             Prediabetes: 5.7 - 6.4          Diabetes: >6.4          Glycemic control for adults with diabetes: <7.0          Passed - Cr in normal range and within 360 days    Creatinine, Ser  Date Value Ref Range Status  09/29/2022 0.87 0.76 - 1.27 mg/dL Final         Passed - eGFR in normal range and within 360 days    GFR calc Af Amer  Date Value Ref Range Status  02/19/2020 95 >59 mL/min/1.73 Final    Comment:    **In accordance with recommendations from the NKF-ASN Task force,**   [facility] is in the process of updating its eGFR calculation to the   2021 CKD-EPI creatinine equation that estimates kidney function   without a race variable.    GFR, Estimated  Date Value Ref Range Status  02/19/2020 >60 >60 mL/min Final    Comment:    (NOTE) Calculated using the CKD-EPI Creatinine Equation (2021)    GFR calc non Af Amer  Date Value Ref Range Status  02/19/2020 82 >59 mL/min/1.73 Final   eGFR  Date Value Ref Range Status  09/29/2022 93 >59 mL/min/1.73 Final         Passed - B12 Level in normal range and within 720 days    Vitamin B-12  Date Value Ref Range Status  09/29/2022 423 232 - 1,245 pg/mL Final         Passed - Valid encounter within last 6 months    Recent Outpatient Visits           2 months ago Annual physical exam   [place] [facility] Malva Limes, MD   1 year ago Acute pain of right knee   [facility] Malva Limes, MD   1 year ago Type 2 diabetes  mellitus with diabetic polyneuropathy, without long-term current use of insulin (HCC)   [place] [facility] Malva Limes, MD   2 years ago Type 2 diabetes mellitus with diabetic polyneuropathy, without long-term current use of insulin (HCC)   [place] [facility] Malva Limes, MD   2 years ago Diabetes mellitus without complication [facility])    [facility] Malva Limes, MD              Passed - CBC within normal limits and completed in the last 12 months    WBC  Date Value Ref Range Status  09/29/2022 7.6 3.4 - 10.8 x10E3/uL Final  02/19/2020 7.9 4.0 - 10.5 K/uL Final   RBC  Date Value Ref Range Status  09/29/2022 4.49 4.14 - 5.80 x10E6/uL Final  02/19/2020 4.49 4.22 - 5.81 MIL/uL Final   Hemoglobin  Date Value Ref Range Status  09/29/2022 14.0 13.0 - 17.7 g/dL Final   Hematocrit  Date Value Ref Range Status  09/29/2022 41.2 37.5 - 51.0 % Final   MCHC  Date Value Ref Range Status  09/29/2022 34.0 31.5 - 35.7 g/dL Final  13/24/4010 27.2 30.0 - 36.0 g/dL Final   [facility]  Date Value Ref Range Status  09/29/2022 31.2 26.6 - 33.0 pg Final  02/19/2020 30.7 26.0 - 34.0 pg Final   MCV  Date Value Ref Range Status  09/29/2022 92 79 - 97 fL Final   No results found for: "PLTCOUNTKUC", "LABPLAT", "POCPLA" RDW  Date Value Ref Range Status  09/29/2022 12.7 11.6 - 15.4 % Final

## 2022-12-08 ENCOUNTER — Ambulatory Visit
Admission: RE | Admit: 2022-12-08 | Discharge: 2022-12-08 | Disposition: A | Discharge status: Home / Self Care | Payer: Medicare Other | Source: Ambulatory Visit | Attending: Internal Medicine | Admitting: Internal Medicine

## 2022-12-08 ENCOUNTER — Other Ambulatory Visit: Payer: Self-pay | Admitting: Internal Medicine

## 2022-12-08 DIAGNOSIS — I252 Old myocardial infarction: Secondary | ICD-10-CM

## 2022-12-08 DIAGNOSIS — I238 Other current complications following acute myocardial infarction: Secondary | ICD-10-CM

## 2022-12-08 DIAGNOSIS — R5383 Other fatigue: Secondary | ICD-10-CM

## 2022-12-08 MED ORDER — GADOBUTROL 1 MMOL/ML IV SOLN: 15.0000 mL | Freq: Once | INTRAVENOUS | Status: DC | PRN

## 2022-12-08 MED ORDER — GADOBUTROL 1 MMOL/ML IV SOLN
18.0000 mL | Freq: Once | INTRAVENOUS | Status: AC | PRN
  Administered 2022-12-08: 18 mL via INTRAVENOUS

## 2022-12-31 ENCOUNTER — Ambulatory Visit: Payer: Medicare Other | Admitting: Family Medicine

## 2022-12-31 ENCOUNTER — Encounter: Payer: Self-pay | Admitting: Family Medicine

## 2022-12-31 VITALS — BP 110/57 | HR 81 | Temp 97.9°F | Ht 72.0 in | Wt 323.1 lb

## 2022-12-31 DIAGNOSIS — E1165 Type 2 diabetes mellitus with hyperglycemia: Secondary | ICD-10-CM

## 2022-12-31 DIAGNOSIS — J449 Chronic obstructive pulmonary disease, unspecified: Secondary | ICD-10-CM

## 2022-12-31 DIAGNOSIS — E1169 Type 2 diabetes mellitus with other specified complication: Secondary | ICD-10-CM

## 2022-12-31 DIAGNOSIS — E785 Hyperlipidemia, unspecified: Secondary | ICD-10-CM

## 2022-12-31 DIAGNOSIS — I25118 Atherosclerotic heart disease of native coronary artery with other forms of angina pectoris: Secondary | ICD-10-CM | POA: Diagnosis not present

## 2022-12-31 DIAGNOSIS — I10 Essential (primary) hypertension: Secondary | ICD-10-CM

## 2022-12-31 DIAGNOSIS — Z7984 Long term (current) use of oral hypoglycemic drugs: Secondary | ICD-10-CM | POA: Diagnosis not present

## 2022-12-31 DIAGNOSIS — I5022 Chronic systolic (congestive) heart failure: Secondary | ICD-10-CM | POA: Insufficient documentation

## 2022-12-31 LAB — POCT GLYCOSYLATED HEMOGLOBIN (HGB A1C): Hemoglobin A1C: 7.2 % — AB (ref 4.0–5.6)

## 2022-12-31 NOTE — Patient Instructions (Signed)
.   Please review the attached list of medications and notify my office if there are any errors.   . Please bring all of your medications to every appointment so we can make sure that our medication list is the same as yours.   

## 2022-12-31 NOTE — Progress Notes (Unsigned)
Established patient visit   Patient: Ronald Malone   DOB: [birth year]   67 y.o. Male  MRN: [id number] Visit Date: 12/31/2022  Today's healthcare provider: Mila Merry, MD   Chief Complaint  Patient presents with   Follow-up    Pt states he does not know why he is here.    Subjective    Discussed the use of AI scribe software for clinical note transcription with the patient, who gave verbal consent to proceed.  History of Present Illness   Patient with a history of diabetes, hypertension, and heart disease, presents for a follow-up visit. The patient reports a significant improvement in his blood glucose control, with a recent HbA1c of 7.2, down from 8.8. This improvement is attributed to dietary changes and a weight loss of 10 pounds over the past few months. However, the patient notes a lack of energy, which he attributes to boredom.  The patient also reports experiencing chest pain upon waking in the morning, which resolves upon getting up and moving around. He speculates that this may be due to the position he sleeps in. No other cardiac symptoms such as palpitations or shortness of breath were reported.  The patient's blood pressure has been well-controlled on his current regimen, which was adjusted six weeks ago by his cardiologist. He reports no issues with his current medications, which include metformin for diabetes and medications for blood pressure control.  The patient also mentions a recent cardiac MRI ordered by his cardiologist, which was performed a few weeks ago His EF was reported at 48% and was started on losartan and metoprolol by Dr. Juliann Pares. Marland Kitchen  Despite his health conditions, the patient remains active and is part of a softball team. He reports no issues with his current prescriptions and does not require any refills at this time. The patient is scheduled for a follow-up visit in four months to monitor his HbA1c levels.       Medications: Outpatient  Medications Prior to Visit  Medication Sig   acetaminophen (TYLENOL) 650 MG CR tablet Take by mouth.   apixaban (ELIQUIS) 2.5 MG TABS tablet Take 1 tablet (2.5 mg total) by mouth 2 (two) times daily.   atorvastatin (LIPITOR) 20 MG tablet Take 20 mg by mouth daily.   azelastine (ASTELIN) 0.1 % nasal spray Place into both nostrils 2 (two) times daily. Use in each nostril as directed   latanoprost (XALATAN) 0.005 % ophthalmic solution Place 1 drop into both eyes at bedtime.   losartan (COZAAR) 50 MG tablet Take 1 tablet by mouth daily.   metFORMIN (GLUCOPHAGE-XR) 500 MG 24 hr tablet TAKE 2 TABLETS BY MOUTH TWICE A DAY   metoprolol succinate (TOPROL-XL) 50 MG 24 hr tablet Take 1 tablet by mouth daily.   COMBIVENT RESPIMAT 20-100 MCG/ACT AERS respimat Inhale 1 puff into the lungs every 6 (six) hours as needed. (Patient not taking: Reported on 12/31/2022)   nitroGLYCERIN (NITROSTAT) 0.4 MG SL tablet Place 1 tablet (0.4 mg total) under the tongue every 5 (five) minutes as needed for chest pain. (Patient not taking: Reported on 12/31/2022)   [DISCONTINUED] Semaglutide,0.25 or 0.5MG /DOS, (OZEMPIC, 0.25 OR 0.5 MG/DOSE,) 2 MG/3ML SOPN Inject 0.5 mg as directed once a week. (Patient not taking: Reported on 12/31/2022)   No facility-administered medications prior to visit.   Review of Systems  Constitutional:  Positive for fatigue. Negative for appetite change, chills and fever.  Respiratory:  Negative for chest tightness, shortness of breath and  wheezing.   Cardiovascular:  Negative for chest pain and palpitations.  Gastrointestinal:  Negative for abdominal pain, nausea and vomiting.      Objective    BP (!) 110/57 (BP Location: Right Arm)   Pulse 81   Temp 97.9 F (36.6 C) (Oral)   Ht 6' (1.829 m)   Wt (!) 323 lb 1.6 oz (146.6 kg)   SpO2 97%   BMI 43.82 kg/m {Insert last BP/Wt (optional):23777}{See vitals history (optional):1}  Physical Exam  Physical Exam   VITALS: BP- 158/86     Results  for orders placed or performed in visit on 12/31/22  POCT glycosylated hemoglobin (Hb A1C)  Result Value Ref Range   Hemoglobin A1C 7.2 (A) 4.0 - 5.6 %    Assessment & Plan        Type 2 Diabetes Mellitus Improved glycemic control with recent A1c of 7.2, down from 8.8. Patient has made dietary changes and lost weight. Unable to afford Ozempic. -Continue current management with Metformin and lifestyle modifications. -Consider adding Ozempic if insurance coverage improves in the new year and if A1c remains above 7.  Hypertension Well controlled on current regimen as prescribed by cardiologist. Recent BP readings within target range. -Continue current antihypertensive medications.  Cardiovascular Disease History of stent placement. Recent cardiac MRI showed EF at 48%. No new symptoms of chest pain or shortness of breath. -Continue current cardiovascular medications to maintain cardiac function.  Follow-up in 4 months (February 2025) to reassess A1c and overall health status.    Return in about 4 months (around 05/03/2023) for Hypertension, Diabetes.     Continue regular cardiology follow up with Dr. Juliann Pares  COPD Encourage smoking cessation.  UTD LDCT  Mila Merry, MD  [facility] [phone] (phone) [phone] (fax)  [facility]

## 2023-01-20 ENCOUNTER — Other Ambulatory Visit: Payer: Self-pay | Admitting: Family Medicine

## 2023-01-20 DIAGNOSIS — I82431 Acute embolism and thrombosis of right popliteal vein: Secondary | ICD-10-CM

## 2023-01-21 NOTE — Telephone Encounter (Signed)
Requested Prescriptions  Pending Prescriptions Disp Refills   apixaban (ELIQUIS) 2.5 MG TABS tablet [Pharmacy Med Name: ELIQUIS 2.5 MG TABLET] 180 tablet 2    Sig: TAKE 1 TABLET BY MOUTH TWICE A DAY     Hematology:  Anticoagulants - apixaban Passed - 01/20/2023  2:36 PM      Passed - PLT in normal range and within 360 days    Platelets  Date Value Ref Range Status  09/29/2022 235 150 - 450 x10E3/uL Final         Passed - HGB in normal range and within 360 days    Hemoglobin  Date Value Ref Range Status  09/29/2022 14.0 13.0 - 17.7 g/dL Final         Passed - HCT in normal range and within 360 days    Hematocrit  Date Value Ref Range Status  09/29/2022 41.2 37.5 - 51.0 % Final         Passed - Cr in normal range and within 360 days    Creatinine, Ser  Date Value Ref Range Status  09/29/2022 0.87 0.76 - 1.27 mg/dL Final         Passed - AST in normal range and within 360 days    AST  Date Value Ref Range Status  09/29/2022 19 0 - 40 IU/L Final         Passed - ALT in normal range and within 360 days    ALT  Date Value Ref Range Status  09/29/2022 30 0 - 44 IU/L Final         Passed - Valid encounter within last 12 months    Recent Outpatient Visits           3 weeks ago Type 2 diabetes mellitus with hyperglycemia, without long-term current use of insulin (HCC)   [place] [facility] Malva Limes, MD   3 months ago Annual physical exam   [facility] Malva Limes, MD   1 year ago Acute pain of right knee   [facility] Malva Limes, MD   1 year ago Type 2 diabetes mellitus with diabetic polyneuropathy, without long-term current use of insulin (HCC)   [place] [facility] Malva Limes, MD   2 years ago Type 2 diabetes mellitus with diabetic polyneuropathy, without long-term current use of insulin (HCC)   [place] [facility] Malva Limes, MD        Future Appointments             In 3 months Fisher, Demetrios Isaacs, MD [facility]

## 2023-03-11 ENCOUNTER — Other Ambulatory Visit: Payer: Self-pay | Admitting: Family Medicine

## 2023-03-11 DIAGNOSIS — E1142 Type 2 diabetes mellitus with diabetic polyneuropathy: Secondary | ICD-10-CM

## 2023-03-16 ENCOUNTER — Encounter: Payer: Self-pay | Admitting: Family Medicine

## 2023-03-17 ENCOUNTER — Other Ambulatory Visit: Payer: Self-pay

## 2023-03-23 DIAGNOSIS — H40003 Preglaucoma, unspecified, bilateral: Secondary | ICD-10-CM | POA: Diagnosis not present

## 2023-03-23 DIAGNOSIS — Z961 Presence of intraocular lens: Secondary | ICD-10-CM | POA: Diagnosis not present

## 2023-03-23 DIAGNOSIS — E119 Type 2 diabetes mellitus without complications: Secondary | ICD-10-CM | POA: Diagnosis not present

## 2023-04-26 ENCOUNTER — Ambulatory Visit: Payer: Medicare Other

## 2023-04-26 DIAGNOSIS — Z0000 Encounter for general adult medical examination without abnormal findings: Secondary | ICD-10-CM

## 2023-04-26 NOTE — Progress Notes (Signed)
Subjective:   Ronald Malone is a 68 y.o. male who presents for Medicare Annual/Subsequent preventive examination.  Visit Complete: Virtual I connected with  Eduard Clos on 04/26/23 by a audio enabled telemedicine application and verified that I am speaking with the correct person using two identifiers.  This patient declined Interactive audio and Acupuncturist. Therefore the visit was completed with audio only.   Patient Location: Home  Provider Location: Office/Clinic  I discussed the limitations of evaluation and management by telemedicine. The patient expressed understanding and agreed to proceed.  Vital Signs: Because this visit was a virtual/telehealth visit, some criteria may be missing or patient reported. Any vitals not documented were not able to be obtained and vitals that have been documented are patient reported.  Cardiac Risk Factors include: advanced age (>63men, >43 women);dyslipidemia;diabetes mellitus;male gender;sedentary lifestyle;obesity (BMI >30kg/m2);smoking/ tobacco exposure     Objective:    There were no vitals filed for this visit. There is no height or weight on file to calculate BMI.     04/26/2023   10:23 AM 11/03/2022    3:54 PM 04/20/2022   10:56 AM 01/13/2022    6:54 AM 04/16/2021    9:48 AM 04/10/2020    9:50 AM 04/01/2020    9:10 AM  Advanced Directives  Does Patient Have a Medical Advance Directive? No No No No No No No  Would patient like information on creating a medical advance directive? No - Patient declined No - Patient declined No - Patient declined No - Patient declined No - Patient declined No - Patient declined No - Patient declined    Current Medications (verified) Outpatient Encounter Medications as of 04/26/2023  Medication Sig   acetaminophen (TYLENOL) 650 MG CR tablet Take by mouth.   apixaban (ELIQUIS) 2.5 MG TABS tablet TAKE 1 TABLET BY MOUTH TWICE A DAY   atorvastatin (LIPITOR) 20 MG tablet Take 20 mg by mouth  daily.   azelastine (ASTELIN) 0.1 % nasal spray Place into both nostrils 2 (two) times daily. Use in each nostril as directed   COMBIVENT RESPIMAT 20-100 MCG/ACT AERS respimat Inhale 1 puff into the lungs every 6 (six) hours as needed.   losartan (COZAAR) 50 MG tablet Take 1 tablet by mouth daily.   metFORMIN (GLUCOPHAGE-XR) 500 MG 24 hr tablet TAKE 2 TABLETS BY MOUTH TWICE A DAY   metoprolol succinate (TOPROL-XL) 50 MG 24 hr tablet Take 1 tablet by mouth daily.   nitroGLYCERIN (NITROSTAT) 0.4 MG SL tablet Place 1 tablet (0.4 mg total) under the tongue every 5 (five) minutes as needed for chest pain.   latanoprost (XALATAN) 0.005 % ophthalmic solution Place 1 drop into both eyes at bedtime.   No facility-administered encounter medications on file as of 04/26/2023.    Allergies (verified) Patient has no known allergies.   History: Past Medical History:  Diagnosis Date   Cataract 9/22   CHF (congestive heart failure) (HCC)    Clotting disorder (HCC)    MI (myocardial infarction) (HCC) 2015   Peripheral neuropathy    Pulmonary embolism (HCC) 2020   Sleep apnea    CPAP   Torn meniscus    right   Wears dentures    full upper, partial lower   Wears hearing aid in right ear    Past Surgical History:  Procedure Laterality Date   CATARACT EXTRACTION W/PHACO Right 01/13/2022   Procedure: CATARACT EXTRACTION PHACO AND INTRAOCULAR LENS PLACEMENT (IOC) RIGHT 9.56 01:34.8;  Surgeon: Lockie Mola,  MD;  Location: [facility];  Service: Ophthalmology;  Laterality: Right;  Diabetic   CATARACT EXTRACTION W/PHACO Left 01/27/2022   Procedure: CATARACT EXTRACTION PHACO AND INTRAOCULAR LENS PLACEMENT (IOC) LEFT 3.42  00:52.2;  Surgeon: Lockie Mola, MD;  Location: [facility];  Service: Ophthalmology;  Laterality: Left;  Diabetic   COLON SURGERY     1/3 colon removed as an infant   COLONOSCOPY WITH PROPOFOL N/A 12/26/2018   Procedure: COLONOSCOPY WITH PROPOFOL;   Surgeon: Midge Minium, MD;  Location: [facility] ENDOSCOPY;  Service: Endoscopy;  Laterality: N/A;   COLONOSCOPY WITH PROPOFOL N/A 04/01/2020   Procedure: COLONOSCOPY WITH PROPOFOL;  Surgeon: Midge Minium, MD;  Location: [facility] ENDOSCOPY;  Service: Endoscopy;  Laterality: N/A;   CORONARY ANGIOPLASTY WITH STENT PLACEMENT  2015   DES STENT POST MI, [facility]   LOWER EXTREMITY ANGIOGRAPHY Right 10/23/2018   Procedure: LOWER EXTREMITY ANGIOGRAPHY;  Surgeon: Annice Needy, MD;  Location: [facility] INVASIVE CV LAB;  Service: Cardiovascular;  Laterality: Right;   SMALL INTESTINE SURGERY  1955   Family History  Problem Relation Age of Onset   Parkinson's disease Mother    Dementia Father    Social History   Socioeconomic History   Marital status: Married    Spouse name: Lupita Leash    Number of children: 2   Years of education: Not on file   Highest education level: Bachelor's degree (e.g., BA, AB, BS)  Occupational History   Occupation: RETIRED   Tobacco Use   Smoking status: Every Day    Current packs/day: 2.00    Average packs/day: 2.0 packs/day for 47.1 years (94.2 ttl pk-yrs)    Types: Cigarettes    Start date: 89   Smokeless tobacco: Never  Vaping Use   Vaping status: Never Used  Substance and Sexual Activity   Alcohol use: Yes    Alcohol/week: 2.0 standard drinks of alcohol    Types: 2 Cans of beer per week    Comment: rare   Drug use: Yes    Types: Marijuana    Comment: occasional   Sexual activity: Not Currently    Birth control/protection: None  Other Topics Concern   Not on file  Social History Narrative   Not on file   Social Drivers of Health   Financial Resource Strain: Low Risk  (04/26/2023)   Overall Financial Resource Strain (CARDIA)    Difficulty of Paying Living Expenses: Not hard at all  Food Insecurity: No Food Insecurity (04/26/2023)   Hunger Vital Sign    Worried About Running Out of Food in the Last Year: Never true    Ran Out of Food in the Last Year:  Never true  Transportation Needs: No Transportation Needs (04/26/2023)   PRAPARE - Administrator, Civil Service (Medical): No    Lack of Transportation (Non-Medical): No  Physical Activity: Inactive (04/26/2023)   Exercise Vital Sign    Days of Exercise per Week: 0 days    Minutes of Exercise per Session: 0 min  Stress: No Stress Concern Present (04/26/2023)   [employer] of Occupational Health - Occupational Stress Questionnaire    Feeling of Stress : Not at all  Social Connections: Moderately Isolated (04/26/2023)   Social Connection and Isolation Panel [NHANES]    Frequency of Communication with Friends and Family: Once a week    Frequency of Social Gatherings with Friends and Family: Once a week    Attends Religious Services: Never    Active  Member of Clubs or Organizations: Yes    Attends Banker Meetings: Never    Marital Status: Married    Tobacco Counseling Ready to quit: Not Answered Counseling given: Not Answered   Clinical Intake:  Pre-visit preparation completed: Yes  Pain : No/denies pain     BMI - recorded: 43.8 Nutritional Status: BMI > 30  Obese Nutritional Risks: None Diabetes: Yes CBG done?: No Did pt. bring in CBG monitor from home?: No  How often do you need to have someone help you when you read instructions, pamphlets, or other written materials from your doctor or pharmacy?: 1 - Never  Interpreter Needed?: No  Information entered by :: Kennedy Bucker, LPN   Activities of Daily Living    04/26/2023   10:24 AM 04/22/2023   12:40 PM  In your present state of health, do you have any difficulty performing the following activities:  Hearing? 1 1  Vision? 0 0  Difficulty concentrating or making decisions? 0 0  Walking or climbing stairs? 1 1  Dressing or bathing? 0 0  Doing errands, shopping? 0 0  Preparing Food and eating ? N N  Using the Toilet? N N  In the past six months, have you accidently leaked urine? Y Y   Do you have problems with loss of bowel control? N N  Managing your Medications? N N  Managing your Finances? N N  Housekeeping or managing your Housekeeping? N N    Patient Care Team: Malva Limes, MD as PCP - General (Family Medicine) Midge Minium, MD as Consulting Physician (Gastroenterology) Mertie Moores, MD as Referring Physician (Specialist) Alwyn Pea, MD as Consulting Physician (Cardiology) Dingeldein, Viviann Spare, MD (Ophthalmology) Wyn Quaker Marlow Baars, MD as Referring Physician (Vascular Surgery) [facility], [facility] (Optometry)  Indicate any recent Medical Services you may have received from other than [facility] providers in the past year (date may be approximate).     Assessment:   This is a routine wellness examination for Baylee.  Hearing/Vision screen Hearing Screening - Comments:: WEARS AIDS, RIGHT EAR Vision Screening - Comments:: READERS- DR.DINGELDEIN   Goals Addressed             This Visit's Progress    DIET - INCREASE WATER INTAKE         Depression Screen    04/26/2023   10:21 AM 12/31/2022    9:43 AM 11/03/2022    3:54 PM 09/29/2022    9:39 AM 04/20/2022   10:51 AM 04/16/2021    9:46 AM 07/11/2020   11:27 AM  PHQ 2/9 Scores  PHQ - 2 Score 0 0 0 0 0 0 0  PHQ- 9 Score 0 0         Fall Risk    04/26/2023   10:23 AM 04/22/2023   12:40 PM 12/31/2022    9:43 AM 11/03/2022    3:54 PM 09/29/2022    9:39 AM  Fall Risk   Falls in the past year? 0 1 0 0 0  Number falls in past yr: 0 0 0  0  Injury with Fall? 0 0 0  0  Risk for fall due to : No Fall Risks  No Fall Risks  No Fall Risks  Follow up Falls prevention discussed;Falls evaluation completed  Falls evaluation completed  Falls evaluation completed    MEDICARE RISK AT HOME: Medicare Risk at Home Any stairs in or around the home?: Yes If so, are there any without handrails?:  No Home free of loose throw rugs in walkways, pet beds, electrical cords, etc?: No Adequate lighting in your home to  reduce risk of falls?: Yes Life alert?: No Use of a cane, walker or w/c?: No Grab bars in the bathroom?: No Shower chair or bench in shower?: No Elevated toilet seat or a handicapped toilet?: No  TIMED UP AND GO:  Was the test performed?  No    Cognitive Function:        04/26/2023   10:25 AM 04/20/2022   11:14 AM  6CIT Screen  What Year? 0 points 0 points  What month? 0 points 0 points  What time? 0 points 0 points  Count back from 20 0 points 0 points  Months in reverse 0 points 0 points  Repeat phrase 2 points 0 points  Total Score 2 points 0 points    Immunizations Immunization History  Administered Date(s) Administered   Fluad Quad(high Dose 65+) 01/02/2019, 01/23/2020, 12/30/2021   Influenza, High Dose Seasonal PF 12/16/2020, 12/06/2022   Moderna Covid-19 Fall Seasonal Vaccine 68yrs & older 02/03/2022, 12/06/2022   Moderna Covid-19 Vaccine Bivalent Booster 47yrs & up 12/16/2020, 10/07/2021   Moderna Sars-Covid-2 Vaccination 03/28/2019, 04/25/2019, 01/07/2020   Pneumococcal Conjugate-13 10/18/2018   Pneumococcal Polysaccharide-23 03/24/2020   Tdap 09/18/2019   Zoster Recombinant(Shingrix) 02/11/2020, 04/24/2020    TDAP status: Up to date  Flu Vaccine status: Up to date  Pneumococcal vaccine status: Up to date  Covid-19 vaccine status: Completed vaccines  Qualifies for Shingles Vaccine? Yes   Zostavax completed No   Shingrix Completed?: Yes  Screening Tests Health Maintenance  Topic Date Due   Diabetic kidney evaluation - Urine ACR  03/17/2022   COVID-19 Vaccine (8 - 2024-25 season) 01/31/2023   HEMOGLOBIN A1C  07/01/2023   Lung Cancer Screening  07/30/2023   OPHTHALMOLOGY EXAM  08/25/2023   Diabetic kidney evaluation - eGFR measurement  09/29/2023   Medicare Annual Wellness (AWV)  04/25/2024   Colonoscopy  04/01/2025   DTaP/Tdap/Td (2 - Td or Tdap) 09/17/2029   Pneumonia Vaccine 56+ Years old  Completed   INFLUENZA VACCINE  Completed   Hepatitis C  Screening  Completed   Zoster Vaccines- Shingrix  Completed   HPV VACCINES  Aged Out    Health Maintenance  Health Maintenance Due  Topic Date Due   Diabetic kidney evaluation - Urine ACR  03/17/2022   COVID-19 Vaccine (8 - 2024-25 season) 01/31/2023    Colorectal cancer screening: Type of screening: Colonoscopy. Completed 04/01/20. Repeat every 5 years  Lung Cancer Screening: (Low Dose CT Chest recommended if Age 54-80 years, 20 pack-year currently smoking OR have quit w/in 15years.) does qualify.   Lung Cancer Screening Referral: CT SCAN 07/30/22  Additional Screening:  Hepatitis C Screening: does qualify; Completed 10/18/18  Vision Screening: Recommended annual ophthalmology exams for early detection of glaucoma and other disorders of the eye. Is the patient up to date with their annual eye exam?  Yes  Who is the provider or what is the name of the office in which the patient attends annual eye exams? DR.DINGELDEIN If pt is not established with a provider, would they like to be referred to a provider to establish care? No .   Dental Screening: Recommended annual dental exams for proper oral hygiene  Diabetic Foot Exam: Diabetic Foot Exam: Overdue, Pt has been advised about the importance in completing this exam. Pt is scheduled for diabetic foot exam on 00.  Community Resource Referral /  Chronic Care Management: CRR required this visit?  No   CCM required this visit?  No     Plan:     I have personally reviewed and noted the following in the patient's chart:   Medical and social history Use of alcohol, tobacco or illicit drugs  Current medications and supplements including opioid prescriptions. Patient is not currently taking opioid prescriptions. Functional ability and status Nutritional status Physical activity Advanced directives List of other physicians Hospitalizations, surgeries, and ER visits in previous 12 months Vitals Screenings to include cognitive,  depression, and falls Referrals and appointments  In addition, I have reviewed and discussed with patient certain preventive protocols, quality metrics, and best practice recommendations. A written personalized care plan for preventive services as well as general preventive health recommendations were provided to patient.     Hal Hope, LPN   [id number]   After Visit Summary: (MyChart) Due to this being a telephonic visit, the after visit summary with patients personalized plan was offered to patient via MyChart   Nurse Notes: NONE

## 2023-04-26 NOTE — Patient Instructions (Addendum)
Ronald Malone , Thank you for taking time to come for your Medicare Wellness Visit. I appreciate your ongoing commitment to your health goals. Please review the following plan we discussed and let me know if I can assist you in the future.   Referrals/Orders/Follow-Ups/Clinician Recommendations: NONE  This is a list of the screening recommended for you and due dates:  Health Maintenance  Topic Date Due   Yearly kidney health urinalysis for diabetes  03/17/2022   COVID-19 Vaccine (8 - 2024-25 season) 01/31/2023   Hemoglobin A1C  07/01/2023   Screening for Lung Cancer  07/30/2023   Eye exam for diabetics  08/25/2023   Yearly kidney function blood test for diabetes  09/29/2023   Medicare Annual Wellness Visit  04/25/2024   Colon Cancer Screening  04/01/2025   DTaP/Tdap/Td vaccine (2 - Td or Tdap) 09/17/2029   Pneumonia Vaccine  Completed   Flu Shot  Completed   Hepatitis C Screening  Completed   Zoster (Shingles) Vaccine  Completed   HPV Vaccine  Aged Out    Advanced directives: (ACP Link)Information on Advanced Care Planning can be found at [facility] Advance Health Care Directives Advance Health Care Directives ([email or web address]   Next Medicare Annual Wellness Visit scheduled for next year: Yes   05/01/24 @ 11:30 AM BY PHONE

## 2023-04-28 DIAGNOSIS — E782 Mixed hyperlipidemia: Secondary | ICD-10-CM | POA: Diagnosis not present

## 2023-04-28 DIAGNOSIS — I219 Acute myocardial infarction, unspecified: Secondary | ICD-10-CM | POA: Diagnosis not present

## 2023-04-28 DIAGNOSIS — Z86718 Personal history of other venous thrombosis and embolism: Secondary | ICD-10-CM | POA: Diagnosis not present

## 2023-04-28 DIAGNOSIS — I25118 Atherosclerotic heart disease of native coronary artery with other forms of angina pectoris: Secondary | ICD-10-CM | POA: Diagnosis not present

## 2023-04-28 DIAGNOSIS — G4733 Obstructive sleep apnea (adult) (pediatric): Secondary | ICD-10-CM | POA: Diagnosis not present

## 2023-04-28 DIAGNOSIS — F17200 Nicotine dependence, unspecified, uncomplicated: Secondary | ICD-10-CM | POA: Diagnosis not present

## 2023-04-28 DIAGNOSIS — E119 Type 2 diabetes mellitus without complications: Secondary | ICD-10-CM | POA: Diagnosis not present

## 2023-04-28 DIAGNOSIS — Z955 Presence of coronary angioplasty implant and graft: Secondary | ICD-10-CM | POA: Diagnosis not present

## 2023-05-06 ENCOUNTER — Ambulatory Visit: Payer: Medicare Other | Admitting: Family Medicine

## 2023-05-10 ENCOUNTER — Encounter: Payer: Self-pay | Admitting: Family Medicine

## 2023-05-10 ENCOUNTER — Ambulatory Visit: Payer: Medicare Other | Admitting: Family Medicine

## 2023-05-10 VITALS — BP 128/72 | HR 68 | Resp 16 | Ht 72.0 in | Wt 329.5 lb

## 2023-05-10 DIAGNOSIS — E1169 Type 2 diabetes mellitus with other specified complication: Secondary | ICD-10-CM

## 2023-05-10 DIAGNOSIS — E1165 Type 2 diabetes mellitus with hyperglycemia: Secondary | ICD-10-CM | POA: Diagnosis not present

## 2023-05-10 DIAGNOSIS — I25118 Atherosclerotic heart disease of native coronary artery with other forms of angina pectoris: Secondary | ICD-10-CM | POA: Diagnosis not present

## 2023-05-10 DIAGNOSIS — E785 Hyperlipidemia, unspecified: Secondary | ICD-10-CM

## 2023-05-10 DIAGNOSIS — K760 Fatty (change of) liver, not elsewhere classified: Secondary | ICD-10-CM | POA: Diagnosis not present

## 2023-05-10 DIAGNOSIS — G2581 Restless legs syndrome: Secondary | ICD-10-CM

## 2023-05-10 LAB — POCT GLYCOSYLATED HEMOGLOBIN (HGB A1C)
Est. average glucose Bld gHb Est-mCnc: 203
Hemoglobin A1C: 8.7 % — AB (ref 4.0–5.6)

## 2023-05-10 NOTE — Patient Instructions (Signed)
 Please review the attached list of medications and notify my office if there are any errors.   I recommend that you get the RSV vaccine (Arexvy) to protect yourself from certain types of viral pneumonia. This vaccine is typically covered under Medicare prescription plans and should be covered if you get it at your pharmacy.

## 2023-05-11 LAB — SPECIMEN STATUS REPORT

## 2023-05-11 LAB — MICROALBUMIN / CREATININE URINE RATIO
Creatinine, Urine: 53.9 mg/dL
Microalb/Creat Ratio: 31 mg/g{creat} — ABNORMAL HIGH (ref 0–29)
Microalbumin, Urine: 16.8 ug/mL

## 2023-05-16 MED ORDER — ACCU-CHEK AVIVA PLUS W/DEVICE KIT
PACK | 0 refills | Status: DC
Start: 2023-05-16 — End: 2023-06-03

## 2023-05-16 MED ORDER — ACCU-CHEK AVIVA PLUS VI STRP
ORAL_STRIP | 4 refills | Status: DC
Start: 2023-05-16 — End: 2023-06-03

## 2023-05-16 NOTE — Progress Notes (Signed)
 Established patient visit   Patient: Ronald Malone   DOB: 21-Jan-1956   68 y.o. Male  MRN: [id number] Visit Date: 05/10/2023  Today's healthcare provider: Mila Merry, MD   Chief Complaint  Patient presents with   Medical Management of Chronic Issues   Subjective    HPI  Patient with history CAD and sleep apnea present for diabetes follow up. He checks glucose daily and is in need of new glucometer. His insurance a covers [place] and [employer] or [employer]iva. He is taking his medications consistently with no side effects.   He reports a new issue with sleep disturbances over the past few weeks, characterized by waking up after an hour of sleep with bothersome leg sensations. He previously managed similar symptoms with acetaminophen 1000 mg at night, which was effective until recently. He describes the leg sensations as possibly restless legs, requiring him to get up and move around to alleviate the discomfort. He has not taken gabapentin, which was previously prescribed, and is unsure if he has developed a tolerance to acetaminophen.  He would like to try Ozempic, but is currently cost prohibitive. However he expects to hit his OOP max in a few months and would like to start it at that time.   Lab Results  Component Value Date   HGBA1C 8.7 (A) 05/10/2023   HGBA1C 7.2 (A) 12/31/2022   HGBA1C 8.8 (H) 09/29/2022   Wt Readings from Last 3 Encounters:  05/10/23 (!) 329 lb 8 oz (149.5 kg)  12/31/22 (!) 323 lb 1.6 oz (146.6 kg)  11/03/22 (!) 330 lb 12.8 oz (150 kg)     Medications: Outpatient Medications Prior to Visit  Medication Sig   acetaminophen (TYLENOL) 650 MG CR tablet Take by mouth.   apixaban (ELIQUIS) 2.5 MG TABS tablet TAKE 1 TABLET BY MOUTH TWICE A DAY   atorvastatin (LIPITOR) 20 MG tablet Take 20 mg by mouth daily.   azelastine (ASTELIN) 0.1 % nasal spray Place into both nostrils 2 (two) times daily. Use in each nostril as directed   COMBIVENT  RESPIMAT 20-100 MCG/ACT AERS respimat Inhale 1 puff into the lungs every 6 (six) hours as needed.   losartan (COZAAR) 50 MG tablet Take 1 tablet by mouth daily.   metFORMIN (GLUCOPHAGE-XR) 500 MG 24 hr tablet TAKE 2 TABLETS BY MOUTH TWICE A DAY   metoprolol succinate (TOPROL-XL) 50 MG 24 hr tablet Take 1 tablet by mouth daily.   nitroGLYCERIN (NITROSTAT) 0.4 MG SL tablet Place 1 tablet (0.4 mg total) under the tongue every 5 (five) minutes as needed for chest pain.   latanoprost (XALATAN) 0.005 % ophthalmic solution Place 1 drop into both eyes at bedtime.   No facility-administered medications prior to visit.    Review of Systems  Constitutional:  Negative for appetite change, chills and fever.  Respiratory:  Negative for chest tightness, shortness of breath and wheezing.   Cardiovascular:  Negative for chest pain and palpitations.  Gastrointestinal:  Negative for abdominal pain, nausea and vomiting.       Objective    BP 128/72 (BP Location: Left Arm, Patient Position: Sitting, Cuff Size: Large)   Pulse 68   Resp 16   Ht 6' (1.829 m)   Wt (!) 329 lb 8 oz (149.5 kg)   BMI 44.69 kg/m    Physical Exam   General: Appearance:    Obese male in no acute distress  Eyes:    PERRL, conjunctiva/corneas clear, EOM's intact  Lungs:     Clear to auscultation bilaterally, respirations unlabored  Heart:    Normal heart rate. Normal rhythm. No murmurs, rubs, or gallops.    MS:   All extremities are intact.    Neurologic:   Awake, alert, oriented x 3. No apparent focal neurological defect.         Results for orders placed or performed in visit on 05/10/23  POCT glycosylated hemoglobin (Hb A1C)  Result Value Ref Range   Hemoglobin A1C 8.7 (A) 4.0 - 5.6 %   Est. average glucose Bld gHb Est-mCnc 203     Assessment & Plan     1. Type 2 diabetes mellitus with hyperglycemia, without long-term current use of insulin (HCC) (Primary)  A1c not at goal. He wants to start Ozempic when he  hist his OOP max. Advised we could go ahead and get him on samples and that there is no doughnut hold so he won't spend any more the OOP max this year regardless of when he hits. He will call back if decides to go ahead and start it.   - Blood Glucose Monitoring Suppl (ACCU-CHEK AVIVA PLUS) w/Device KIT; Use to check blood sugar daily for type 2 diabetes. E11.9  Dispense: 1 kit; Refill: 0 - glucose blood (ACCU-CHEK AVIVA PLUS) test strip; Use to check blood sugar once a day for type 2 diabetes E11.9  Dispense: 100 each; Refill: 4   2. Coronary artery disease of native artery of native heart with stable angina pectoris (HCC) Asymptomatic. Compliant with medication.  Continue aggressive risk factor modification.  Continue regular follow up cardiology.   5. Restless leg syndrome  - Vitamin B12 - Iron, TIBC and Ferritin Panel - TSH    Return in about 4 months (around 09/07/2023).         Mila Merry, MD  [facility] [phone] (phone) [phone] (fax)  [facility]

## 2023-05-31 IMAGING — CT CT CHEST LUNG CANCER SCREENING LOW DOSE W/O CM
2 of 5 series · 15 of 40 positions shown, 18 images · non-contrast
Comparison: CTA Chest 09/01/2018

CLINICAL DATA: 65-year-old male with 45 pack-year history of
smoking. Lung cancer screening.

EXAM:
CT CHEST WITHOUT CONTRAST LOW-DOSE FOR LUNG CANCER SCREENING
TECHNIQUE: Multidetector CT imaging of the chest was performed following the
standard protocol without IV contrast.

[Series 3: lung 1.00 · axial · 0.83mm/px · z∈[-1191,-910]mm · 12 of 311 slices shown, 15 images]
[im 15/311  mediastinal]
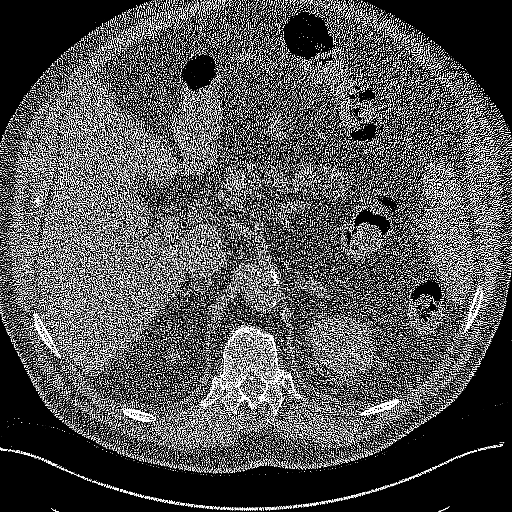
[im 15/311  lung]
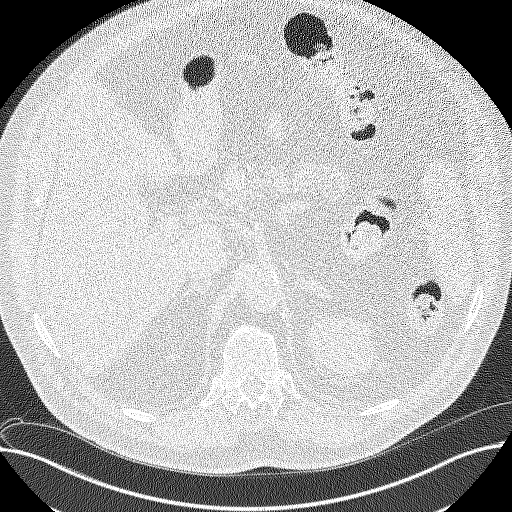
[im 45/311  lung]
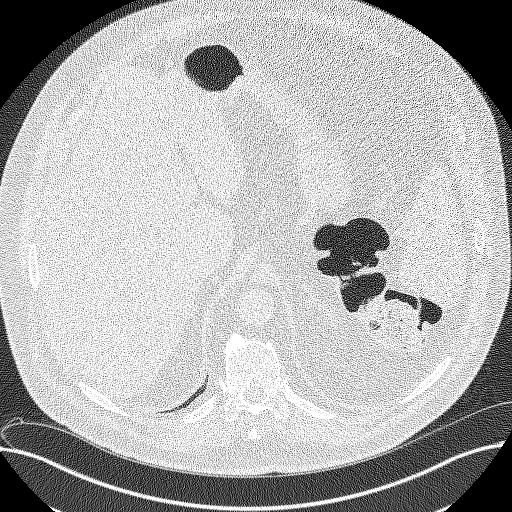
[im 74/311  lung]
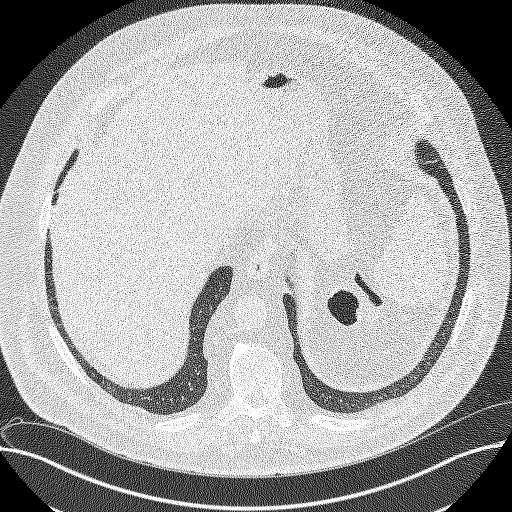
[im 89/311  lung]
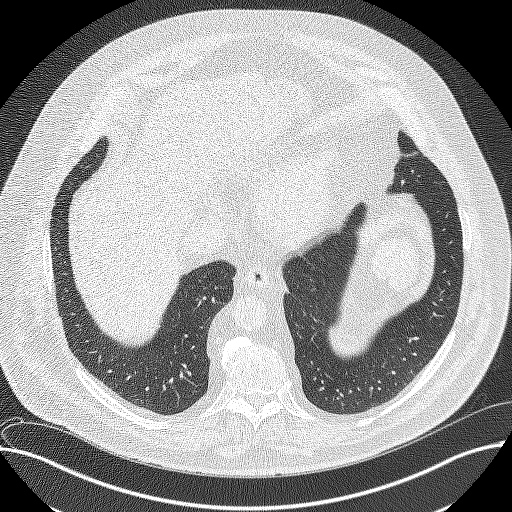
[im 119/311  mediastinal]
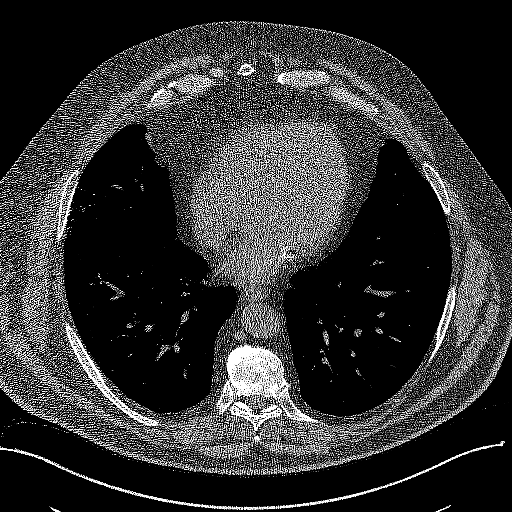
[im 119/311  lung]
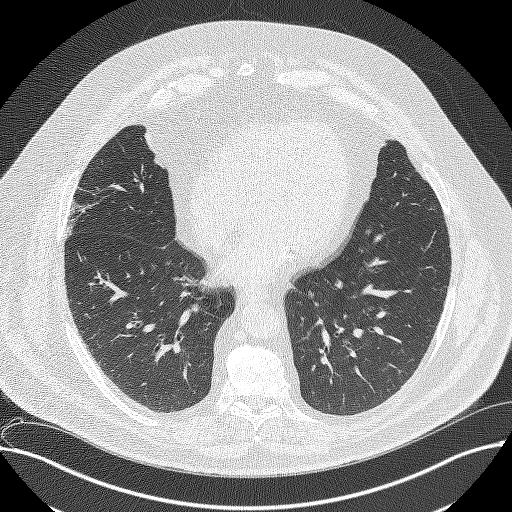
[im 148/311  lung]
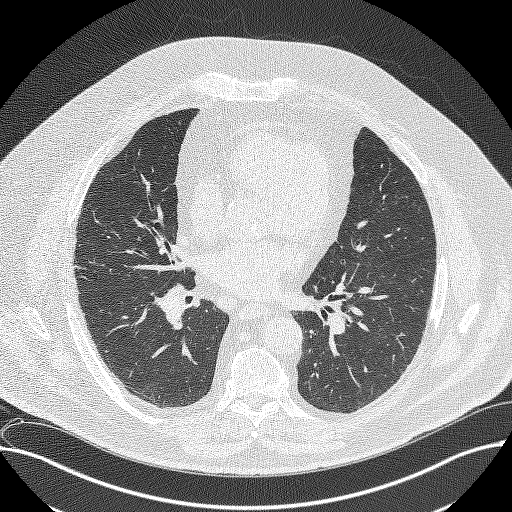
[im 163/311  lung]
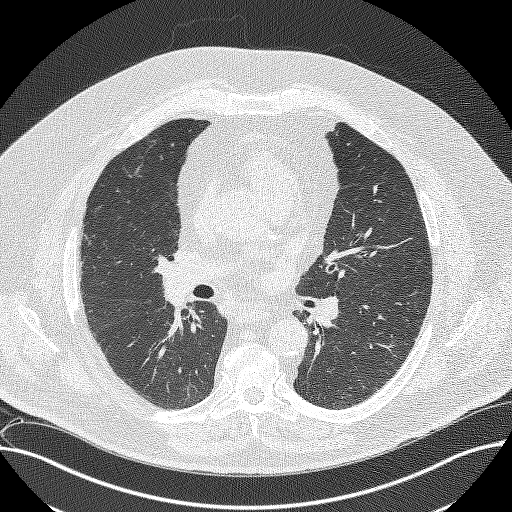
[im 192/311  lung]
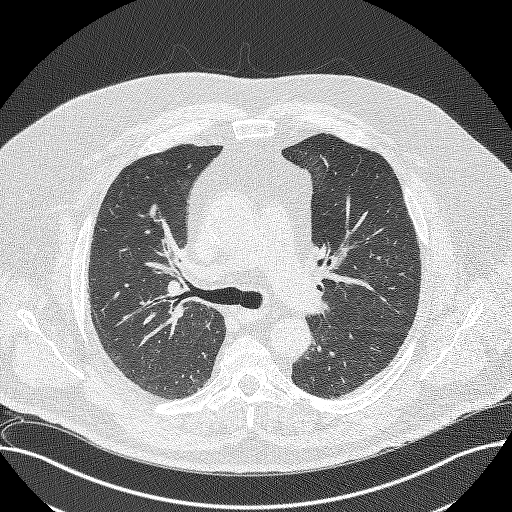
[im 222/311  mediastinal]
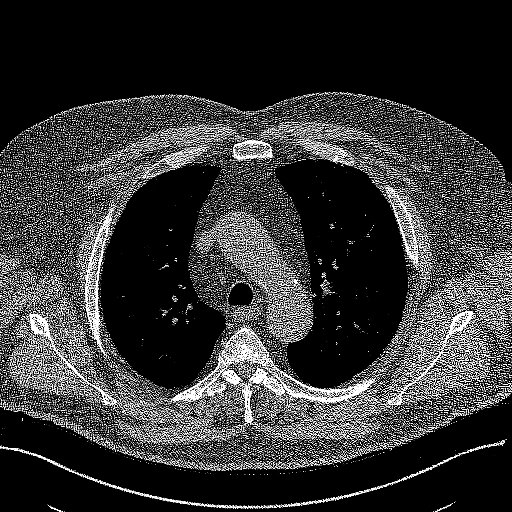
[im 222/311  lung]
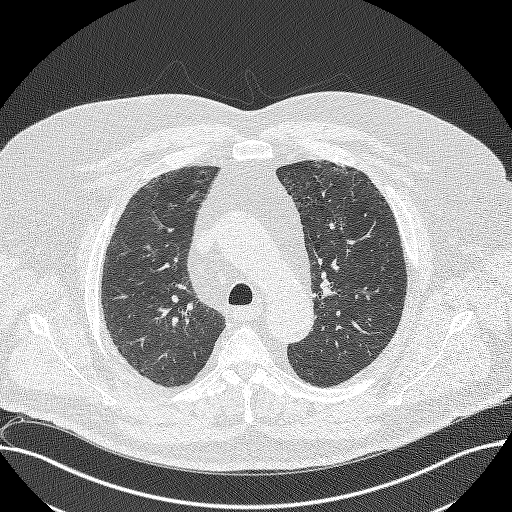
[im 237/311  lung]
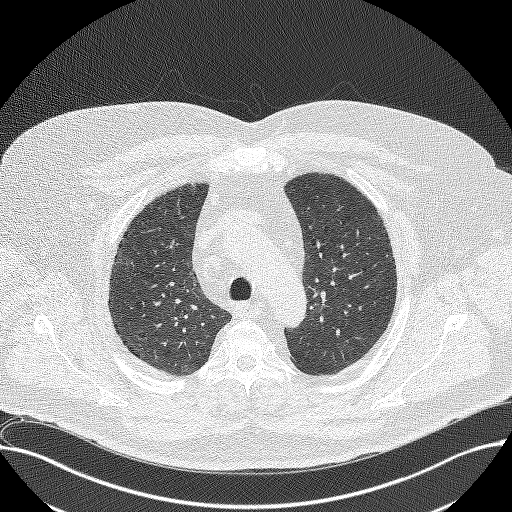
[im 266/311  lung]
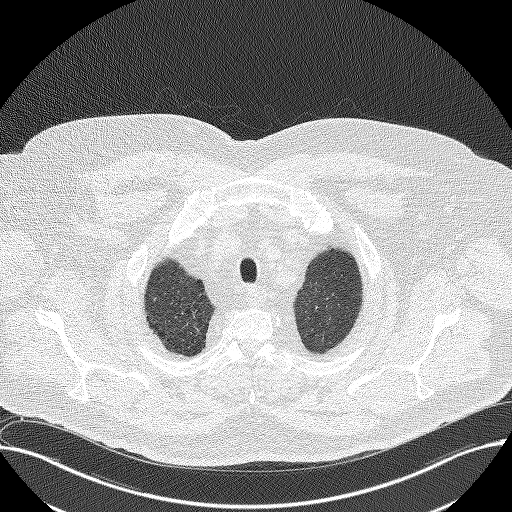
[im 296/311  lung]
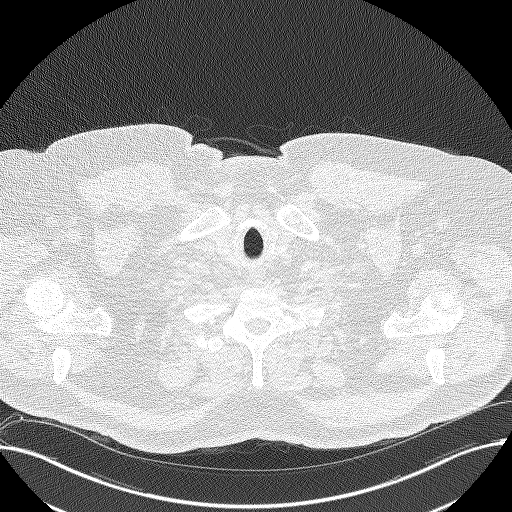

[Series 5: coronals lung 1.00 cor · coronal · 0.61mm/px · 3 of 394 slices shown]
[im 79/394  lung]
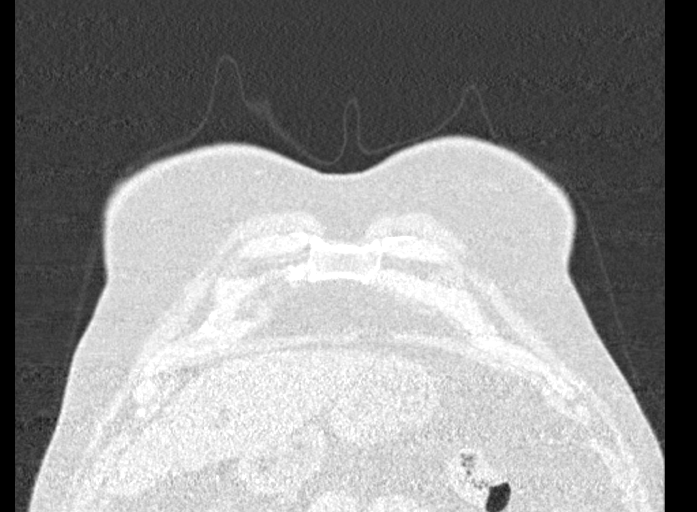
[im 158/394  lung]
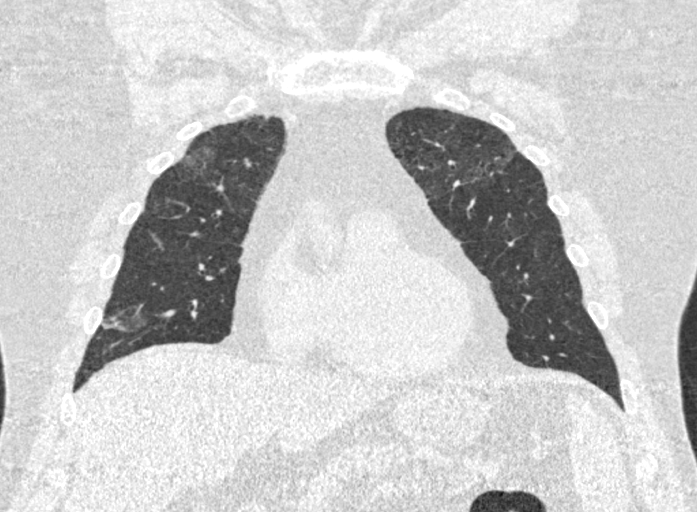
[im 236/394  lung]
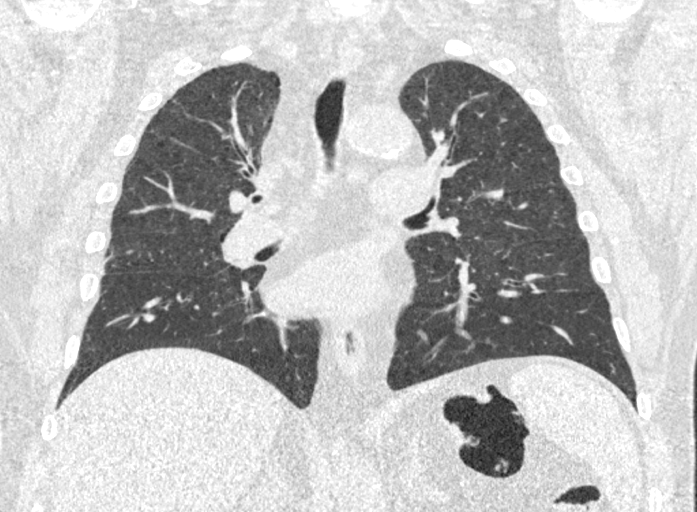

[15 of 40 positions shown; findings below may reference images not displayed]

FINDINGS: Cardiovascular: The heart size is normal. No substantial pericardial
effusion. Coronary artery calcification is evident. Atherosclerotic
calcification is noted in the wall of the thoracic aorta.

Mediastinum/Nodes: No mediastinal lymphadenopathy. No evidence for
gross hilar lymphadenopathy although assessment is limited by the
lack of intravenous contrast on today's study. The esophagus has
normal imaging features. There is no axillary lymphadenopathy.

Lungs/Pleura: Centrilobular and paraseptal emphysema evident.
Scattered tiny bilateral pulmonary nodules are evident. There is a
small area of atelectasis or scarring in the right middle lobe, new
since the 3535 exam. No suspicious pulmonary nodule or mass on
today's study. No consolidation. No pleural effusion.

Upper Abdomen: The liver shows diffusely decreased attenuation
suggesting fat deposition.

Musculoskeletal: No worrisome lytic or sclerotic osseous
abnormality.
IMPRESSION: 1. Lung-RADS 2, benign appearance or behavior. Continue annual
screening with low-dose chest CT without contrast in 12 months.
2. Hepatic steatosis.
3.  Emphysema (JQ1OC-JDY.1) and Aortic Atherosclerosis (JQ1OC-170.0)

## 2023-06-01 ENCOUNTER — Encounter: Payer: Self-pay | Admitting: Family Medicine

## 2023-06-03 ENCOUNTER — Telehealth: Payer: Self-pay | Admitting: Family Medicine

## 2023-06-03 DIAGNOSIS — E1165 Type 2 diabetes mellitus with hyperglycemia: Secondary | ICD-10-CM

## 2023-06-03 MED ORDER — ACCU-CHEK GUIDE W/DEVICE KIT
PACK | 0 refills | Status: AC
Start: 2023-06-03 — End: ?

## 2023-06-03 MED ORDER — ACCU-CHEK GUIDE TEST VI STRP
ORAL_STRIP | 12 refills | Status: AC
Start: 2023-06-03 — End: ?

## 2023-06-03 NOTE — Telephone Encounter (Signed)
 [facility] is requesting new RX  ACCU check guide metter & ACCU chek guide test strips  & ACCU check soft clicks AVIVA meter is no longer available Please advise

## 2023-06-23 DIAGNOSIS — H6122 Impacted cerumen, left ear: Secondary | ICD-10-CM | POA: Diagnosis not present

## 2023-06-23 DIAGNOSIS — H6531 Chronic mucoid otitis media, right ear: Secondary | ICD-10-CM | POA: Diagnosis not present

## 2023-06-23 DIAGNOSIS — J301 Allergic rhinitis due to pollen: Secondary | ICD-10-CM | POA: Diagnosis not present

## 2023-07-12 ENCOUNTER — Ambulatory Visit: Admitting: Podiatry

## 2023-07-12 ENCOUNTER — Ambulatory Visit (INDEPENDENT_AMBULATORY_CARE_PROVIDER_SITE_OTHER)

## 2023-07-12 ENCOUNTER — Encounter: Payer: Self-pay | Admitting: Podiatry

## 2023-07-12 DIAGNOSIS — G5763 Lesion of plantar nerve, bilateral lower limbs: Secondary | ICD-10-CM | POA: Diagnosis not present

## 2023-07-12 DIAGNOSIS — M06071 Rheumatoid arthritis without rheumatoid factor, right ankle and foot: Secondary | ICD-10-CM

## 2023-07-12 DIAGNOSIS — M069 Rheumatoid arthritis, unspecified: Secondary | ICD-10-CM

## 2023-07-12 DIAGNOSIS — M06072 Rheumatoid arthritis without rheumatoid factor, left ankle and foot: Secondary | ICD-10-CM

## 2023-07-12 NOTE — Progress Notes (Signed)
 Chief Complaint  Patient presents with   Diabetes    "I want to know if I have fungus, athlete's feet.  I have calluses.  The toe next to my big toe on both feet feel like they are broken or separated, once in a while.  I'm Diabetic and I have Neuropathy.  I guess I have Arthritis." N - calluses L - lateral and heel right D - 1 year O - gradually worse C - sore A - sitting with no shoes because my foot leans over T - soaked, lotion, pumice stone N - fungus L - 1-5 bilateral D - 1-2 years O - gradually worse C - discolored   A - no T - none    HPI: 68 y.o. male presenting today as a new patient for evaluation of pain to the bilateral feet.  Patient developed symptomatic calluses and as well as second toe pain occasionally.  Past Medical History:  Diagnosis Date   Cataract 9/22   CHF (congestive heart failure) (HCC)    Clotting disorder (HCC)    COPD (chronic obstructive pulmonary disease) (HCC)    Diabetes mellitus without complication (HCC)    Hypertension    MI (myocardial infarction) (HCC) 2015   Peripheral neuropathy    Pulmonary embolism (HCC) 2020   Sleep apnea    CPAP   Torn meniscus    right   Wears dentures    full upper, partial lower   Wears hearing aid in right ear     Past Surgical History:  Procedure Laterality Date   CATARACT EXTRACTION W/PHACO Right 01/13/2022   Procedure: CATARACT EXTRACTION PHACO AND INTRAOCULAR LENS PLACEMENT (IOC) RIGHT 9.56 01:34.8;  Surgeon: Annell Kidney, MD;  Location: [facility];  Service: Ophthalmology;  Laterality: Right;  Diabetic   CATARACT EXTRACTION W/PHACO Left 01/27/2022   Procedure: CATARACT EXTRACTION PHACO AND INTRAOCULAR LENS PLACEMENT (IOC) LEFT 3.42  00:52.2;  Surgeon: Annell Kidney, MD;  Location: [facility];  Service: Ophthalmology;  Laterality: Left;  Diabetic   COLON SURGERY     1/3 colon removed as an infant   COLONOSCOPY WITH PROPOFOL  N/A 12/26/2018   Procedure:  COLONOSCOPY WITH PROPOFOL ;  Surgeon: Marnee Sink, MD;  Location: [facility] ENDOS[facility];  Service: Endoscopy;  Laterality: N/A;   COLONOSCOPY WITH PROPOFOL  N/A 04/01/2020   Procedure: COLONOSCOPY WITH PROPOFOL ;  Surgeon: Marnee Sink, MD;  Location: [facility] ENDOSCOPY;  Service: Endoscopy;  Laterality: N/A;   CORONARY ANGIOPLASTY WITH STENT PLACEMENT  2015   DES STENT POST MI, [place]    LOWER EXTREMITY ANGIOGRAPHY Right 10/23/2018   Procedure: LOWER EXTREMITY ANGIOGRAPHY;  Surgeon: Celso College, MD;  Location: [facility] INVASIVE CV LAB;  Service: Cardiovascular;  Laterality: Right;   SMALL INTESTINE SURGERY  1955    No Known Allergies   Physical Exam: General: The patient is alert and oriented x3 in no acute distress.  Dermatology: Skin is warm, dry and supple bilateral lower extremities.  Normal calluses noted bilateral feet with slight tenderness  Vascular: Palpable pedal pulses bilaterally. Capillary refill within normal limits.  No appreciable edema.  No erythema.  Neurological: Grossly intact via light touch  Musculoskeletal Exam: No pedal deformities noted.  Today there is some slight tenderness with palpation to the second MTP of the bilateral foot  Radiographic Exam B/L feet 07/12/2023:  Normal osseous mineralization. Joint spaces preserved.  No fractures or osseous irregularities noted.  Elongated second metatarsals noted bilateral  Assessment/Plan of Care: 1.  Symptomatic calluses  secondary to barefoot 2.  Second MTP capsulitis/metatarsalgia secondary to barefoot  -Patient evaluated.  X-rays reviewed -Patient admits to walking barefoot even outside when doing yard work.  I do believe this is likely when he gets the exacerbation of pain especially to the second toe bilateral. -Refrain from going barefoot.  Recommend good supportive tennis shoes and sneakers.  This should help with his calluses as well as pain to the forefoot -Return to clinic as needed       Dot Gazella, DPM [facility]  Dr. Dot Gazella, DPM    [address] [place], [place] [address]                Office [phone]  Fax [phone]

## 2023-08-02 ENCOUNTER — Encounter (INDEPENDENT_AMBULATORY_CARE_PROVIDER_SITE_OTHER): Payer: Self-pay

## 2023-09-19 DIAGNOSIS — H40003 Preglaucoma, unspecified, bilateral: Secondary | ICD-10-CM | POA: Diagnosis not present

## 2023-09-26 DIAGNOSIS — E113293 Type 2 diabetes mellitus with mild nonproliferative diabetic retinopathy without macular edema, bilateral: Secondary | ICD-10-CM | POA: Diagnosis not present

## 2023-09-26 DIAGNOSIS — H0259 Other disorders affecting eyelid function: Secondary | ICD-10-CM | POA: Diagnosis not present

## 2023-09-26 DIAGNOSIS — Z961 Presence of intraocular lens: Secondary | ICD-10-CM | POA: Diagnosis not present

## 2023-09-26 DIAGNOSIS — H40003 Preglaucoma, unspecified, bilateral: Secondary | ICD-10-CM | POA: Diagnosis not present

## 2023-09-26 LAB — HM DIABETES EYE EXAM

## 2023-10-13 ENCOUNTER — Encounter: Payer: Self-pay | Admitting: Podiatry

## 2023-10-13 ENCOUNTER — Ambulatory Visit (INDEPENDENT_AMBULATORY_CARE_PROVIDER_SITE_OTHER): Admitting: Podiatry

## 2023-10-13 DIAGNOSIS — B351 Tinea unguium: Secondary | ICD-10-CM | POA: Diagnosis not present

## 2023-10-13 DIAGNOSIS — L84 Corns and callosities: Secondary | ICD-10-CM

## 2023-10-13 DIAGNOSIS — M79674 Pain in right toe(s): Secondary | ICD-10-CM | POA: Diagnosis not present

## 2023-10-13 DIAGNOSIS — M2142 Flat foot [pes planus] (acquired), left foot: Secondary | ICD-10-CM

## 2023-10-13 DIAGNOSIS — M2141 Flat foot [pes planus] (acquired), right foot: Secondary | ICD-10-CM

## 2023-10-13 DIAGNOSIS — M79675 Pain in left toe(s): Secondary | ICD-10-CM

## 2023-10-13 DIAGNOSIS — E1165 Type 2 diabetes mellitus with hyperglycemia: Secondary | ICD-10-CM

## 2023-10-13 NOTE — Progress Notes (Signed)
 Subjective:  Patient ID: Ronald Malone, male    DOB: January 09, 1956,  MRN: [id number]  68 y.o. male presents to clinic with  at risk foot care with history of diabetic neuropathy and painful thick toenails that are difficult to trim. Pain interferes with ambulation. Aggravating factors include wearing enclosed shoe gear. Pain is relieved with periodic professional debridement.  Chief Complaint  Patient presents with   [facility]    Rm4 Diabetic foot care/ Dr. Gasper last visit February 2025/ A1C 8 bloodsugar 109   New problem(s): None   PCP is Gasper Nancyann BRAVO, MD.  No Known Allergies  Review of Systems: Negative except as noted in the HPI.   Objective:  Ronald Malone is a pleasant 68 y.o. male morbidly obese in NAD. AAO x 3.  Vascular Examination: Vascular status intact b/l with palpable pedal pulses. Pedal hair diminished b/l.  CFT immediate b/l. No edema. No pain with calf compression b/l. Skin temperature gradient WNL b/l. Nonpitting edema noted BLE.  Neurological Examination: Sensation grossly intact b/l with 10 gram monofilament.   Dermatological Examination: Pedal skin with normal turgor, texture and tone b/l. Toenails 1-5 b/l thick, discolored, elongated with subungual debris and pain on dorsal palpation. Preulcerative lesion noted medial IPJ of right great toe. There is visible subdermal hemorrhage. There is no surrounding erythema, no edema, no drainage, no odor, no fluctuance.  Musculoskeletal Examination: Muscle strength 5/5 to b/l LE. Muscle strength 5/5 to all lower extremity muscle groups bilaterally. No pain, crepitus or joint limitation noted with ROM bilateral LE. Pes planus deformity noted bilateral LE.  Radiographs: None  Last A1c:      Latest Ref Rng & Units 05/10/2023   11:20 AM 12/31/2022   10:25 AM  Hemoglobin A1C  Hemoglobin-A1c 4.0 - 5.6 % 8.7  7.2     Assessment:   1. Pain due to onychomycosis of toenails of both feet   2. Pre-ulcerative calluses    3. Type 2 diabetes mellitus with hyperglycemia, without long-term current use of insulin (HCC)    Plan:   Orders Placed This Encounter  Procedures   For home use only DME Other see comment    To [facility]: Dispense one pair extra depth shoes and 3 pair custom insoles.    Length of Need:   6 Months    -Consent given for treatment as described below: -Examined patient. -Rx to be sent to [facility] for one pair diabetic shoes and 3 pair custom insoles. To [facility] [address] [place], [place] [address] Phone: [phone] Fax: [phone]. -Toenails 1-5 b/l were debrided in length and girth with sterile nail nippers and dremel without iatrogenic bleeding.  -Preulcerative lesion pared medial IPJ of right great toe utilizing sterile scalpel blade. Total number pared=1. -Patient/POA to call should there be question/concern in the interim.  Return in about 3 months (around 01/13/2024).  Ronald Malone, DPM      [place] LOCATION: [address] [place] [address]                   Office [phone]   [place] LOCATION: [address] [place],  [place] [address] Office [phone]

## 2023-10-15 ENCOUNTER — Encounter: Payer: Self-pay | Admitting: Podiatry

## 2023-10-15 DIAGNOSIS — Z8631 Personal history of diabetic foot ulcer: Secondary | ICD-10-CM | POA: Insufficient documentation

## 2023-10-20 ENCOUNTER — Telehealth: Payer: Self-pay | Admitting: Podiatry

## 2023-10-20 NOTE — Telephone Encounter (Signed)
 Patient called stating he called [facility] and they state they have no RX for diabetic shoes for him.

## 2023-10-24 ENCOUNTER — Telehealth: Payer: Self-pay | Admitting: Family Medicine

## 2023-10-24 NOTE — Telephone Encounter (Signed)
 Received request for diabetic shoes, but patient is overdue for office visit and we need to document medical necessity for diabetic shoes. He needs to schedule appointment this month.

## 2023-10-27 ENCOUNTER — Other Ambulatory Visit (INDEPENDENT_AMBULATORY_CARE_PROVIDER_SITE_OTHER): Payer: Self-pay | Admitting: Nurse Practitioner

## 2023-10-27 DIAGNOSIS — I739 Peripheral vascular disease, unspecified: Secondary | ICD-10-CM

## 2023-10-28 ENCOUNTER — Ambulatory Visit (INDEPENDENT_AMBULATORY_CARE_PROVIDER_SITE_OTHER): Payer: Medicare Other | Admitting: Vascular Surgery

## 2023-10-28 ENCOUNTER — Ambulatory Visit (INDEPENDENT_AMBULATORY_CARE_PROVIDER_SITE_OTHER): Payer: Medicare Other

## 2023-10-28 ENCOUNTER — Encounter (INDEPENDENT_AMBULATORY_CARE_PROVIDER_SITE_OTHER): Payer: Self-pay | Admitting: Vascular Surgery

## 2023-10-28 VITALS — BP 142/84 | HR 59 | Ht 72.0 in | Wt 315.0 lb

## 2023-10-28 DIAGNOSIS — E1169 Type 2 diabetes mellitus with other specified complication: Secondary | ICD-10-CM | POA: Diagnosis not present

## 2023-10-28 DIAGNOSIS — I739 Peripheral vascular disease, unspecified: Secondary | ICD-10-CM

## 2023-10-28 DIAGNOSIS — M79605 Pain in left leg: Secondary | ICD-10-CM

## 2023-10-28 DIAGNOSIS — E1165 Type 2 diabetes mellitus with hyperglycemia: Secondary | ICD-10-CM | POA: Diagnosis not present

## 2023-10-28 DIAGNOSIS — M79604 Pain in right leg: Secondary | ICD-10-CM

## 2023-10-28 DIAGNOSIS — I82431 Acute embolism and thrombosis of right popliteal vein: Secondary | ICD-10-CM | POA: Diagnosis not present

## 2023-10-28 DIAGNOSIS — E785 Hyperlipidemia, unspecified: Secondary | ICD-10-CM

## 2023-10-28 NOTE — Assessment & Plan Note (Signed)
 Some degree of postphlebitic symptoms may be causing discoloration and swelling in the legs but that does not typically cause claudication type symptoms.  He remains on anticoagulation.

## 2023-10-28 NOTE — Assessment & Plan Note (Signed)
 ABI has had a slight drop down to 0.81 on the right but his waveforms remain biphasic and his digit pressure remains in the normal range.  Left ABI remains normal at 1.05.  No role for intervention at this time.  I think most of his claudication symptoms are likely neurogenic in nature.  I have offered a referral for spine specialist to evaluate but he has declined at this time.  Recheck in 2 years.

## 2023-10-28 NOTE — Progress Notes (Signed)
 MRN : [id number]  Ronald Malone is a 68 y.o. (May 15, 1955) male who presents with chief complaint of  Chief Complaint  Patient presents with   Follow-up    2 year follow up   .  History of Present Illness: Patient returns today in follow up of leg pain and swelling with PAD and previous DVT.  He continues to have claudication symptoms in his calf, hip, and buttock area.  He denies any ulceration or infection.  Swelling is intermittent and fairly mild.  Discolorations about the same.  He remains on anticoagulation after his previous unprovoked DVT.  He is on a prophylactic dose of Eliquis . ABI has had a slight drop down to 0.81 on the right but his waveforms remain biphasic and his digit pressure remains in the normal range.  Left ABI remains normal at 1.05.   Current Outpatient Medications  Medication Sig Dispense Refill   acetaminophen  (TYLENOL ) 650 MG CR tablet Take by mouth.     apixaban  (ELIQUIS ) 2.5 MG TABS tablet TAKE 1 TABLET BY MOUTH TWICE A DAY 180 tablet 2   atorvastatin (LIPITOR) 20 MG tablet Take 20 mg by mouth daily.     azelastine (ASTELIN) 0.1 % nasal spray Place into both nostrils 2 (two) times daily. Use in each nostril as directed     Blood Glucose Monitoring Suppl (ACCU-CHEK GUIDE) w/Device KIT Use to check blood sugar daily for type 2 diabetes E11.9 1 kit 0   COMBIVENT RESPIMAT 20-100 MCG/ACT AERS respimat Inhale 1 puff into the lungs every 6 (six) hours as needed.     glucose blood (ACCU-CHEK GUIDE TEST) test strip Use as instructed to check blood sugar daily for type 2 diabetes E11.9 100 each 12   losartan (COZAAR) 50 MG tablet Take 1 tablet by mouth daily.     metFORMIN  (GLUCOPHAGE -XR) 500 MG 24 hr tablet TAKE 2 TABLETS BY MOUTH TWICE A DAY 360 tablet 4   metoprolol succinate (TOPROL-XL) 50 MG 24 hr tablet Take 1 tablet by mouth daily.     nitroGLYCERIN  (NITROSTAT ) 0.4 MG SL tablet Place 1 tablet (0.4 mg total) under the tongue every 5 (five) minutes as needed for  chest pain. 100 tablet 1   latanoprost (XALATAN) 0.005 % ophthalmic solution Place 1 drop into both eyes at bedtime.     No current facility-administered medications for this visit.    Past Medical History:  Diagnosis Date   Cataract 9/22   CHF (congestive heart failure) (HCC)    Clotting disorder (HCC)    COPD (chronic obstructive pulmonary disease) (HCC)    Diabetes mellitus without complication (HCC)    Hypertension    MI (myocardial infarction) (HCC) 2015   Peripheral neuropathy    Pulmonary embolism (HCC) 2020   Sleep apnea    CPAP   Torn meniscus    right   Wears dentures    full upper, partial lower   Wears hearing aid in right ear     Past Surgical History:  Procedure Laterality Date   CATARACT EXTRACTION W/PHACO Right 01/13/2022   Procedure: CATARACT EXTRACTION PHACO AND INTRAOCULAR LENS PLACEMENT (IOC) RIGHT 9.56 01:34.8;  Surgeon: Mittie Gaskin, MD;  Location: [facility];  Service: Ophthalmology;  Laterality: Right;  Diabetic   CATARACT EXTRACTION W/PHACO Left 01/27/2022   Procedure: CATARACT EXTRACTION PHACO AND INTRAOCULAR LENS PLACEMENT (IOC) LEFT 3.42  00:52.2;  Surgeon: Mittie Gaskin, MD;  Location: [facility];  Service: Ophthalmology;  Laterality: Left;  Diabetic  COLON SURGERY     1/3 colon removed as an infant   COLONOSCOPY WITH PROPOFOL  N/A 12/26/2018   Procedure: COLONOSCOPY WITH PROPOFOL ;  Surgeon: Jinny Carmine, MD;  Location: [facility];  Service: Endoscopy;  Laterality: N/A;   COLONOSCOPY WITH PROPOFOL  N/A 04/01/2020   Procedure: COLONOSCOPY WITH PROPOFOL ;  Surgeon: Jinny Carmine, MD;  Location: [facility] END[facility];  Service: Endoscopy;  Laterality: N/A;   CORONARY ANGIOPLASTY WITH STENT PLACEMENT  2015   DES STENT POST MI, [place]    LOWER EXTREMITY ANGIOGRAPHY Right 10/23/2018   Procedure: LOWER EXTREMITY ANGIOGRAPHY;  Surgeon: Marea Selinda RAMAN, MD;  Location: [facility] INVAS[facility] CV LAB;  Service: Cardiovascular;   Laterality: Right;   SMALL INTESTINE SURGERY  1955     Social History   Tobacco Use   Smoking status: Every Day    Current packs/day: 2.00    Average packs/day: 2.0 packs/day for 47.6 years (95.2 ttl pk-yrs)    Types: Cigarettes    Start date: 1978   Smokeless tobacco: Never  Vaping Use   Vaping status: Never Used  Substance Use Topics   Alcohol use: Yes    Alcohol/week: 2.0 standard drinks of alcohol    Types: 2 Cans of beer per week    Comment: once a month   Drug use: Yes    Types: Marijuana    Comment: occasional      Family History  Problem Relation Age of Onset   Parkinson's disease Mother    Dementia Father      No Known Allergies    REVIEW OF SYSTEMS (Negative unless checked)   Constitutional: [] Weight loss  [] Fever  [] Chills Cardiac: [] Chest pain   [] Chest pressure   [] Palpitations   [] Shortness of breath when laying flat   [] Shortness of breath at rest   [] Shortness of breath with exertion. Vascular:  [] Pain in legs with walking   [] Pain in legs at rest   [] Pain in legs when laying flat   [] Claudication   [] Pain in feet when walking  [] Pain in feet at rest  [] Pain in feet when laying flat   [x] History of DVT   [x] Phlebitis   [x] Swelling in legs   [] Varicose veins   [] Non-healing ulcers Pulmonary:   [] Uses home oxygen   [] Productive cough   [] Hemoptysis   [] Wheeze  [] COPD   [] Asthma Neurologic:  [] Dizziness  [] Blackouts   [] Seizures   [] History of stroke   [] History of TIA  [] Aphasia   [] Temporary blindness   [] Dysphagia   [] Weakness or numbness in arms   [x] Weakness or numbness in legs Musculoskeletal:  [x] Arthritis   [] Joint swelling   [] Joint pain   [] Low back pain Hematologic:  [] Easy bruising  [] Easy bleeding   [] Hypercoagulable state   [] Anemic  [] Hepatitis Gastrointestinal:  [] Blood in stool   [] Vomiting blood  [] Gastroesophageal reflux/heartburn   [] Abdominal pain Genitourinary:  [] Chronic kidney disease   [] Difficult urination  [] Frequent urination   [] Burning with urination   [] Hematuria Skin:  [] Rashes   [] Ulcers   [] Wounds Psychological:  [] History of anxiety   []  History of major depression.    Physical Examination  BP (!) 142/84   Pulse (!) 59   Ht 6' (1.829 m)   Wt (!) 315 lb (142.9 kg)   BMI 42.72 kg/m  Gen:  WD/WN, NAD Head: [place]/AT, No temporalis wasting. Ear/Nose/Throat: Hearing grossly intact, nares w/o erythema or drainage Eyes: Conjunctiva clear. Sclera non-icteric Neck: Supple.  Trachea midline Pulmonary:  Good air movement,  no use of accessory muscles.  Cardiac: bradycardic Vascular:  Vessel Right Left  Radial Palpable Palpable                          PT Palpable Palpable  DP Palpable Palpable   Gastrointestinal: soft, non-tender/non-distended. No guarding/reflex.  Musculoskeletal: M/S 5/5 throughout.  No deformity or atrophy.  Moderate stasis dermatitis changes present bilaterally.  Mild bilateral lower extremity edema. Neurologic: Sensation grossly intact in extremities.  Symmetrical.  Speech is fluent.  Psychiatric: Judgment intact, Mood & affect appropriate for pt's clinical situation. Dermatologic: No rashes or ulcers noted.  No cellulitis or open wounds.      Labs Recent Results (from the past 2160 hours)  HM DIABETES EYE EXAM     Status: Abnormal   Collection Time: 09/26/23  9:10 AM  Result Value Ref Range   HM Diabetic Eye Exam Retinopathy (A) No Retinopathy    Comment: Abstracted by HIM    Radiology No results found.  Assessment/Plan  Peripheral artery disease (HCC) ABI has had a slight drop down to 0.81 on the right but his waveforms remain biphasic and his digit pressure remains in the normal range.  Left ABI remains normal at 1.05.  No role for intervention at this time.  I think most of his claudication symptoms are likely neurogenic in nature.  I have offered a referral for spine specialist to evaluate but he has declined at this time.  Recheck in 2 years.  DVT (deep venous  thrombosis) (HCC) Some degree of postphlebitic symptoms may be causing discoloration and swelling in the legs but that does not typically cause claudication type symptoms.  He remains on anticoagulation.  Tobacco use disorder Definitely increases his clotting risk and cessation would be of benefit.   Diabetes mellitus without complication (HCC) blood glucose control important in reducing the progression of atherosclerotic disease. Also, involved in wound healing. On appropriate medications.   Hyperlipidemia associated with type 2 diabetes mellitus (HCC) lipid control important in reducing the progression of atherosclerotic disease. Continue statin therapy  Selinda Gu, MD  10/28/2023 9:44 AM    This note was created with Dragon medical transcription system.  Any errors from dictation are purely unintentional

## 2023-10-31 ENCOUNTER — Ambulatory Visit (INDEPENDENT_AMBULATORY_CARE_PROVIDER_SITE_OTHER): Admitting: Family Medicine

## 2023-10-31 ENCOUNTER — Encounter: Payer: Self-pay | Admitting: Family Medicine

## 2023-10-31 VITALS — BP 145/73 | HR 62 | Resp 20 | Ht 72.0 in | Wt 320.1 lb

## 2023-10-31 DIAGNOSIS — R079 Chest pain, unspecified: Secondary | ICD-10-CM | POA: Diagnosis not present

## 2023-10-31 DIAGNOSIS — G2581 Restless legs syndrome: Secondary | ICD-10-CM | POA: Diagnosis not present

## 2023-10-31 DIAGNOSIS — E1165 Type 2 diabetes mellitus with hyperglycemia: Secondary | ICD-10-CM | POA: Diagnosis not present

## 2023-10-31 DIAGNOSIS — Z86711 Personal history of pulmonary embolism: Secondary | ICD-10-CM

## 2023-10-31 DIAGNOSIS — I25118 Atherosclerotic heart disease of native coronary artery with other forms of angina pectoris: Secondary | ICD-10-CM | POA: Diagnosis not present

## 2023-10-31 DIAGNOSIS — J439 Emphysema, unspecified: Secondary | ICD-10-CM

## 2023-10-31 DIAGNOSIS — I5022 Chronic systolic (congestive) heart failure: Secondary | ICD-10-CM | POA: Diagnosis not present

## 2023-10-31 DIAGNOSIS — E1140 Type 2 diabetes mellitus with diabetic neuropathy, unspecified: Secondary | ICD-10-CM

## 2023-10-31 LAB — VAS US ABI WITH/WO TBI
Left ABI: 1.05
Right ABI: 0.81

## 2023-10-31 MED ORDER — NITROGLYCERIN 0.4 MG SL SUBL: 0.4000 mg | SUBLINGUAL_TABLET | SUBLINGUAL | 1 refills | Status: DC | PRN

## 2023-10-31 NOTE — Patient Instructions (Signed)
 SABRA  Please review the attached list of medications and notify my office if there are any errors.   . Please bring all of your medications to every appointment so we can make sure that our medication list is the same as yours.

## 2023-10-31 NOTE — Progress Notes (Signed)
 Established patient visit   Patient: Ronald Malone   DOB: May 28, 1955   68 y.o. Male  MRN: [id number] Visit Date: 10/31/2023  Today's healthcare provider: Nancyann Perry, MD   Chief Complaint  Patient presents with   Diabetes    Diabetic shoe   Hyperlipidemia   Hypertension   Subjective    Discussed the use of AI scribe software for clinical note transcription with the patient, who gave verbal consent to proceed.  History of Present Illness   Ronald Malone is a 68 year old male with diabetes and hypertension who presents for a follow-up visit to monitor his blood sugar and blood pressure.  He has been monitoring his blood sugar since April, initially around 200 mg/dL, with recent improvement to approximately 130 mg/dL. His last A1c was 8.7%. He has reduced his intake of sweets but has not significantly changed his diet or exercise routine. He is interested in checking his A1c today.  He had some issues with restless legs at his las visit but reports this has improved. Previously, gabapentin  was ineffective, but Tylenol  500 mg nightly provided relief. He stopped taking Tylenol  a couple of months ago and is now sleeping well, reporting 10 hours of sleep last night.  He experiences neuropathy with numbness and tingling in his feet. He recently visited a foot doctor for wellness visits covered by his insurance and was informed about eligibility for diabetic shoes. He notes decreased pressures in his right leg compared to two years ago. He has a history of blood clots five years ago, leading to his current use of Eliquis .  He has emphysema per LDCT report and has been smoking for fifty years. He uses a CPAP machine, which he believes helps his breathing.  He is currently on Eliquis  and has encountered issues with prescription refills, receiving only a month's supply at a time instead of three months.     Lab Results  Component Value Date   HGBA1C 8.7 (A) 05/10/2023    HGBA1C 7.2 (A) 12/31/2022   HGBA1C 8.8 (H) 09/29/2022     Medications: Outpatient Medications Prior to Visit  Medication Sig   apixaban  (ELIQUIS ) 2.5 MG TABS tablet TAKE 1 TABLET BY MOUTH TWICE A DAY   atorvastatin (LIPITOR) 20 MG tablet Take 20 mg by mouth daily.   azelastine (ASTELIN) 0.1 % nasal spray Place into both nostrils 2 (two) times daily. Use in each nostril as directed   Blood Glucose Monitoring Suppl (ACCU-CHEK GUIDE) w/Device KIT Use to check blood sugar daily for type 2 diabetes E11.9   COMBIVENT RESPIMAT 20-100 MCG/ACT AERS respimat Inhale 1 puff into the lungs every 6 (six) hours as needed.   glucose blood (ACCU-CHEK GUIDE TEST) test strip Use as instructed to check blood sugar daily for type 2 diabetes E11.9   losartan (COZAAR) 50 MG tablet Take 1 tablet by mouth daily.   metFORMIN  (GLUCOPHAGE -XR) 500 MG 24 hr tablet TAKE 2 TABLETS BY MOUTH TWICE A DAY   metoprolol succinate (TOPROL-XL) 50 MG 24 hr tablet Take 1 tablet by mouth daily.   [DISCONTINUED] acetaminophen  (TYLENOL ) 650 MG CR tablet Take by mouth.   [DISCONTINUED] nitroGLYCERIN  (NITROSTAT ) 0.4 MG SL tablet Place 1 tablet (0.4 mg total) under the tongue every 5 (five) minutes as needed for chest pain.   [DISCONTINUED] latanoprost (XALATAN) 0.005 % ophthalmic solution Place 1 drop into both eyes at bedtime.   No facility-administered medications prior to visit.   Review of  Systems     Objective    BP (!) 145/73 (BP Location: Right Arm, Patient Position: Sitting, Cuff Size: Large)   Pulse 62   Resp 20   Ht 6' (1.829 m)   Wt (!) 320 lb 1.6 oz (145.2 kg)   SpO2 96%   BMI 43.41 kg/m   Physical Exam   General: Appearance:    Severely obese male in no acute distress  Eyes:    PERRL, conjunctiva/corneas clear, EOM's intact       Lungs:     Clear to auscultation bilaterally, respirations unlabored  Heart:    Normal heart rate. Normal rhythm. No murmurs, rubs, or gallops.    MS:   All extremities are intact.     Neurologic:   Awake, alert, oriented x 3. No apparent focal neurological defect.   Ext:   Several pre-ulcerative calluses on toes of feet. No open wounds or erythema.      Assessment & Plan    1. Type 2 diabetes mellitus with neuropathy without long-term current use of insulin (HCC) (Primary) Tolerating current medications, but A1c has not been near goal.   - Comprehensive metabolic panel with GFR - Lipid panel - Hemoglobin A1c - CBC  Pre-ulcerative callus noted on great toes. Needs diabetic shoes which he would like to get through [facility].  Anticipated starting Ozempic  after reviewing labs.   2. CAD with stable angina Currently symptomatic. Continue aggressive risk factor modification. Refill  nitroGLYCERIN  (NITROSTAT ) 0.4 MG SL tablet; Place 1 tablet (0.4 mg total) under the tongue every 5 (five) minutes as needed for chest pain.  Dispense: 100 tablet; Refill: 1  3. Restless leg syndrome Has mostly resolved since last visit.  - Vitamin B12  4. Heart failure with mildly reduced ejection fraction (HCC) Asymptomatic. Compliant with medication.  Continue aggressive risk factor modification.    5. Morbid obesity (HCC) He has not been able to lose weight through diet and exercise. Anticipate starting semaglutide  for better glycemic control which should help lose weight.   6. History of pulmonary embolism Continue current does of Eliquis . Needs to change back to 90 day refills.   7. Pulmonary emphysema, unspecified emphysema type (HCC) Asymptomatic. Encourage smoking cessation.        Nancyann Perry, MD  [facility] [phone] (phone) [phone] (fax)  [facility]

## 2023-11-01 ENCOUNTER — Other Ambulatory Visit: Payer: Self-pay | Admitting: Family Medicine

## 2023-11-01 ENCOUNTER — Ambulatory Visit: Payer: Self-pay | Admitting: Family Medicine

## 2023-11-01 DIAGNOSIS — E1165 Type 2 diabetes mellitus with hyperglycemia: Secondary | ICD-10-CM

## 2023-11-01 LAB — COMPREHENSIVE METABOLIC PANEL WITH GFR
ALT: 23 IU/L (ref 0–44)
AST: 18 IU/L (ref 0–40)
Albumin: 4.3 g/dL (ref 3.9–4.9)
Alkaline Phosphatase: 83 IU/L (ref 44–121)
BUN/Creatinine Ratio: 13 (ref 10–24)
BUN: 12 mg/dL (ref 8–27)
Bilirubin Total: 0.4 mg/dL (ref 0.0–1.2)
CO2: 19 mmol/L — ABNORMAL LOW (ref 20–29)
Calcium: 9 mg/dL (ref 8.6–10.2)
Chloride: 102 mmol/L (ref 96–106)
Creatinine, Ser: 0.96 mg/dL (ref 0.76–1.27)
Globulin, Total: 2.4 g/dL (ref 1.5–4.5)
Glucose: 174 mg/dL — ABNORMAL HIGH (ref 70–99)
Potassium: 4.7 mmol/L (ref 3.5–5.2)
Sodium: 137 mmol/L (ref 134–144)
Total Protein: 6.7 g/dL (ref 6.0–8.5)
eGFR: 85 mL/min/1.73 (ref >59)

## 2023-11-01 LAB — LIPID PANEL
Chol/HDL Ratio: 3.8 ratio (ref 0.0–5.0)
Cholesterol, Total: 124 mg/dL (ref 100–199)
HDL: 33 mg/dL — ABNORMAL LOW (ref >39)
LDL Chol Calc (NIH): 63 mg/dL (ref 0–99)
Triglycerides: 162 mg/dL — ABNORMAL HIGH (ref 0–149)
VLDL Cholesterol Cal: 28 mg/dL (ref 5–40)

## 2023-11-01 LAB — CBC
Hematocrit: 43.5 % (ref 37.5–51.0)
Hemoglobin: 14.6 g/dL (ref 13.0–17.7)
MCH: 32.2 pg (ref 26.6–33.0)
MCHC: 33.6 g/dL (ref 31.5–35.7)
MCV: 96 fL (ref 79–97)
Platelets: 224 x10E3/uL (ref 150–450)
RBC: 4.53 x10E6/uL (ref 4.14–5.80)
RDW: 12.3 % (ref 11.6–15.4)
WBC: 7.9 x10E3/uL (ref 3.4–10.8)

## 2023-11-01 LAB — HEMOGLOBIN A1C
Est. average glucose Bld gHb Est-mCnc: 163 mg/dL
Hgb A1c MFr Bld: 7.3 % — ABNORMAL HIGH (ref 4.8–5.6)

## 2023-11-01 LAB — VITAMIN B12: Vitamin B-12: 408 pg/mL (ref 232–1245)

## 2023-11-01 MED ORDER — OZEMPIC (0.25 OR 0.5 MG/DOSE) 2 MG/3ML ~~LOC~~ SOPN: PEN_INJECTOR | SUBCUTANEOUS | 5 refills | Status: AC

## 2023-11-10 DIAGNOSIS — G4733 Obstructive sleep apnea (adult) (pediatric): Secondary | ICD-10-CM | POA: Diagnosis not present

## 2023-11-11 ENCOUNTER — Other Ambulatory Visit: Payer: Self-pay | Admitting: Family Medicine

## 2023-11-11 DIAGNOSIS — I82431 Acute embolism and thrombosis of right popliteal vein: Secondary | ICD-10-CM

## 2023-11-11 MED ORDER — APIXABAN 2.5 MG PO TABS: 2.5000 mg | ORAL_TABLET | Freq: Two times a day (BID) | ORAL | 4 refills | Status: AC

## 2023-11-21 DIAGNOSIS — R0609 Other forms of dyspnea: Secondary | ICD-10-CM | POA: Diagnosis not present

## 2023-11-21 DIAGNOSIS — F17210 Nicotine dependence, cigarettes, uncomplicated: Secondary | ICD-10-CM | POA: Diagnosis not present

## 2023-11-21 DIAGNOSIS — G4733 Obstructive sleep apnea (adult) (pediatric): Secondary | ICD-10-CM | POA: Diagnosis not present

## 2023-11-21 DIAGNOSIS — J449 Chronic obstructive pulmonary disease, unspecified: Secondary | ICD-10-CM | POA: Diagnosis not present

## 2023-11-22 ENCOUNTER — Other Ambulatory Visit: Payer: Self-pay | Admitting: Specialist

## 2023-11-22 DIAGNOSIS — F17210 Nicotine dependence, cigarettes, uncomplicated: Secondary | ICD-10-CM

## 2023-11-22 DIAGNOSIS — J449 Chronic obstructive pulmonary disease, unspecified: Secondary | ICD-10-CM

## 2023-11-25 DIAGNOSIS — Z955 Presence of coronary angioplasty implant and graft: Secondary | ICD-10-CM | POA: Diagnosis not present

## 2023-11-25 DIAGNOSIS — G4733 Obstructive sleep apnea (adult) (pediatric): Secondary | ICD-10-CM | POA: Diagnosis not present

## 2023-11-25 DIAGNOSIS — I739 Peripheral vascular disease, unspecified: Secondary | ICD-10-CM | POA: Diagnosis not present

## 2023-11-25 DIAGNOSIS — E119 Type 2 diabetes mellitus without complications: Secondary | ICD-10-CM | POA: Diagnosis not present

## 2023-11-25 DIAGNOSIS — R5383 Other fatigue: Secondary | ICD-10-CM | POA: Diagnosis not present

## 2023-11-25 DIAGNOSIS — Z86718 Personal history of other venous thrombosis and embolism: Secondary | ICD-10-CM | POA: Diagnosis not present

## 2023-11-25 DIAGNOSIS — E782 Mixed hyperlipidemia: Secondary | ICD-10-CM | POA: Diagnosis not present

## 2023-11-25 DIAGNOSIS — I219 Acute myocardial infarction, unspecified: Secondary | ICD-10-CM | POA: Diagnosis not present

## 2023-11-25 DIAGNOSIS — R0602 Shortness of breath: Secondary | ICD-10-CM | POA: Diagnosis not present

## 2023-11-25 DIAGNOSIS — Z87891 Personal history of nicotine dependence: Secondary | ICD-10-CM | POA: Diagnosis not present

## 2023-11-25 DIAGNOSIS — I25118 Atherosclerotic heart disease of native coronary artery with other forms of angina pectoris: Secondary | ICD-10-CM | POA: Diagnosis not present

## 2023-11-29 ENCOUNTER — Encounter
Admission: EM | Disposition: A | Discharge status: Other Acute Inpatient Hospital | Payer: Self-pay | Source: Home / Self Care | Attending: Hospitalist

## 2023-11-29 ENCOUNTER — Emergency Department

## 2023-11-29 ENCOUNTER — Inpatient Hospital Stay
Admission: EM | Admit: 2023-11-29 | Discharge: 2023-11-30 | DRG: 281 | Disposition: A | Discharge status: Other Acute Inpatient Hospital | Attending: Internal Medicine | Admitting: Internal Medicine

## 2023-11-29 ENCOUNTER — Inpatient Hospital Stay: Admit: 2023-11-29 | Discharge: 2023-11-29 | Disposition: A | Discharge status: Home / Self Care

## 2023-11-29 ENCOUNTER — Other Ambulatory Visit: Payer: Self-pay

## 2023-11-29 DIAGNOSIS — Z9861 Coronary angioplasty status: Secondary | ICD-10-CM | POA: Diagnosis not present

## 2023-11-29 DIAGNOSIS — R7989 Other specified abnormal findings of blood chemistry: Secondary | ICD-10-CM | POA: Diagnosis not present

## 2023-11-29 DIAGNOSIS — Z974 Presence of external hearing-aid: Secondary | ICD-10-CM

## 2023-11-29 DIAGNOSIS — Z7901 Long term (current) use of anticoagulants: Secondary | ICD-10-CM | POA: Diagnosis not present

## 2023-11-29 DIAGNOSIS — Z955 Presence of coronary angioplasty implant and graft: Secondary | ICD-10-CM | POA: Diagnosis not present

## 2023-11-29 DIAGNOSIS — Z9981 Dependence on supplemental oxygen: Secondary | ICD-10-CM | POA: Diagnosis not present

## 2023-11-29 DIAGNOSIS — J449 Chronic obstructive pulmonary disease, unspecified: Secondary | ICD-10-CM | POA: Diagnosis not present

## 2023-11-29 DIAGNOSIS — Z6841 Body Mass Index (BMI) 40.0 and over, adult: Secondary | ICD-10-CM | POA: Diagnosis not present

## 2023-11-29 DIAGNOSIS — Z86711 Personal history of pulmonary embolism: Secondary | ICD-10-CM | POA: Diagnosis not present

## 2023-11-29 DIAGNOSIS — Z794 Long term (current) use of insulin: Secondary | ICD-10-CM | POA: Diagnosis not present

## 2023-11-29 DIAGNOSIS — I214 Non-ST elevation (NSTEMI) myocardial infarction: Secondary | ICD-10-CM | POA: Diagnosis not present

## 2023-11-29 DIAGNOSIS — E1165 Type 2 diabetes mellitus with hyperglycemia: Secondary | ICD-10-CM | POA: Diagnosis present

## 2023-11-29 DIAGNOSIS — I2510 Atherosclerotic heart disease of native coronary artery without angina pectoris: Secondary | ICD-10-CM | POA: Diagnosis not present

## 2023-11-29 DIAGNOSIS — I5022 Chronic systolic (congestive) heart failure: Secondary | ICD-10-CM | POA: Diagnosis not present

## 2023-11-29 DIAGNOSIS — E785 Hyperlipidemia, unspecified: Secondary | ICD-10-CM | POA: Diagnosis not present

## 2023-11-29 DIAGNOSIS — I2121 ST elevation (STEMI) myocardial infarction involving left circumflex coronary artery: Secondary | ICD-10-CM | POA: Diagnosis not present

## 2023-11-29 DIAGNOSIS — I110 Hypertensive heart disease with heart failure: Secondary | ICD-10-CM | POA: Diagnosis present

## 2023-11-29 DIAGNOSIS — Z87891 Personal history of nicotine dependence: Secondary | ICD-10-CM

## 2023-11-29 DIAGNOSIS — I2102 ST elevation (STEMI) myocardial infarction involving left anterior descending coronary artery: Secondary | ICD-10-CM | POA: Diagnosis not present

## 2023-11-29 DIAGNOSIS — Z604 Social exclusion and rejection: Secondary | ICD-10-CM | POA: Diagnosis present

## 2023-11-29 DIAGNOSIS — Z86718 Personal history of other venous thrombosis and embolism: Secondary | ICD-10-CM

## 2023-11-29 DIAGNOSIS — I255 Ischemic cardiomyopathy: Secondary | ICD-10-CM | POA: Diagnosis not present

## 2023-11-29 DIAGNOSIS — I5023 Acute on chronic systolic (congestive) heart failure: Secondary | ICD-10-CM | POA: Diagnosis not present

## 2023-11-29 DIAGNOSIS — R079 Chest pain, unspecified: Secondary | ICD-10-CM | POA: Diagnosis not present

## 2023-11-29 DIAGNOSIS — Z79899 Other long term (current) drug therapy: Secondary | ICD-10-CM | POA: Diagnosis not present

## 2023-11-29 DIAGNOSIS — Z7982 Long term (current) use of aspirin: Secondary | ICD-10-CM | POA: Diagnosis not present

## 2023-11-29 DIAGNOSIS — F17210 Nicotine dependence, cigarettes, uncomplicated: Secondary | ICD-10-CM | POA: Diagnosis not present

## 2023-11-29 DIAGNOSIS — I252 Old myocardial infarction: Secondary | ICD-10-CM

## 2023-11-29 DIAGNOSIS — Z820 Family history of epilepsy and other diseases of the nervous system: Secondary | ICD-10-CM

## 2023-11-29 DIAGNOSIS — I10 Essential (primary) hypertension: Secondary | ICD-10-CM | POA: Diagnosis not present

## 2023-11-29 DIAGNOSIS — G4733 Obstructive sleep apnea (adult) (pediatric): Secondary | ICD-10-CM | POA: Diagnosis present

## 2023-11-29 DIAGNOSIS — Z7984 Long term (current) use of oral hypoglycemic drugs: Secondary | ICD-10-CM | POA: Diagnosis not present

## 2023-11-29 DIAGNOSIS — R0789 Other chest pain: Secondary | ICD-10-CM | POA: Diagnosis not present

## 2023-11-29 LAB — BASIC METABOLIC PANEL WITH GFR
Anion gap: 11 (ref 5–15)
BUN: 14 mg/dL (ref 8–23)
CO2: 24 mmol/L (ref 22–32)
Calcium: 9.1 mg/dL (ref 8.9–10.3)
Chloride: 101 mmol/L (ref 98–111)
Creatinine, Ser: 0.91 mg/dL (ref 0.61–1.24)
GFR, Estimated: >60 mL/min (ref >60)
Glucose, Bld: 128 mg/dL — ABNORMAL HIGH (ref 70–99)
Potassium: 4.3 mmol/L (ref 3.5–5.1)
Sodium: 136 mmol/L (ref 135–145)

## 2023-11-29 LAB — HEPARIN LEVEL (UNFRACTIONATED)
Heparin Unfractionated: 0.46 [IU]/mL (ref 0.30–0.70)
Heparin Unfractionated: 0.4 [IU]/mL (ref 0.30–0.70)

## 2023-11-29 LAB — CBC
HCT: 46.1 % (ref 39.0–52.0)
Hemoglobin: 15 g/dL (ref 13.0–17.0)
MCH: 31.6 pg (ref 26.0–34.0)
MCHC: 32.5 g/dL (ref 30.0–36.0)
MCV: 97.3 fL (ref 80.0–100.0)
Platelets: 245 K/uL (ref 150–400)
RBC: 4.74 MIL/uL (ref 4.22–5.81)
RDW: 13.1 % (ref 11.5–15.5)
WBC: 10.8 K/uL — ABNORMAL HIGH (ref 4.0–10.5)
nRBC: 0 % (ref 0.0–0.2)

## 2023-11-29 LAB — TROPONIN I (HIGH SENSITIVITY)
Troponin I (High Sensitivity): 2953 ng/L (ref <18)
Troponin I (High Sensitivity): 2747 ng/L (ref <18)

## 2023-11-29 LAB — CBG MONITORING, ED: Glucose-Capillary: 124 mg/dL — ABNORMAL HIGH (ref 70–99)

## 2023-11-29 LAB — HIV ANTIBODY (ROUTINE TESTING W REFLEX): HIV Screen 4th Generation wRfx: NONREACTIVE

## 2023-11-29 LAB — PROTIME-INR
INR: 1.1 (ref 0.8–1.2)
Prothrombin Time: 14.7 s (ref 11.4–15.2)

## 2023-11-29 LAB — APTT: aPTT: 31 s (ref 24–36)

## 2023-11-29 LAB — GLUCOSE, CAPILLARY
Glucose-Capillary: 155 mg/dL — ABNORMAL HIGH (ref 70–99)
Glucose-Capillary: 202 mg/dL — ABNORMAL HIGH (ref 70–99)

## 2023-11-29 SURGERY — LEFT HEART CATH AND CORONARY ANGIOGRAPHY
Anesthesia: Moderate Sedation

## 2023-11-29 MED ORDER — ONDANSETRON HCL 4 MG/2ML IJ SOLN
4.0000 mg | Freq: Once | INTRAMUSCULAR | Status: AC
  Administered 2023-11-29: 4 mg via INTRAVENOUS
  Filled 2023-11-29: qty 2

## 2023-11-29 MED ORDER — METOPROLOL SUCCINATE ER 50 MG PO TB24
50.0000 mg | ORAL_TABLET | Freq: Every day | ORAL | Status: DC
Start: 2023-11-29 — End: 2023-12-01
  Administered 2023-11-29: 50 mg via ORAL
  Filled 2023-11-29 (×2): qty 1

## 2023-11-29 MED ORDER — NITROGLYCERIN 0.4 MG SL SUBL: 0.4000 mg | SUBLINGUAL_TABLET | SUBLINGUAL | Status: DC | PRN

## 2023-11-29 MED ORDER — LOSARTAN POTASSIUM 50 MG PO TABS
100.0000 mg | ORAL_TABLET | Freq: Every day | ORAL | Status: DC
  Administered 2023-11-29 – 2023-11-30 (×2): 100 mg via ORAL
  Filled 2023-11-29 (×2): qty 2

## 2023-11-29 MED ORDER — ATORVASTATIN CALCIUM 20 MG PO TABS
40.0000 mg | ORAL_TABLET | Freq: Every evening | ORAL | Status: DC
  Administered 2023-11-29 – 2023-11-30 (×2): 40 mg via ORAL
  Filled 2023-11-29 (×2): qty 2

## 2023-11-29 MED ORDER — ONDANSETRON HCL 4 MG PO TABS: 4.0000 mg | ORAL_TABLET | Freq: Four times a day (QID) | ORAL | Status: DC | PRN

## 2023-11-29 MED ORDER — HEPARIN BOLUS VIA INFUSION
2000.0000 [IU] | Freq: Once | INTRAVENOUS | Status: AC
  Administered 2023-11-29: 2000 [IU] via INTRAVENOUS
  Filled 2023-11-29: qty 2000

## 2023-11-29 MED ORDER — ACETAMINOPHEN 325 MG PO TABS: 650.0000 mg | ORAL_TABLET | Freq: Four times a day (QID) | ORAL | Status: DC | PRN

## 2023-11-29 MED ORDER — ATORVASTATIN CALCIUM 20 MG PO TABS
20.0000 mg | ORAL_TABLET | Freq: Every evening | ORAL | Status: DC
Start: 2023-11-29 — End: 2023-11-29

## 2023-11-29 MED ORDER — INSULIN ASPART 100 UNIT/ML IJ SOLN
0.0000 [IU] | Freq: Every day | INTRAMUSCULAR | Status: DC
  Administered 2023-11-29: 2 [IU] via SUBCUTANEOUS

## 2023-11-29 MED ORDER — ASPIRIN 81 MG PO CHEW
324.0000 mg | CHEWABLE_TABLET | Freq: Once | ORAL | Status: AC
  Administered 2023-11-29: 324 mg via ORAL
  Filled 2023-11-29: qty 4

## 2023-11-29 MED ORDER — MORPHINE SULFATE (PF) 2 MG/ML IV SOLN: 2.0000 mg | INTRAVENOUS | Status: DC | PRN

## 2023-11-29 MED ORDER — INSULIN ASPART 100 UNIT/ML IJ SOLN: 0.0000 [IU] | Freq: Three times a day (TID) | INTRAMUSCULAR | Status: DC

## 2023-11-29 MED ORDER — SPIRONOLACTONE 25 MG PO TABS
25.0000 mg | ORAL_TABLET | Freq: Every day | ORAL | Status: DC
Start: 2023-11-29 — End: 2023-11-29

## 2023-11-29 MED ORDER — MORPHINE SULFATE (PF) 4 MG/ML IV SOLN
4.0000 mg | Freq: Once | INTRAVENOUS | Status: AC
  Administered 2023-11-29: 4 mg via INTRAVENOUS
  Filled 2023-11-29: qty 1

## 2023-11-29 MED ORDER — HEPARIN (PORCINE) 25000 UT/250ML-% IV SOLN
1450.0000 [IU]/h | INTRAVENOUS | Status: DC
  Administered 2023-11-29 – 2023-11-30 (×2): 1450 [IU]/h via INTRAVENOUS
  Filled 2023-11-29 (×2): qty 250

## 2023-11-29 MED ORDER — ASPIRIN 81 MG PO CHEW
81.0000 mg | CHEWABLE_TABLET | ORAL | Status: AC
Start: 2023-11-30 — End: 2023-12-01
  Administered 2023-11-30: 81 mg via ORAL
  Filled 2023-11-29: qty 1

## 2023-11-29 MED ORDER — PERFLUTREN LIPID MICROSPHERE
1.0000 mL | INTRAVENOUS | Status: AC | PRN
  Administered 2023-11-29: 2 mL via INTRAVENOUS

## 2023-11-29 MED ORDER — ONDANSETRON HCL 4 MG/2ML IJ SOLN: 4.0000 mg | Freq: Four times a day (QID) | INTRAMUSCULAR | Status: DC | PRN

## 2023-11-29 MED ORDER — ACETAMINOPHEN 650 MG RE SUPP: 650.0000 mg | Freq: Four times a day (QID) | RECTAL | Status: DC | PRN

## 2023-11-29 MED ORDER — FREE WATER
500.0000 mL | Freq: Once | Status: AC
Start: 2023-11-30 — End: 2023-11-30
  Administered 2023-11-30: 500 mL via ORAL

## 2023-11-29 MED ORDER — ASPIRIN 81 MG PO TBEC: 81.0000 mg | DELAYED_RELEASE_TABLET | Freq: Every day | ORAL | Status: DC

## 2023-11-29 MED ORDER — POLYETHYLENE GLYCOL 3350 17 G PO PACK: 17.0000 g | PACK | Freq: Every day | ORAL | Status: DC | PRN

## 2023-11-29 NOTE — ED Notes (Signed)
 Call placed to central cardiac monitoring

## 2023-11-29 NOTE — ED Triage Notes (Signed)
 Pt to ED via POV from home. Pt reports centralized CP that started yesterday had has been constant. Non-radiating. Pain 7/10. Pt everyday smoker. Hx of MI 1yrs ago.

## 2023-11-29 NOTE — Consult Note (Signed)
 PHARMACY - ANTICOAGULATION CONSULT NOTE  Pharmacy Consult for heparin  infusion Indication: ACS/STEMI  No Known Allergies  Patient Measurements: Height: 6' (182.9 cm) Weight: (!) 145.2 kg (320 lb 1.7 oz) IBW/kg (Calculated) : 77.6 HEPARIN  DW (KG): 111.5  Vital Signs: Temp: 97.6 F (36.4 C) (09/16 0808) Temp Source: Oral (09/16 0808) BP: 143/80 (09/16 0726) Pulse Rate: 80 (09/16 0726)  Labs: Recent Labs    11/29/23 0730  HGB 15.0  HCT 46.1  PLT 245  CREATININE 0.91  TROPONINIHS 2,953*    Estimated Creatinine Clearance: 111.8 mL/min (by C-G formula based on SCr of 0.91 mg/dL).   Medical History: Past Medical History:  Diagnosis Date   Cataract 9/22   CHF (congestive heart failure) (HCC)    Clotting disorder (HCC)    COPD (chronic obstructive pulmonary disease) (HCC)    Diabetes mellitus without complication (HCC)    Hypertension    MI (myocardial infarction) (HCC) 2015   Peripheral neuropathy    Pulmonary embolism (HCC) 2020   Sleep apnea    CPAP   Torn meniscus    right   Wears dentures    full upper, partial lower   Wears hearing aid in right ear     Medications:  Patient takes Eliquis  2.5 mg po BID for Hx of DVT and PAD.  Last dose 11/28/23 @ 2200.  Assessment: 68 yo male presented to ED with chest pain that began 11/28/23.  PMH includes PAD, PE/DVT, CHF, COPD, HTN, CAD, and DM.  In ED patient found to have elevated troponin.  Pharmacy consulted for initiation of heparin  infusion.  Baseline HL, aPTT, and PT/INR ordered  Goal of Therapy:  Heparin  level 0.3-0.7 units/ml aPTT 66-102 seconds Monitor platelets by anticoagulation protocol: Yes   Plan:  Give heparin  2000 units IV x 1 (will give half bolus since patient on apixaban  PTA) Start heparin  infusion at 1450 units/hr Check aPTT 6 hours after heparin  infusion started and HL daily Adjust based on aPTT until correlation with heparin  level Daily CBC while on heparin   Kayla JULIANNA Blew, PharmD,  BCPS 11/29/2023,9:27 AM

## 2023-11-29 NOTE — ED Notes (Signed)
 See triage note Presents with chest pain which started yesterday  States he took a NTG  and the pain subsided  This AM describes pain as pressure with some SOB

## 2023-11-29 NOTE — ED Provider Notes (Signed)
 [facility] Provider Note    Event Date/Time   First MD Initiated Contact with Patient 11/29/23 [phone]     (approximate)  History   Chief Complaint: Chest Pain  HPI  Ronald Malone is a 68 y.o. male with a past history of CHF, COPD, diabetes, hypertension, prior MI 10 years ago, prior PE 5 years ago on Eliquis  who presents to the emergency department for chest pain.  According to the patient since yesterday morning (24 hours) he has been experiencing pain in the center of his chest.  Patient states it feels somewhat similar to his heart attack he had 10 years ago.  Patient denies any pleuritic pain.  No fever cough or congestion.  No leg pain or swelling over baseline.  Currently rates his chest pain as a 7/10.  Physical Exam   Triage Vital Signs: ED Triage Vitals  Encounter Vitals Group     BP 11/29/23 0726 (!) 143/80     Girls Systolic BP Percentile --      Girls Diastolic BP Percentile --      Boys Systolic BP Percentile --      Boys Diastolic BP Percentile --      Pulse Rate 11/29/23 0726 80     Resp 11/29/23 0726 18     Temp 11/29/23 0808 97.6 F (36.4 C)     Temp Source 11/29/23 0808 Oral     SpO2 11/29/23 0726 97 %     Weight 11/29/23 0759 (!) 320 lb 1.7 oz (145.2 kg)     Height 11/29/23 0759 6' (1.829 m)     Head Circumference --      Peak Flow --      Pain Score 11/29/23 0726 7     Pain Loc --      Pain Education --      Exclude from Growth Chart --     Most recent vital signs: Vitals:   11/29/23 0726 11/29/23 0808  BP: (!) 143/80   Pulse: 80   Resp: 18   Temp:  97.6 F (36.4 C)  SpO2: 97%     General: Awake, no distress.  CV:  Good peripheral perfusion.  Regular rate and rhythm  Resp:  Normal effort.  Equal breath sounds bilaterally.  Abd:  No distention.  Soft, nontender.  No rebound or guarding. Other:  No significant lower extremity edema.  No calf tenderness.   ED Results / Procedures / Treatments   EKG  EKG viewed  and interpreted by myself shows sinus bradycardia 58 bpm with a narrow QRS, normal axis, normal intervals, nonspecific ST changes occasional PVC.  No ST elevation.  RADIOLOGY  I have reviewed interpret the chest x-ray images.  No obvious consolidation on my evaluation. Radiology has read the x-ray as negative.   MEDICATIONS ORDERED IN ED: Medications  aspirin  chewable tablet 324 mg (has no administration in time range)     IMPRESSION / MDM / ASSESSMENT AND PLAN / ED COURSE  I reviewed the triage vital signs and the nursing notes.  Patient's presentation is most consistent with acute presentation with potential threat to life or bodily function.  Patient presents to the emergency department for chest pain ongoing since yesterday.  Overall the patient appears well, no distress continues to rate his pain is a 7/10 in the center of his chest.  No radiation.  No nausea or diaphoresis.  No shortness of breath.  Patient states this does feel somewhat similar  to his past MI 10 years ago.  Chest x-ray is clear and EKG reassuring.  Will check labs including cardiac enzymes.  Will dose aspirin  while awaiting results.  Patient agreeable to plan.  Patient's complaints resulted showing a reassuring CBC reassuring chemistry but the patient's troponin is elevated to 2953 concerning for NSTEMI.  I have ordered a heparin  infusion for the patient.  No ST elevation on EKG.  I reached out to [facility] clinic cardiology and they will be seeing the patient today in consult.  Patient mid to the hospital service for further workup and treatment.  CRITICAL CARE Performed by: Franky Moores   Total critical care time: 30 minutes  Critical care time was exclusive of separately billable procedures and treating other patients.  Critical care was necessary to treat or prevent imminent or life-threatening deterioration.  Critical care was time spent personally by me on the following activities: development of  treatment plan with patient and/or surrogate as well as nursing, discussions with consultants, evaluation of patient's response to treatment, examination of patient, obtaining history from patient or surrogate, ordering and performing treatments and interventions, ordering and review of laboratory studies, ordering and review of radiographic studies, pulse oximetry and re-evaluation of patient's condition.   FINAL CLINICAL IMPRESSION(S) / ED DIAGNOSES   Chest pain NSTEMI   Note:  This document was prepared using Dragon voice recognition software and may include unintentional dictation errors.   Moores Franky, MD 11/29/23 [phone]

## 2023-11-29 NOTE — Consult Note (Signed)
 PHARMACY - ANTICOAGULATION CONSULT NOTE  Pharmacy Consult for heparin  infusion Indication: ACS/STEMI  No Known Allergies  Patient Measurements: Height: 6' (182.9 cm) Weight: (!) 145.2 kg (320 lb 1.7 oz) IBW/kg (Calculated) : 77.6 HEPARIN  DW (KG): 111.5  Vital Signs: Temp: 98 F (36.7 C) (09/16 1711) Temp Source: Oral (09/16 1711) BP: 120/64 (09/16 1711) Pulse Rate: 72 (09/16 1711)  Labs: Recent Labs    11/29/23 0730 11/29/23 0936 11/29/23 1800  HGB 15.0  --   --   HCT 46.1  --   --   PLT 245  --   --   APTT  --  31  --   LABPROT  --  14.7  --   INR  --  1.1  --   HEPARINUNFRC  --  0.46 0.40  CREATININE 0.91  --   --   TROPONINIHS 2,953* 2,747*  --     Estimated Creatinine Clearance: 111.8 mL/min (by C-G formula based on SCr of 0.91 mg/dL).   Medical History: Past Medical History:  Diagnosis Date   Cataract 9/22   CHF (congestive heart failure) (HCC)    Clotting disorder (HCC)    COPD (chronic obstructive pulmonary disease) (HCC)    Diabetes mellitus without complication (HCC)    Hypertension    MI (myocardial infarction) (HCC) 2015   Peripheral neuropathy    Pulmonary embolism (HCC) 2020   Sleep apnea    CPAP   Torn meniscus    right   Wears dentures    full upper, partial lower   Wears hearing aid in right ear     Medications:  Patient takes Eliquis  2.5 mg po BID for Hx of DVT and PAD.  Last dose 11/28/23 @ 2200.  Assessment: 68 yo male presented to ED with chest pain that began 11/28/23.  PMH includes PAD, PE/DVT, CHF, COPD, HTN, CAD, and DM.  In ED patient found to have elevated troponin.  Pharmacy consulted for initiation of heparin  infusion.  Baseline HL 0.46, aPTT 31, and PT/INR 14.7/1.1--> HL not elevated, can move forward monitoring via heparin  level instead of aPTT  0916 1800 HL 0.4, therapeutic x 1  Goal of Therapy:  Heparin  level 0.3-0.7 units/ml aPTT 66-102 seconds Monitor platelets by anticoagulation protocol: Yes   Plan:   Heparin  level therapeutic x 1 Continue heparin  infusion at 1450 units/hr Check confirmatory HL in 6 hours Daily CBC while on heparin   Lum VEAR Mania, PharmD, BCPS 11/29/2023,6:30 PM

## 2023-11-29 NOTE — H&P (Signed)
 History and Physical    BRASEN BUNDREN FMW:[id number] DOB: 1955-11-26 DOA: 11/29/2023  DOS: the patient was seen and examined on 11/29/2023  PCP: Gasper Nancyann BRAVO, MD   Patient coming from: Home  I have personally briefly reviewed patient's old medical records in [facility]  Chief Complaint: Chest pain since yesterday morning around 10 AM  HPI: JAMARIO COLINA is a pleasant 68 y.o. male with medical history significant for CHF, COPD, obstructive sleep apnea on CPAP, DM, HTN, history of MI 10 years ago s/p stent, history of PE on Eliquis  who [facility] ED [facility] for chest pain that started yesterday.  According to the patient, he had chest pain that started 24 hours ago, which was 7/10 in intensity, pressure-like, retrosternal, nonradiating.  He feels like this is the chest pain he had 10 years ago when he had a heart attack.  He denies any chest pain on deep breathing, denies cough, fever, congestion, denies any leg swelling.  ED Course: Upon arrival to the ED, patient is found to be in chest pain, EKG showed sinus bradycardia at 58 bpm with narrow complex normal axis no ST elevation, chest x-ray no consolidation, troponin or positive around 2900.  Patient was given aspirin  324 milligram, heparin  drip also started, cardiology paged and hospitalist service was consulted for evaluation for admission.  Review of Systems:  ROS  All other systems negative except as noted in the HPI.  Past Medical History:  Diagnosis Date   Cataract 9/22   CHF (congestive heart failure) (HCC)    Clotting disorder (HCC)    COPD (chronic obstructive pulmonary disease) (HCC)    Diabetes mellitus without complication (HCC)    Hypertension    MI (myocardial infarction) (HCC) 2015   Peripheral neuropathy    Pulmonary embolism (HCC) 2020   Sleep apnea    CPAP   Torn meniscus    right   Wears dentures    full upper, partial lower   Wears hearing aid in right ear     Past Surgical History:   Procedure Laterality Date   CATARACT EXTRACTION W/PHACO Right 01/13/2022   Procedure: CATARACT EXTRACTION PHACO AND INTRAOCULAR LENS PLACEMENT (IOC) RIGHT 9.56 01:34.8;  Surgeon: Mittie Gaskin, MD;  Location: [facility];  Service: Ophthalmology;  Laterality: Right;  Diabetic   CATARACT EXTRACTION W/PHACO Left 01/27/2022   Procedure: CATARACT EXTRACTION PHACO AND INTRAOCULAR LENS PLACEMENT (IOC) LEFT 3.42  00:52.2;  Surgeon: Mittie Gaskin, MD;  Location: [facility];  Service: Ophthalmology;  Laterality: Left;  Diabetic   COLON SURGERY     1/3 colon removed as an infant   COLONOSCOPY WITH PROPOFOL  N/A 12/26/2018   Procedure: COLONOSCOPY WITH PROPOFOL ;  Surgeon: Jinny Carmine, MD;  Location: [facility];  Service: Endoscopy;  Laterality: N/A;   COLONOSCOPY WITH PROPOFOL  N/A 04/01/2020   Procedure: COLONOSCOPY WITH PROPOFOL ;  Surgeon: Jinny Carmine, MD;  Location: [facility] END[facility];  Service: Endoscopy;  Laterality: N/A;   CORONARY ANGIOPLASTY WITH STENT PLACEMENT  2015   DES STENT POST MI, [place]    LOWER EXTREMITY ANGIOGRAPHY Right 10/23/2018   Procedure: LOWER EXTREMITY ANGIOGRAPHY;  Surgeon: Marea Selinda RAMAN, MD;  Location: [facility];  Service: Cardiovascular;  Laterality: Right;   SMALL INTESTINE SURGERY  1955     reports that he has been smoking cigarettes. He started smoking about 47 years ago. He has a 95.4 pack-year smoking history. He has never used smokeless tobacco. He reports current alcohol  use of about 2.0 standard drinks of alcohol per week. He reports current drug use. Drug: Marijuana.  No Known Allergies  Family History  Problem Relation Age of Onset   Parkinson's disease Mother    Dementia Father     Prior to Admission medications   Medication Sig Start Date End Date Taking? Authorizing Provider  apixaban  (ELIQUIS ) 2.5 MG TABS tablet Take 1 tablet (2.5 mg total) by mouth 2 (two) times daily. 11/11/23  Yes Gasper Nancyann BRAVO, MD  atorvastatin  (LIPITOR) 20 MG tablet Take 20 mg by mouth daily. 09/04/19  Yes [provider]  azelastine (ASTELIN) 0.1 % nasal spray Place into both nostrils 2 (two) times daily. Use in each nostril as directed   Yes [provider]  COMBIVENT RESPIMAT 20-100 MCG/ACT AERS respimat Inhale 1 puff into the lungs every 6 (six) hours as needed. 04/09/20  Yes [provider]  losartan  (COZAAR ) 100 MG tablet Take 100 mg by mouth daily. 11/25/23  Yes [provider]  metFORMIN  (GLUCOPHAGE -XR) 500 MG 24 hr tablet TAKE 2 TABLETS BY MOUTH TWICE A DAY 03/15/23  Yes Gasper Nancyann BRAVO, MD  metoprolol  succinate (TOPROL -XL) 50 MG 24 hr tablet Take 1 tablet by mouth daily. 10/26/22 11/29/23 Yes [provider]  nitroGLYCERIN  (NITROSTAT ) 0.4 MG SL tablet Place 1 tablet (0.4 mg total) under the tongue every 5 (five) minutes as needed for chest pain. 10/31/23  Yes Gasper Nancyann BRAVO, MD  Semaglutide ,0.25 or 0.5MG /DOS, (OZEMPIC , 0.25 OR 0.5 MG/DOSE,) 2 MG/3ML SOPN Start 0.25mg  once a week for 4 weeks, then increase to 0.5mg  once a week 11/01/23  Yes Gasper Nancyann BRAVO, MD  Blood Glucose Monitoring Suppl (ACCU-CHEK GUIDE) w/Device KIT Use to check blood sugar daily for type 2 diabetes E11.9 06/03/23   Gasper Nancyann BRAVO, MD  dapagliflozin  propanediol (FARXIGA ) 10 MG TABS tablet Take 10 mg by mouth daily.    [provider]  glucose blood (ACCU-CHEK GUIDE TEST) test strip Use as instructed to check blood sugar daily for type 2 diabetes E11.9 06/03/23   Gasper Nancyann BRAVO, MD  spironolactone  (ALDACTONE ) 25 MG tablet Take 25 mg by mouth daily.    [provider]    Physical Exam: Vitals:   11/29/23 0726 11/29/23 0759 11/29/23 0808 11/29/23 1042  BP: (!) 143/80   (!) 140/78  Pulse: 80   82  Resp: 18   18  Temp:   97.6 F (36.4 C)   TempSrc:   Oral   SpO2: 97%   98%  Weight:  (!) 145.2 kg    Height:  6' (1.829 m)      Physical Exam   Constitutional: Alert,  awake, calm, comfortable HEENT: Neck supple Respiratory: Clear to auscultation B/L, no wheezing, no rales.  Cardiovascular: Regular rate and rhythm, no murmurs / rubs / gallops. No extremity edema. 2+ pedal pulses. No carotid bruits.  Abdomen: Soft, no tenderness, Bowel sounds positive.  Obese abdomen Musculoskeletal: no clubbing / cyanosis. Good ROM, no contractures. Normal muscle tone.  Skin: no rashes, lesions, ulcers. Neurologic: CN 2-12 grossly intact. Sensation intact, No focal deficit identified Psychiatric: Alert and oriented x 3. Normal mood.    Labs on Admission: I have personally reviewed following labs and imaging studies  CBC: Recent Labs  Lab 11/29/23 0730  WBC 10.8*  HGB 15.0  HCT 46.1  MCV 97.3  PLT 245   Basic Metabolic Panel: Recent Labs  Lab 11/29/23 0730  NA 136  K 4.3  CL 101  CO2 24  GLUCOSE 128*  BUN 14  CREATININE 0.91  CALCIUM  9.1   GFR: Estimated Creatinine Clearance: 111.8 mL/min (by C-G formula based on SCr of 0.91 mg/dL). Liver Function Tests: No results for input(s): AST, ALT, ALKPHOS, BILITOT, PROT, ALBUMIN in the last 168 hours. No results for input(s): LIPASE, AMYLASE in the last 168 hours. No results for input(s): AMMONIA in the last 168 hours. Coagulation Profile: Recent Labs  Lab 11/29/23 0936  INR 1.1   Cardiac Enzymes: Recent Labs  Lab 11/29/23 0730 11/29/23 0936  TROPONINIHS 2,953* 2,747*   BNP (last 3 results) No results for input(s): BNP in the last 8760 hours. HbA1C: No results for input(s): HGBA1C in the last 72 hours. CBG: No results for input(s): GLUCAP in the last 168 hours. Lipid Profile: No results for input(s): CHOL, HDL, LDLCALC, TRIG, CHOLHDL, LDLDIRECT in the last 72 hours. Thyroid  Function Tests: No results for input(s): TSH, T4TOTAL, FREET4, T3FREE, THYROIDAB in the last 72 hours. Anemia Panel: No results for input(s): VITAMINB12, FOLATE, FERRITIN,  TIBC, IRON, RETICCTPCT in the last 72 hours. Urine analysis:    Component Value Date/Time   COLORURINE YELLOW (A) 09/06/2019 2216   APPEARANCEUR HAZY (A) 09/06/2019 2216   LABSPEC 1.020 09/06/2019 2216   PHURINE 5.0 09/06/2019 2216   GLUCOSEU NEGATIVE 09/06/2019 2216   HGBUR NEGATIVE 09/06/2019 2216   BILIRUBINUR NEGATIVE 09/06/2019 2216   KETONESUR 5 (A) 09/06/2019 2216   PROTEINUR NEGATIVE 09/06/2019 2216   NITRITE NEGATIVE 09/06/2019 2216   LEUKOCYTESUR NEGATIVE 09/06/2019 2216    Radiological Exams on Admission: I have personally reviewed images DG Chest 2 View Result Date: 11/29/2023 CLINICAL DATA:  Chest pain EXAM: CHEST - 2 VIEW COMPARISON:  None Available. FINDINGS: The lungs are clear without focal pneumonia, edema, pneumothorax or pleural effusion. Hazy opacity in the medial right base stable since scout image for CT 07/30/2022 and compatible with fat pad. The cardiopericardial silhouette is within normal limits for size. No acute bony abnormality. IMPRESSION: No active cardiopulmonary disease. Electronically Signed   By: Camellia Candle M.D.   On: 11/29/2023 07:57    EKG: My personal interpretation of EKG shows: Sinus bradycardia at 58 bpm with narrow complex QRS, normal axis, no ST elevation.    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  1.  Acute coronary syndrome/acute non-ST elevation  myocardial infarction - Patient will be admitted to hospital as inpatient - Received aspirin  324 mg in the ED - He has been started on heparin  drip - Cardiology has been called from ED will follow the recommendation - Trend troponin, monitoring telemetry, pain medication with morphine  - Further plan per cardiology, most likely need to go to cardiac catheterization  2.  Diabetes - Continue on insulin  sliding scale  3.  Obstructive sleep apnea on CPAP - Resume home CPAP  4.  History of congestive heart failure - Patient  is on Aldactone , hold Aldactone  for anticipation for going to the Cath Lab  5.  History of PE on Eliquis  - Patient is already on heparin  drip  6.  Chronic tobacco use disorder - Extensive counseling was done     DVT prophylaxis: IV heparin  gtts Code Status: Full Code Family Communication: Family wife and daughter at bedside Disposition Plan: Home Consults called: Cardiology Admission status: Inpatient, Telemetry bed   Nena Rebel, MD [facility] 11/29/2023, 10:54 AM

## 2023-11-29 NOTE — Consult Note (Signed)
 [name] CARDIOLOGY CONSULT NOTE       Patient ID: RAJVEER HANDLER MRN: [id number] DOB/AGE: 70-06-1955 70 y.o.  Admit date: 11/29/2023 Referring Physician Dr. Dorothyann Primary Physician Gasper, Nancyann BRAVO, MD Primary Cardiologist Dr. Florencio Reason for Consultation elevated troponin  HPI: Ronald Malone is a 68 y.o. 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 Cardiology was consulted for further evaluation.   Work up in the ED notable for sodium 136, potassium 4.3, creatinine 0.91, hemoglobin 15, platelets 245.  Troponins elevated at 2900,next level pending.  EKG with sinus rhythm, 58 bpm with no acute ischemic changes.  CXR with no acute cardiopulmonary disease.  Patient given 1X aspirin  324 mg and started on IV heparin  infusion.  At the time of my evaluation this AM, patient was resting comfortably in ED stretcher with family at bedside. We discussed patient's sxs in further detail. Patient states yesterday he had acute onset chest pain/tightness, rates 7/10 and persisted. Patient states he took SL nitroglycerin  at home with no relief.  Patient states when he got to the ED and received IV morphine  that relieved his chest pain.  Patient reports mild shortness of breath associated with chest pain.  Patient denies any palpitations, lightheadedness/dizziness or diaphoresis.  Patient states his chest pain/tightness felt the same as when he needed a left heart cath with stent placement over 10 years ago.  Currently patient states he has no more chest pain or SOB.  Stress test Recent Myoview  stress (10/12/22), LV perfusion is abnormal. There is no evidence of ischemia.   Review of systems complete and found to be negative unless listed above    Past Medical History:   Diagnosis Date   Cataract 9/22   CHF (congestive heart failure) (HCC)    Clotting disorder (HCC)    COPD (chronic obstructive pulmonary disease) (HCC)    Diabetes mellitus without complication (HCC)    Hypertension    MI (myocardial infarction) (HCC) 2015   Peripheral neuropathy    Pulmonary embolism (HCC) 2020   Sleep apnea    CPAP   Torn meniscus    right   Wears dentures    full upper, partial lower   Wears hearing aid in right ear     Past Surgical History:  Procedure Laterality Date   CATARACT EXTRACTION W/PHACO Right 01/13/2022   Procedure: CATARACT EXTRACTION PHACO AND INTRAOCULAR LENS PLACEMENT (IOC) RIGHT 9.56 01:34.8;  Surgeon: Mittie Gaskin, MD;  Location: [facility];  Service: Ophthalmology;  Laterality: Right;  Diabetic   CATARACT EXTRACTION W/PHACO Left 01/27/2022   Procedure: CATARACT EXTRACTION PHACO AND INTRAOCULAR LENS PLACEMENT (IOC) LEFT 3.42  00:52.2;  Surgeon: Mittie Gaskin, MD;  Location: [facility];  Service: Ophthalmology;  Laterality: Left;  Diabetic   COLON SURGERY     1/3 colon removed as an infant   COLONOSCOPY WITH PROPOFOL  N/A 12/26/2018   Procedure: COLONOSCOPY WITH PROPOFOL ;  Surgeon: Jinny Carmine, MD;  Location: [facility];  Service: Endoscopy;  Laterality: N/A;   COLONOSCOPY WITH PROPOFOL  N/A 04/01/2020   Procedure: COLONOSCOPY WITH PROPOFOL ;  Surgeon: Jinny Carmine, MD;  Location: [facility];  Service: Endoscopy;  Laterality: N/A;   CORONARY ANGIOPLASTY WITH STENT PLACEMENT  2015   DES STENT POST MI, [place]    LOWER EXTREMITY ANGIOGRAPHY  Right 10/23/2018   Procedure: LOWER EXTREMITY ANGIOGRAPHY;  Surgeon: Marea Selinda RAMAN, MD;  Location: [facility];  Service: Cardiovascular;  Laterality: Right;   SMALL INTESTINE SURGERY  1955    (Not in a hospital admission)  Social History   Socioeconomic History   Marital status: Married    Spouse name: Arland    Number of children: 2   Years  of education: Not on file   Highest education level: Bachelor's degree (e.g., BA, AB, BS)  Occupational History   Occupation: RETIRED   Tobacco Use   Smoking status: Every Day    Current packs/day: 2.00    Average packs/day: 2.0 packs/day for 47.7 years (95.4 ttl pk-yrs)    Types: Cigarettes    Start date: 21   Smokeless tobacco: Never  Vaping Use   Vaping status: Never Used  Substance and Sexual Activity   Alcohol use: Yes    Alcohol/week: 2.0 standard drinks of alcohol    Types: 2 Cans of beer per week    Comment: once a month   Drug use: Yes    Types: Marijuana    Comment: occasional   Sexual activity: Not Currently    Birth control/protection: None  Other Topics Concern   Not on file  Social History Narrative   Not on file   Social Drivers of Health   Financial Resource Strain: Low Risk  (11/18/2023)   Received from [facility]   Overall Financial Resource Strain (CARDIA)    Difficulty of Paying Living Expenses: Not hard at all  Food Insecurity: No Food Insecurity (11/18/2023)   Received from [facility]   Hunger Vital Sign    Within the past 12 months, you worried that your food would run out before you got the money to buy more.: Never true    Within the past 12 months, the food you bought just didn't last and you didn't have money to get more.: Never true  Transportation Needs: No Transportation Needs (11/18/2023)   Received from [facility] - Transportation    In the past 12 months, has lack of transportation kept you from medical appointments or from getting medications?: No    Lack of Transportation (Non-Medical): No  Physical Activity: Insufficiently Active (10/27/2023)   Exercise Vital Sign    Days of Exercise per Week: 1 day    Minutes of Exercise per Session: 90 min  Stress: No Stress Concern Present (10/27/2023)   [employer] of Occupational Health - Occupational Stress Questionnaire     Feeling of Stress: Not at all  Social Connections: Moderately Isolated (10/27/2023)   Social Connection and Isolation Panel    Frequency of Communication with Friends and Family: Never    Frequency of Social Gatherings with Friends and Family: Once a week    Attends Religious Services: Never    Database administrator or Organizations: Yes    Attends Banker Meetings: Never    Marital Status: Married  Catering manager Violence: Not At Risk (04/26/2023)   Humiliation, Afraid, Rape, and Kick questionnaire    Fear of Current or Ex-Partner: No    Emotionally Abused: No    Physically Abused: No    Sexually Abused: No    Family History  Problem Relation Age of Onset   Parkinson's disease Mother    Dementia Father      Vitals:   11/29/23 0726 11/29/23 0759 11/29/23 0808  BP: ROLLEN)  143/80    Pulse: 80    Resp: 18    Temp:   97.6 F (36.4 C)  TempSrc:   Oral  SpO2: 97%    Weight:  (!) 145.2 kg   Height:  6' (1.829 m)     PHYSICAL EXAM General: Chronically ill-appearing elderly male, well nourished, in no acute distress. HEENT: Normocephalic and atraumatic. Neck: No JVD.   Lungs: Normal respiratory effort on room air. Clear bilaterally to auscultation. No wheezes, crackles, rhonchi.  Heart: HRRR. Normal S1 and S2 without gallops or murmurs.  Abdomen: Non-distended appearing.  Msk: Normal strength and tone for age. Extremities: Warm and well perfused. No clubbing, cyanosis, edema.  Neuro: Alert and oriented X 3. Psych: Answers questions appropriately.   Labs: Basic Metabolic Panel: Recent Labs    11/29/23 0730  NA 136  K 4.3  CL 101  CO2 24  GLUCOSE 128*  BUN 14  CREATININE 0.91  CALCIUM  9.1   Liver Function Tests: No results for input(s): AST, ALT, ALKPHOS, BILITOT, PROT, ALBUMIN in the last 72 hours. No results for input(s): LIPASE, AMYLASE in the last 72 hours. CBC: Recent Labs    11/29/23 0730  WBC 10.8*  HGB 15.0  HCT 46.1  MCV  97.3  PLT 245   Cardiac Enzymes: Recent Labs    11/29/23 0730  TROPONINIHS 2,953*   BNP: No results for input(s): BNP in the last 72 hours. D-Dimer: No results for input(s): DDIMER in the last 72 hours. Hemoglobin A1C: No results for input(s): HGBA1C in the last 72 hours. Fasting Lipid Panel: No results for input(s): CHOL, HDL, LDLCALC, TRIG, CHOLHDL, LDLDIRECT in the last 72 hours. Thyroid  Function Tests: No results for input(s): TSH, T4TOTAL, T3FREE, THYROIDAB in the last 72 hours.  Invalid input(s): FREET3 Anemia Panel: No results for input(s): VITAMINB12, FOLATE, FERRITIN, TIBC, IRON, RETICCTPCT in the last 72 hours.   Radiology: DG Chest 2 View Result Date: 11/29/2023 CLINICAL DATA:  Chest pain EXAM: CHEST - 2 VIEW COMPARISON:  None Available. FINDINGS: The lungs are clear without focal pneumonia, edema, pneumothorax or pleural effusion. Hazy opacity in the medial right base stable since scout image for CT 07/30/2022 and compatible with fat pad. The cardiopericardial silhouette is within normal limits for size. No acute bony abnormality. IMPRESSION: No active cardiopulmonary disease. Electronically Signed   By: Camellia Candle M.D.   On: 11/29/2023 07:57    ECHO ordered.  TELEMETRY reviewed by me 11/29/2023: Sinus rhythm, rate 90s  EKG reviewed by me: sinus rhythm, 58 bpm with no acute ischemic changes  Data reviewed by me 11/29/2023: last 24h vitals tele labs imaging I/O ED provider note, admission H&P.  Active Problems:   * No active hospital problems. *    ASSESSMENT AND PLAN:  Ronald Malone is a 68 y.o. 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 Cardiology was consulted for further evaluation.   # NSTEMI #  Coronary artery disease s/p hx stent # Hypertension # Hyperlipidemia Patient presents with chest pain. States feels the same as when he needed LHC with stent over 10 years ago. Troponins elevated at 2900 > 2700.  EKG with sinus rhythm, 58 bpm with no acute ischemic changes - Echo Ordered. - Continue IV heparin  infusion. - S/p ASA 324 mg.  Continue ASA 81 mg daily.  - Increase atorvastatin  to 40 mg daily.   - Continue losartan  100 mg daily.  - Continue metoprolol  succinate 50 mg daily.  -Discussed the risks and benefits of proceeding with LHC for further evaluation with the patient.   He is agreeable to proceed.  NPO at midnight until [facility] tomorow afternoon (09/17 at 12:30 PM) with Dr. Florencio.  Written consent will be obtained.  Further recommendations following LHC.   # Chronic HFmrEF - Continue dapagliflozin  10 mg daily. - Continue spironolactone  25 mg daily. - Continue losartan  and metoprolol  stated above.  This patient's plan of care was discussed and created with Dr. Custovic and she is in agreement.  Signed: Dorene Comfort, PA-C  11/29/2023, 9:46 AM [facility]

## 2023-11-29 NOTE — ED Notes (Signed)
 Pt moved to room 36 with family

## 2023-11-30 ENCOUNTER — Other Ambulatory Visit: Payer: Self-pay

## 2023-11-30 ENCOUNTER — Encounter
Admission: EM | Disposition: A | Discharge status: Other Acute Inpatient Hospital | Payer: Self-pay | Source: Home / Self Care | Attending: Hospitalist

## 2023-11-30 DIAGNOSIS — I5023 Acute on chronic systolic (congestive) heart failure: Secondary | ICD-10-CM | POA: Diagnosis not present

## 2023-11-30 DIAGNOSIS — I5021 Acute systolic (congestive) heart failure: Secondary | ICD-10-CM | POA: Diagnosis not present

## 2023-11-30 DIAGNOSIS — I21A9 Other myocardial infarction type: Secondary | ICD-10-CM | POA: Diagnosis not present

## 2023-11-30 DIAGNOSIS — I110 Hypertensive heart disease with heart failure: Secondary | ICD-10-CM | POA: Diagnosis not present

## 2023-11-30 DIAGNOSIS — E119 Type 2 diabetes mellitus without complications: Secondary | ICD-10-CM | POA: Diagnosis not present

## 2023-11-30 DIAGNOSIS — R7989 Other specified abnormal findings of blood chemistry: Secondary | ICD-10-CM | POA: Diagnosis not present

## 2023-11-30 DIAGNOSIS — G4733 Obstructive sleep apnea (adult) (pediatric): Secondary | ICD-10-CM | POA: Diagnosis not present

## 2023-11-30 DIAGNOSIS — I214 Non-ST elevation (NSTEMI) myocardial infarction: Secondary | ICD-10-CM | POA: Diagnosis not present

## 2023-11-30 DIAGNOSIS — Z7901 Long term (current) use of anticoagulants: Secondary | ICD-10-CM | POA: Diagnosis not present

## 2023-11-30 DIAGNOSIS — E782 Mixed hyperlipidemia: Secondary | ICD-10-CM | POA: Diagnosis not present

## 2023-11-30 DIAGNOSIS — Z955 Presence of coronary angioplasty implant and graft: Secondary | ICD-10-CM | POA: Diagnosis not present

## 2023-11-30 DIAGNOSIS — I509 Heart failure, unspecified: Secondary | ICD-10-CM | POA: Diagnosis not present

## 2023-11-30 DIAGNOSIS — Z7982 Long term (current) use of aspirin: Secondary | ICD-10-CM | POA: Diagnosis not present

## 2023-11-30 DIAGNOSIS — Z7984 Long term (current) use of oral hypoglycemic drugs: Secondary | ICD-10-CM | POA: Diagnosis not present

## 2023-11-30 DIAGNOSIS — Z9989 Dependence on other enabling machines and devices: Secondary | ICD-10-CM | POA: Diagnosis not present

## 2023-11-30 DIAGNOSIS — I82509 Chronic embolism and thrombosis of unspecified deep veins of unspecified lower extremity: Secondary | ICD-10-CM | POA: Diagnosis not present

## 2023-11-30 DIAGNOSIS — I10 Essential (primary) hypertension: Secondary | ICD-10-CM | POA: Diagnosis not present

## 2023-11-30 DIAGNOSIS — Z79899 Other long term (current) drug therapy: Secondary | ICD-10-CM | POA: Diagnosis not present

## 2023-11-30 DIAGNOSIS — I252 Old myocardial infarction: Secondary | ICD-10-CM | POA: Diagnosis not present

## 2023-11-30 DIAGNOSIS — E785 Hyperlipidemia, unspecified: Secondary | ICD-10-CM | POA: Diagnosis not present

## 2023-11-30 DIAGNOSIS — Z9981 Dependence on supplemental oxygen: Secondary | ICD-10-CM | POA: Diagnosis not present

## 2023-11-30 DIAGNOSIS — J449 Chronic obstructive pulmonary disease, unspecified: Secondary | ICD-10-CM | POA: Diagnosis not present

## 2023-11-30 DIAGNOSIS — I5020 Unspecified systolic (congestive) heart failure: Secondary | ICD-10-CM | POA: Diagnosis not present

## 2023-11-30 DIAGNOSIS — I25118 Atherosclerotic heart disease of native coronary artery with other forms of angina pectoris: Secondary | ICD-10-CM | POA: Diagnosis not present

## 2023-11-30 DIAGNOSIS — J439 Emphysema, unspecified: Secondary | ICD-10-CM | POA: Diagnosis not present

## 2023-11-30 DIAGNOSIS — I2510 Atherosclerotic heart disease of native coronary artery without angina pectoris: Secondary | ICD-10-CM | POA: Diagnosis not present

## 2023-11-30 DIAGNOSIS — Z86711 Personal history of pulmonary embolism: Secondary | ICD-10-CM | POA: Diagnosis not present

## 2023-11-30 DIAGNOSIS — Z9861 Coronary angioplasty status: Secondary | ICD-10-CM | POA: Diagnosis not present

## 2023-11-30 DIAGNOSIS — I2121 ST elevation (STEMI) myocardial infarction involving left circumflex coronary artery: Secondary | ICD-10-CM | POA: Diagnosis not present

## 2023-11-30 DIAGNOSIS — F17210 Nicotine dependence, cigarettes, uncomplicated: Secondary | ICD-10-CM | POA: Diagnosis not present

## 2023-11-30 DIAGNOSIS — I2102 ST elevation (STEMI) myocardial infarction involving left anterior descending coronary artery: Secondary | ICD-10-CM | POA: Diagnosis not present

## 2023-11-30 DIAGNOSIS — T82855A Stenosis of coronary artery stent, initial encounter: Secondary | ICD-10-CM | POA: Diagnosis not present

## 2023-11-30 HISTORY — PX: LEFT HEART CATH AND CORONARY ANGIOGRAPHY: CATH118249

## 2023-11-30 LAB — GLUCOSE, CAPILLARY
Glucose-Capillary: 134 mg/dL — ABNORMAL HIGH (ref 70–99)
Glucose-Capillary: 135 mg/dL — ABNORMAL HIGH (ref 70–99)
Glucose-Capillary: 129 mg/dL — ABNORMAL HIGH (ref 70–99)

## 2023-11-30 LAB — CBC
HCT: 43.2 % (ref 39.0–52.0)
Hemoglobin: 14 g/dL (ref 13.0–17.0)
MCH: 31.2 pg (ref 26.0–34.0)
MCHC: 32.4 g/dL (ref 30.0–36.0)
MCV: 96.2 fL (ref 80.0–100.0)
Platelets: 204 K/uL (ref 150–400)
RBC: 4.49 MIL/uL (ref 4.22–5.81)
RDW: 13.1 % (ref 11.5–15.5)
WBC: 8.6 K/uL (ref 4.0–10.5)
nRBC: 0 % (ref 0.0–0.2)

## 2023-11-30 LAB — ECHOCARDIOGRAM COMPLETE
AR max vel: 4.29 cm2
AV Area VTI: 4.37 cm2
AV Area mean vel: 4.19 cm2
AV Mean grad: 2 mmHg
AV Peak grad: 4.6 mmHg
Ao pk vel: 1.07 m/s
Area-P 1/2: 3.21 cm2
Calc EF: 31.9 %
Height: 72 in
MV VTI: 2.55 cm2
S' Lateral: 4.67 cm
Single Plane A2C EF: 52.4 %
Single Plane A4C EF: 20.1 %
Weight: 5121.73 [oz_av]

## 2023-11-30 LAB — COMPREHENSIVE METABOLIC PANEL WITH GFR
ALT: 20 U/L (ref 0–44)
AST: 25 U/L (ref 15–41)
Albumin: 3.4 g/dL — ABNORMAL LOW (ref 3.5–5.0)
Alkaline Phosphatase: 61 U/L (ref 38–126)
Anion gap: 8 (ref 5–15)
BUN: 16 mg/dL (ref 8–23)
CO2: 25 mmol/L (ref 22–32)
Calcium: 8.7 mg/dL — ABNORMAL LOW (ref 8.9–10.3)
Chloride: 104 mmol/L (ref 98–111)
Creatinine, Ser: 0.94 mg/dL (ref 0.61–1.24)
GFR, Estimated: >60 mL/min (ref >60)
Glucose, Bld: 129 mg/dL — ABNORMAL HIGH (ref 70–99)
Potassium: 4.2 mmol/L (ref 3.5–5.1)
Sodium: 137 mmol/L (ref 135–145)
Total Bilirubin: 0.6 mg/dL (ref 0.0–1.2)
Total Protein: 6.8 g/dL (ref 6.5–8.1)

## 2023-11-30 LAB — PROTIME-INR
INR: 1.1 (ref 0.8–1.2)
Prothrombin Time: 14.4 s (ref 11.4–15.2)

## 2023-11-30 LAB — HEPARIN LEVEL (UNFRACTIONATED)
Heparin Unfractionated: 0.34 [IU]/mL (ref 0.30–0.70)
Heparin Unfractionated: 0.35 [IU]/mL (ref 0.30–0.70)

## 2023-11-30 SURGERY — LEFT HEART CATH AND CORONARY ANGIOGRAPHY
Anesthesia: Moderate Sedation

## 2023-11-30 MED ORDER — HEPARIN (PORCINE) IN NACL 1000-0.9 UT/500ML-% IV SOLN
INTRAVENOUS | Status: AC
  Filled 2023-11-30: qty 1000

## 2023-11-30 MED ORDER — HEPARIN SODIUM (PORCINE) 1000 UNIT/ML IJ SOLN
INTRAMUSCULAR | Status: AC
  Filled 2023-11-30: qty 10

## 2023-11-30 MED ORDER — POLYETHYLENE GLYCOL 3350 17 GM/SCOOP PO POWD
17.0000 g | Freq: Every day | ORAL | 0 refills | Status: AC | PRN
  Filled 2023-11-30: qty 238, 14d supply, fill #0

## 2023-11-30 MED ORDER — HYDRALAZINE HCL 20 MG/ML IJ SOLN: 10.0000 mg | INTRAMUSCULAR | Status: AC | PRN

## 2023-11-30 MED ORDER — FUROSEMIDE 40 MG PO TABS
40.0000 mg | ORAL_TABLET | Freq: Every day | ORAL | Status: DC
  Administered 2023-11-30: 40 mg via ORAL
  Filled 2023-11-30: qty 1

## 2023-11-30 MED ORDER — SPIRONOLACTONE 25 MG PO TABS
25.0000 mg | ORAL_TABLET | Freq: Every day | ORAL | Status: DC
  Administered 2023-11-30: 25 mg via ORAL
  Filled 2023-11-30: qty 1

## 2023-11-30 MED ORDER — SODIUM CHLORIDE 0.9 % IV SOLN: INTRAVENOUS | Status: DC

## 2023-11-30 MED ORDER — FUROSEMIDE 40 MG PO TABS
40.0000 mg | ORAL_TABLET | Freq: Every day | ORAL | 0 refills | Status: AC
  Filled 2023-11-30: qty 30, 30d supply, fill #0

## 2023-11-30 MED ORDER — ASPIRIN 81 MG PO TBEC
81.0000 mg | DELAYED_RELEASE_TABLET | Freq: Every day | ORAL | 12 refills | Status: DC
  Filled 2023-11-30: qty 30, 30d supply, fill #0

## 2023-11-30 MED ORDER — HEPARIN (PORCINE) IN NACL 1000-0.9 UT/500ML-% IV SOLN
INTRAVENOUS | Status: DC | PRN
  Administered 2023-11-30: 1000 mL

## 2023-11-30 MED ORDER — TICAGRELOR 90 MG PO TABS
180.0000 mg | ORAL_TABLET | Freq: Once | ORAL | Status: AC
  Administered 2023-11-30: 180 mg via ORAL
  Filled 2023-11-30: qty 2

## 2023-11-30 MED ORDER — HEPARIN (PORCINE) 25000 UT/250ML-% IV SOLN
1450.0000 [IU]/h | INTRAVENOUS | 0 refills | Status: DC
  Filled 2023-11-30: qty 10, fill #0

## 2023-11-30 MED ORDER — TICAGRELOR 90 MG PO TABS
90.0000 mg | ORAL_TABLET | Freq: Two times a day (BID) | ORAL | 0 refills | Status: DC
  Filled 2023-11-30: qty 60, 30d supply, fill #0

## 2023-11-30 MED ORDER — SODIUM CHLORIDE 0.9% FLUSH
3.0000 mL | Freq: Two times a day (BID) | INTRAVENOUS | Status: DC
Start: 2023-11-30 — End: 2023-12-01
  Administered 2023-11-30: 3 mL via INTRAVENOUS

## 2023-11-30 MED ORDER — MIDAZOLAM HCL 2 MG/2ML IJ SOLN
INTRAMUSCULAR | Status: AC
  Filled 2023-11-30: qty 2

## 2023-11-30 MED ORDER — VERAPAMIL HCL 2.5 MG/ML IV SOLN
INTRAVENOUS | Status: DC | PRN
  Administered 2023-11-30: 2.5 mg via INTRA_ARTERIAL

## 2023-11-30 MED ORDER — VERAPAMIL HCL 2.5 MG/ML IV SOLN
INTRAVENOUS | Status: AC
  Filled 2023-11-30: qty 2

## 2023-11-30 MED ORDER — FENTANYL CITRATE (PF) 100 MCG/2ML IJ SOLN
INTRAMUSCULAR | Status: AC
  Filled 2023-11-30: qty 2

## 2023-11-30 MED ORDER — FREE WATER
250.0000 mL | Freq: Once | Status: AC
  Administered 2023-11-30: 250 mL via ORAL

## 2023-11-30 MED ORDER — LIDOCAINE HCL (PF) 1 % IJ SOLN
INTRAMUSCULAR | Status: DC | PRN
  Administered 2023-11-30: 2 mL

## 2023-11-30 MED ORDER — FENTANYL CITRATE (PF) 100 MCG/2ML IJ SOLN
INTRAMUSCULAR | Status: DC | PRN
  Administered 2023-11-30: 25 ug via INTRAVENOUS

## 2023-11-30 MED ORDER — IOHEXOL 300 MG/ML  SOLN
INTRAMUSCULAR | Status: DC | PRN
  Administered 2023-11-30: 165 mL

## 2023-11-30 MED ORDER — MIDAZOLAM HCL 2 MG/2ML IJ SOLN
INTRAMUSCULAR | Status: DC | PRN
  Administered 2023-11-30: 1 mg via INTRAVENOUS

## 2023-11-30 MED ORDER — HEPARIN (PORCINE) 25000 UT/250ML-% IV SOLN
1450.0000 [IU]/h | INTRAVENOUS | Status: DC
Start: 2023-11-30 — End: 2023-12-01
  Administered 2023-11-30: 1450 [IU]/h via INTRAVENOUS
  Filled 2023-11-30: qty 250

## 2023-11-30 MED ORDER — SODIUM CHLORIDE 0.9% FLUSH: 3.0000 mL | INTRAVENOUS | Status: DC | PRN

## 2023-11-30 MED ORDER — TICAGRELOR 90 MG PO TABS
90.0000 mg | ORAL_TABLET | Freq: Two times a day (BID) | ORAL | Status: DC
Start: 2023-12-01 — End: 2023-12-01

## 2023-11-30 MED ORDER — ATORVASTATIN CALCIUM 40 MG PO TABS
40.0000 mg | ORAL_TABLET | Freq: Every day | ORAL | 1 refills | Status: DC
  Filled 2023-11-30: qty 30, 30d supply, fill #0

## 2023-11-30 MED ORDER — SODIUM CHLORIDE 0.9 % IV SOLN: 250.0000 mL | INTRAVENOUS | Status: DC | PRN

## 2023-11-30 MED ORDER — HEPARIN SODIUM (PORCINE) 1000 UNIT/ML IJ SOLN
INTRAMUSCULAR | Status: DC | PRN
  Administered 2023-11-30: 5000 [IU] via INTRAVENOUS

## 2023-11-30 MED ORDER — LIDOCAINE HCL 1 % IJ SOLN
INTRAMUSCULAR | Status: AC
  Filled 2023-11-30: qty 20

## 2023-11-30 MED ORDER — DAPAGLIFLOZIN PROPANEDIOL 10 MG PO TABS
10.0000 mg | ORAL_TABLET | Freq: Every day | ORAL | Status: DC
  Administered 2023-11-30: 10 mg via ORAL
  Filled 2023-11-30: qty 1

## 2023-11-30 SURGICAL SUPPLY — 11 items
CATH INFINITI 5 FR JL3.5 (CATHETERS) IMPLANT
CATH INFINITI JR4 5F (CATHETERS) IMPLANT
DEVICE RAD COMP TR BAND LRG (VASCULAR PRODUCTS) IMPLANT
DEVICE RAD TR BAND REGULAR (VASCULAR PRODUCTS) IMPLANT
DRAPE BRACHIAL (DRAPES) IMPLANT
GLIDESHEATH SLEND SS 6F .021 (SHEATH) IMPLANT
GUIDEWIRE INQWIRE 1.5J.035X260 (WIRE) IMPLANT
KIT SYRINGE INJ CVI SPIKEX1 (MISCELLANEOUS) IMPLANT
PACK CARDIAC CATH (CUSTOM PROCEDURE TRAY) ×1 IMPLANT
SET ATX-X65L (MISCELLANEOUS) IMPLANT
STATION PROTECTION PRESSURIZED (MISCELLANEOUS) IMPLANT

## 2023-11-30 NOTE — Progress Notes (Addendum)
 [facility] CLINIC CARDIOLOGY PROGRESS NOTE       Patient ID: Ronald Malone MRN: [id number] DOB/AGE: 1955-09-04 68 y.o.  Admit date: 11/29/2023 Referring Physician Dr. Dorothyann Primary Physician Gasper, Nancyann BRAVO, MD Primary Cardiologist Dr. Florencio Reason for Consultation elevated troponin  HPI: Ronald Malone is a 68 y.o. 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 Cardiology was consulted for further evaluation.   Interval History: -Patient seen and examined this AM and sitting comfortably in bedside chair. Patient states he feels well this AM and denies any recurrence of CP, Denies SOB.  -Patients BP and HR  stable this AM. Overnight Tele showed no significant events.  -Patient remains on room air with stable SpO2.  -Plan for LHC today at 11:30 AM. Patient has remained NPO.   Past Cardiac Imaging/Results cMRI (12/08/2022) -Mildly reduced LV systolic function (LVEF 48%) -Basal-mid inferior subendocardial LGE/scar -Normal RV size and systolic function -Findings suggest prior basal-mid inferior LV infarct  Stress test Recent Myoview  stress (10/12/22), LV perfusion is abnormal. There is no evidence of ischemia.   Review of systems complete and found to be negative unless listed above    Past Medical History:  Diagnosis Date   Cataract 9/22   CHF (congestive heart failure) (HCC)    Clotting disorder (HCC)    COPD (chronic obstructive pulmonary disease) (HCC)    Diabetes mellitus without complication (HCC)    Hypertension    MI (myocardial infarction) (HCC) 2015   Peripheral neuropathy    Pulmonary embolism (HCC) 2020   Sleep apnea    CPAP   Torn meniscus    right   Wears dentures    full upper, partial lower   Wears hearing aid in right ear      Past Surgical History:  Procedure Laterality Date   CATARACT EXTRACTION W/PHACO Right 01/13/2022   Procedure: CATARACT EXTRACTION PHACO AND INTRAOCULAR LENS PLACEMENT (IOC) RIGHT 9.56 01:34.8;  Surgeon: Mittie Gaskin, MD;  Location: [facility];  Service: Ophthalmology;  Laterality: Right;  Diabetic   CATARACT EXTRACTION W/PHACO Left 01/27/2022   Procedure: CATARACT EXTRACTION PHACO AND INTRAOCULAR LENS PLACEMENT (IOC) LEFT 3.42  00:52.2;  Surgeon: Mittie Gaskin, MD;  Location: [facility];  Service: Ophthalmology;  Laterality: Left;  Diabetic   COLON SURGERY     1/3 colon removed as an infant   COLONOSCOPY WITH PROPOFOL  N/A 12/26/2018   Procedure: COLONOSCOPY WITH PROPOFOL ;  Surgeon: Jinny Carmine, MD;  Location: [facility];  Service: Endoscopy;  Laterality: N/A;   COLONOSCOPY WITH PROPOFOL  N/A 04/01/2020   Procedure: COLONOSCOPY WITH PROPOFOL ;  Surgeon: Jinny Carmine, MD;  Location: [facility];  Service: Endoscopy;  Laterality: N/A;   CORONARY ANGIOPLASTY WITH STENT PLACEMENT  2015   DES STENT POST MI, [place]    LOWER EXTREMITY ANGIOGRAPHY Right 10/23/2018   Procedure: LOWER EXTREMITY ANGIOGRAPHY;  Surgeon: Marea Selinda RAMAN, MD;  Location: [facility];  Service: Cardiovascular;  Laterality: Right;   SMALL INTESTINE SURGERY  1955    Medications Prior to Admission  Medication Sig Dispense Refill Last Dose/Taking   apixaban  (ELIQUIS ) 2.5 MG TABS tablet Take 1 tablet (2.5 mg total) by mouth 2 (two) times daily. 180 tablet 4 11/28/2023 at 10:00 PM   atorvastatin  (LIPITOR) 20 MG tablet Take 20 mg  by mouth daily.   11/28/2023 Evening   azelastine (ASTELIN) 0.1 % nasal spray Place into both nostrils 2 (two) times daily. Use in each nostril as directed   11/28/2023   COMBIVENT RESPIMAT 20-100 MCG/ACT AERS respimat Inhale 1 puff into the lungs every 6 (six) hours as needed.   Past Week   losartan  (COZAAR ) 100 MG tablet Take 100 mg by mouth daily.    11/28/2023   metFORMIN  (GLUCOPHAGE -XR) 500 MG 24 hr tablet TAKE 2 TABLETS BY MOUTH TWICE A DAY 360 tablet 4 11/28/2023 Evening   metoprolol  succinate (TOPROL -XL) 50 MG 24 hr tablet Take 1 tablet by mouth daily.   11/28/2023   nitroGLYCERIN  (NITROSTAT ) 0.4 MG SL tablet Place 1 tablet (0.4 mg total) under the tongue every 5 (five) minutes as needed for chest pain. 100 tablet 1 11/29/2023 Morning   Semaglutide ,0.25 or 0.5MG /DOS, (OZEMPIC , 0.25 OR 0.5 MG/DOSE,) 2 MG/3ML SOPN Start 0.25mg  once a week for 4 weeks, then increase to 0.5mg  once a week 3 mL 5 11/23/2023   Blood Glucose Monitoring Suppl (ACCU-CHEK GUIDE) w/Device KIT Use to check blood sugar daily for type 2 diabetes E11.9 1 kit 0    dapagliflozin  propanediol (FARXIGA ) 10 MG TABS tablet Take 10 mg by mouth daily.      glucose blood (ACCU-CHEK GUIDE TEST) test strip Use as instructed to check blood sugar daily for type 2 diabetes E11.9 100 each 12    spironolactone  (ALDACTONE ) 25 MG tablet Take 25 mg by mouth daily.      Social History   Socioeconomic History   Marital status: Married    Spouse name: Arland    Number of children: 2   Years of education: Not on file   Highest education level: Bachelor's degree (e.g., BA, AB, BS)  Occupational History   Occupation: RETIRED   Tobacco Use   Smoking status: Every Day    Current packs/day: 2.00    Average packs/day: 2.0 packs/day for 47.7 years (95.4 ttl pk-yrs)    Types: Cigarettes    Start date: 2   Smokeless tobacco: Never  Vaping Use   Vaping status: Never Used  Substance and Sexual Activity   Alcohol use: Yes    Alcohol/week: 2.0 standard drinks of alcohol    Types: 2 Cans of beer per week    Comment: once a month   Drug use: Yes    Types: Marijuana    Comment: occasional   Sexual activity: Not Currently    Birth control/protection: None  Other Topics Concern   Not on file  Social History Narrative   Not on file   Social Drivers of Health   Financial Resource Strain:  Low Risk  (11/18/2023)   Received from [facility]   Overall Financial Resource Strain (CARDIA)    Difficulty of Paying Living Expenses: Not hard at all  Food Insecurity: No Food Insecurity (11/29/2023)   Hunger Vital Sign    Worried About Running Out of Food in the Last Year: Never true    Ran Out of Food in the Last Year: Never true  Transportation Needs: No Transportation Needs (11/29/2023)   PRAPARE - Administrator, Civil Service (Medical): No    Lack of Transportation (Non-Medical): No  Physical Activity: Insufficiently Active (10/27/2023)   Exercise Vital Sign    Days of Exercise per Week: 1 day    Minutes of Exercise per Session: 90 min  Stress: No Stress Concern Present (10/27/2023)  [employer] of Occupational Health - Occupational Stress Questionnaire    Feeling of Stress: Not at all  Social Connections: Socially Isolated (11/29/2023)   Social Connection and Isolation Panel    Frequency of Communication with Friends and Family: Never    Frequency of Social Gatherings with Friends and Family: Once a week    Attends Religious Services: Never    Database administrator or Organizations: Not on file    Attends Banker Meetings: Never    Marital Status: Married  Catering manager Violence: Not At Risk (11/29/2023)   Humiliation, Afraid, Rape, and Kick questionnaire    Fear of Current or Ex-Partner: No    Emotionally Abused: No    Physically Abused: No    Sexually Abused: No    Family History  Problem Relation Age of Onset   Parkinson's disease Mother    Dementia Father      Vitals:   11/30/23 0500 11/30/23 0600 11/30/23 0749 11/30/23 1058  BP: 138/71  127/85 (!) 154/76  Pulse: 63  65 72  Resp: (!) 22  17 20   Temp: 98.4 F (36.9 C)  98.2 F (36.8 C) (!) 97.3 F (36.3 C)  TempSrc:    Temporal  SpO2: 94%  94% 96%  Weight:  135.7 kg    Height:        PHYSICAL EXAM General: Chronically ill-appearing elderly male, well  nourished, in no acute distress. HEENT: Normocephalic and atraumatic. Neck: No JVD.   Lungs: Normal respiratory effort on room air. Clear bilaterally to auscultation. No wheezes, crackles, rhonchi.  Heart: HRRR. Normal S1 and S2 without gallops or murmurs.  Abdomen: Non-distended appearing.  Msk: Normal strength and tone for age. Extremities: Warm and well perfused. No clubbing, cyanosis, edema.  Neuro: Alert and oriented X 3. Psych: Answers questions appropriately.   Labs: Basic Metabolic Panel: Recent Labs    11/29/23 0730 11/30/23 0416  NA 136 137  K 4.3 4.2  CL 101 104  CO2 24 25  GLUCOSE 128* 129*  BUN 14 16  CREATININE 0.91 0.94  CALCIUM  9.1 8.7*   Liver Function Tests: Recent Labs    11/30/23 0416  AST 25  ALT 20  ALKPHOS 61  BILITOT 0.6  PROT 6.8  ALBUMIN 3.4*   No results for input(s): LIPASE, AMYLASE in the last 72 hours. CBC: Recent Labs    11/29/23 0730 11/30/23 0416  WBC 10.8* 8.6  HGB 15.0 14.0  HCT 46.1 43.2  MCV 97.3 96.2  PLT 245 204   Cardiac Enzymes: Recent Labs    11/29/23 0730 11/29/23 0936  TROPONINIHS 2,953* 2,747*   BNP: No results for input(s): BNP in the last 72 hours. D-Dimer: No results for input(s): DDIMER in the last 72 hours. Hemoglobin A1C: No results for input(s): HGBA1C in the last 72 hours. Fasting Lipid Panel: No results for input(s): CHOL, HDL, LDLCALC, TRIG, CHOLHDL, LDLDIRECT in the last 72 hours. Thyroid  Function Tests: No results for input(s): TSH, T4TOTAL, T3FREE, THYROIDAB in the last 72 hours.  Invalid input(s): FREET3 Anemia Panel: No results for input(s): VITAMINB12, FOLATE, FERRITIN, TIBC, IRON, RETICCTPCT in the last 72 hours.   Radiology: ECHOCARDIOGRAM COMPLETE Result Date: 11/30/2023    ECHOCARDIOGRAM REPORT   Patient Name:   Ronald Malone Date of Exam: 11/29/2023 Medical Rec #:  [id number]        Height:       72.0 in Accession #:    [id number]  Weight:       320.1 lb Date of Birth:  08/29/1955        BSA:          2.602 m Patient Age:    68 years         BP:           134/57 mmHg Patient Gender: M                HR:           54 bpm. Exam Location:  [facility] Procedure: 2D Echo, Cardiac Doppler, Color Doppler and Intracardiac            Opacification Agent (Both Spectral and Color Flow Doppler were            utilized during procedure). Indications:     Chest Pain R07.9  History:         Patient has prior history of Echocardiogram examinations, most                  recent 09/01/2023. Signs/Symptoms:Chest Pain.  Sonographer:     Ashley McNeely-Sloane Referring Phys:  [id number] Lain Tetterton Diagnosing Phys: Sabina Custovic IMPRESSIONS  1. Left ventricular ejection fraction, by estimation, is 35 to 40%. The left ventricle has moderately decreased function. The left ventricle demonstrates regional wall motion abnormalities (see scoring diagram/findings for description). Left ventricular  diastolic parameters are consistent with Grade I diastolic dysfunction (impaired relaxation).  2. Right ventricular systolic function is normal. The right ventricular size is normal.  3. The mitral valve is normal in structure. Mild mitral valve regurgitation.  4. The aortic valve is normal in structure. Aortic valve regurgitation is not visualized. No aortic stenosis is present.  5. The inferior vena cava is normal in size with greater than 50% respiratory variability, suggesting right atrial pressure of 3 mmHg. FINDINGS  Left Ventricle: Left ventricular ejection fraction, by estimation, is 35 to 40%. The left ventricle has moderately decreased function. The left ventricle demonstrates regional wall motion abnormalities. Definity  contrast agent was given IV to delineate the left ventricular endocardial borders. The left ventricular internal cavity size was normal in size. There is no left ventricular hypertrophy. Left ventricular diastolic parameters are consistent with Grade I  diastolic dysfunction (impaired relaxation). The ratio of pulmonic flow to systemic flow (Qp/Qs ratio) is 2.90.  LV Wall Scoring: The inferior septum is hypokinetic. Right Ventricle: The right ventricular size is normal. No increase in right ventricular wall thickness. Right ventricular systolic function is normal. Left Atrium: Left atrial size was normal in size. Right Atrium: Right atrial size was normal in size. Pericardium: There is no evidence of pericardial effusion. Mitral Valve: The mitral valve is normal in structure. Mild mitral valve regurgitation. MV peak gradient, 4.5 mmHg. The mean mitral valve gradient is 2.0 mmHg. Tricuspid Valve: The tricuspid valve is normal in structure. Tricuspid valve regurgitation is trivial. Aortic Valve: The aortic valve is normal in structure. Aortic valve regurgitation is not visualized. No aortic stenosis is present. Aortic valve mean gradient measures 2.0 mmHg. Aortic valve peak gradient measures 4.6 mmHg. Aortic valve area, by VTI measures 4.37 cm. Pulmonic Valve: The pulmonic valve was normal in structure. Pulmonic valve regurgitation is not visualized. Aorta: The aortic root is normal in size and structure. Venous: The inferior vena cava is normal in size with greater than 50% respiratory variability, suggesting right atrial pressure of 3 mmHg. IAS/Shunts: No atrial level shunt detected by color flow Doppler.  The ratio of pulmonic flow to systemic flow (Qp/Qs ratio) is 2.90.  LEFT VENTRICLE PLAX 2D LVIDd:         5.37 cm      Diastology LVIDs:         4.67 cm      LV e' medial:    5.77 cm/s LV PW:         1.43 cm      LV E/e' medial:  14.0 LV IVS:        1.50 cm      LV e' lateral:   7.07 cm/s LVOT diam:     2.50 cm      LV E/e' lateral: 11.4 LV SV:         96 LV SV Index:   37 LVOT Area:     4.91 cm  LV Volumes (MOD) LV vol d, MOD A2C: 120.0 ml LV vol d, MOD A4C: 134.0 ml LV vol s, MOD A2C: 57.1 ml LV vol s, MOD A4C: 107.0 ml LV SV MOD A2C:     62.9 ml LV SV MOD  A4C:     134.0 ml LV SV MOD BP:      40.4 ml RIGHT VENTRICLE RV Basal diam:  6.00 cm RV Mid diam:    3.80 cm RV S prime:     17.20 cm/s RVOT diam:      4.60 cm TAPSE (M-mode): 3.6 cm LEFT ATRIUM             Index        RIGHT ATRIUM           Index LA diam:        4.60 cm 1.77 cm/m   RA Area:     22.20 cm LA Vol (A2C):   74.8 ml 28.75 ml/m  RA Volume:   69.90 ml  26.86 ml/m LA Vol (A4C):   68.9 ml 26.48 ml/m LA Biplane Vol: 73.6 ml 28.29 ml/m  AORTIC VALVE                    PULMONIC VALVE AV Area (Vmax):    4.29 cm     PV Area (Vmax):  14.07 cm AV Area (Vmean):   4.19 cm     PV Area (Vmean): 13.97 cm AV Area (VTI):     4.37 cm     PV Area (VTI):   11.93 cm AV Vmax:           107.00 cm/s  PV Vmax:         1.12 m/s AV Vmean:          70.600 cm/s  PV Vmean:        73.300 cm/s AV VTI:            0.220 m      PV VTI:          0.234 m AV Peak Grad:      4.6 mmHg     PV Peak grad:    5.0 mmHg AV Mean Grad:      2.0 mmHg     PV Mean grad:    3.0 mmHg LVOT Vmax:         93.60 cm/s   RVOT Peak grad:  4 mmHg LVOT Vmean:        60.200 cm/s LVOT VTI:          0.196 m LVOT/AV VTI ratio: 0.89  AORTA Ao Root diam:  3.50 cm MITRAL VALVE MV Area (PHT): 3.21 cm     SHUNTS MV Area VTI:   2.55 cm     Systemic VTI:  0.20 m MV Peak grad:  4.5 mmHg     Systemic Diam: 2.50 cm MV Mean grad:  2.0 mmHg     Pulmonic VTI:  0.168 m MV Vmax:       1.06 m/s     Pulmonic Diam: 4.60 cm MV Vmean:      65.2 cm/s    Qp/Qs:         2.90 MV Decel Time: 236 msec MV E velocity: 80.50 cm/s MV A velocity: 111.00 cm/s MV E/A ratio:  0.73 Sabina Custovic Electronically signed by Annalee Casa Signature Date/Time: 11/30/2023/12:17:33 PM    Final    DG Chest 2 View Result Date: 11/29/2023 CLINICAL DATA:  Chest pain EXAM: CHEST - 2 VIEW COMPARISON:  None Available. FINDINGS: The lungs are clear without focal pneumonia, edema, pneumothorax or pleural effusion. Hazy opacity in the medial right base stable since scout image for CT 07/30/2022 and  compatible with fat pad. The cardiopericardial silhouette is within normal limits for size. No acute bony abnormality. IMPRESSION: No active cardiopulmonary disease. Electronically Signed   By: Camellia Candle M.D.   On: 11/29/2023 07:57    ECHO as above..  TELEMETRY reviewed by me 11/30/2023: Sinus rhythm, rate 90s  EKG reviewed by me: sinus rhythm, 58 bpm with no acute ischemic changes  Data reviewed by me 11/30/2023: last 24h vitals tele labs imaging I/O hospitalist progress notes.  Principal Problem:   NSTEMI (non-ST elevated myocardial infarction) (HCC) Active Problems:   History of pulmonary embolism   History of tobacco abuse   History of MI (myocardial infarction)   Coronary artery disease   Type 2 diabetes mellitus with hyperglycemia, without long-term current use of insulin  (HCC)   OSA on CPAP   Heart failure with mildly reduced ejection fraction (HCC)    ASSESSMENT AND PLAN:  ZEPH RIEBEL is a 68 y.o. 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 Cardiology was consulted for further evaluation.   # NSTEMI # Coronary artery disease s/p hx stent # Hypertension # Hyperlipidemia Patient presents with chest pain. States feels the same as when he needed LHC with stent over 10 years ago. Troponins elevated at 2900 > 2700.  EKG with sinus rhythm, 58 bpm with no acute ischemic changes. Echo this admission + RWMA, hypokinetic inferior septum. Patient underwent LHC with Dr. Florencio has severe coronary artery disease with blockage in his prox LAD and LCx, sent images to [facility] for further management.  - Resume heparin  infusion 4 hour after TR band is off.  - S/p ASA 324 mg. Continue ASA 81 mg daily.  - Ordered Brilinta  load 180 mg today, thenn 90 mg BID tomorrow.  -  Continue atorvastatin  to 40 mg daily.   - Continue losartan  100 mg daily.  - Continue metoprolol  succinate 50 mg daily.  - LHC images sent to [facility] for further recommendations of management. Plan for [facility] transfer, accepting physician is Dr. Harlene Gill.   # Chronic HFmrEF (Newly reduced EF this admission 35-40%) Echo this admission with newly reduced EF 35-40%. (Prior EF from 2024, EF 45%). Grade I diastolic dysfunction, mild MR.  LHC (09/17) with elevated EDP 25.  - Ordered PO lasix  40 mg daily.   - Continue dapagliflozin  10 mg daily. - Continue spironolactone  25 mg daily. - Continue losartan  and metoprolol  stated above.   This patient's plan of care was discussed and created with Dr. Custovic and she is in agreement.  Signed: Dorene Comfort, PA-C  11/30/2023, 12:27 PM [facility] Cardiology

## 2023-11-30 NOTE — TOC CM/SW Note (Signed)
 Transition of Care [facility]) - Inpatient Brief Assessment   Patient Details  Name: Ronald Malone MRN: [id number] Date of Birth: 1956/01/14  Transition of Care [facility] CM/SW Contact:    Lauraine JAYSON Carpen, LCSW Phone Number: 11/30/2023, 3:30 PM   Clinical Narrative: Patient has orders to transfer to [facility]. CSW reviewed chart. No TOC needs identified at this time. CSW signing off.  Transition of Care Asessment: Insurance and Status: Insurance coverage has been reviewed Patient has primary care physician: Yes Home environment has been reviewed: Single family home Prior level of function:: Not documented Prior/Current Home Services: No current home services Social Drivers of Health Review: SDOH reviewed interventions complete Readmission risk has been reviewed: Yes Transition of care needs: no transition of care needs at this time

## 2023-11-30 NOTE — Plan of Care (Signed)
  Problem: Education: Goal: Ability to describe self-care measures that may prevent or decrease complications (Diabetes Survival Skills Education) will improve Outcome: Progressing Goal: Individualized Educational Video(s) Outcome: Progressing   Problem: Coping: Goal: Ability to adjust to condition or change in health will improve Outcome: Progressing   Problem: Fluid Volume: Goal: Ability to maintain a balanced intake and output will improve Outcome: Progressing   Problem: Health Behavior/Discharge Planning: Goal: Ability to identify and utilize available resources and services will improve Outcome: Progressing Goal: Ability to manage health-related needs will improve Outcome: Progressing   Problem: Skin Integrity: Goal: Risk for impaired skin integrity will decrease Outcome: Progressing   Problem: Tissue Perfusion: Goal: Adequacy of tissue perfusion will improve Outcome: Progressing   Problem: Education: Goal: Understanding of cardiac disease, CV risk reduction, and recovery process will improve Outcome: Progressing Goal: Individualized Educational Video(s) Outcome: Progressing   Problem: Health Behavior/Discharge Planning: Goal: Ability to safely manage health-related needs after discharge will improve Outcome: Progressing   Problem: Cardiac: Goal: Ability to achieve and maintain adequate cardiovascular perfusion will improve Outcome: Progressing   Problem: Clinical Measurements: Goal: Ability to maintain clinical measurements within normal limits will improve Outcome: Progressing Goal: Will remain free from infection Outcome: Progressing Goal: Diagnostic test results will improve Outcome: Progressing Goal: Respiratory complications will improve Outcome: Progressing Goal: Cardiovascular complication will be avoided Outcome: Progressing

## 2023-11-30 NOTE — Discharge Summary (Addendum)
 Physician Discharge Summary   Patient: Ronald Malone MRN: [id number] DOB: May 26, 1955  Admit date:     11/29/2023  Discharge date: 11/30/23  Discharge Physician: Leita Blanch   PCP: Ronald Nancyann BRAVO, MD   Recommendations at discharge:   patient to follow-up [email or web address] once discharge from [facility] follow-up PCP in two weeks   Discharge Diagnoses: Principal Problem:   NSTEMI (non-ST elevated myocardial infarction) [facility]) Active Problems:   History of pulmonary embolism   History of tobacco abuse   History of MI (myocardial infarction)   Coronary artery disease   Type 2 diabetes mellitus with hyperglycemia, without long-term current use of insulin  (HCC)   OSA on CPAP   Heart failure with mildly reduced ejection fraction (HCC)   Ronald Malone is a pleasant 68 y.o. male with medical history significant for CHF, COPD, obstructive sleep apnea on CPAP, DM, HTN, history of MI 10 years ago s/p stent, history of PE on Eliquis  who presented to ED at [facility] for chest pain that started yesterday.  According to the patient, he had chest pain that started 24 hours ago, which was 7/10 in intensity, pressure-like, retrosternal, nonradiating.    Acute coronary syndrome/acute non-ST elevation myocardial infarction Ischemic CMP EF 35-40% by echo - Patient will be admitted to hospital as inpatient - Received aspirin  324 mg in the ED -  started on heparin  drip - [facility] consulted --9/17--LHC shows severe 3 vessel CAD--recommends transfer to [facility]. Accepting MD Dr Harlene Gill --troponin [phone] -- Per Dr. Florencio patient to continue IV heparin  drip on transfer along with cardiac meds -- patient has been loaded with Brilinta  180 mg once and continue 90 mg BID --started on lasix  by [facility] cards --cont farxiga ,losartan , BB, prn nitroglycerin , spironolactone     Diabetes-2, uncontrolled --Continue on insulin  sliding scale --metformin  held due to cath-- resume when appropriate    Obstructive sleep apnea on CPAP morbid obesity BMI 40.57 - Resume home CPAP    History of congestive heart failure -resume cardiac meds  History of PE on Eliquis  -- Patient is already on heparin  drip -- eliquis  on hold at present   Chronic tobacco use disorder - Extensive counseling was done   patient will discharge to [facility] once bed available.     Consultants: [facility] cardiology Procedures performed: left heart cath Disposition: [facility] Diet recommendation:  Discharge Diet Orders (From admission, onward)     Start     Ordered   11/30/23 0000  Diet - low sodium heart healthy        11/30/23 1522           Cardiac and Carb modified diet DISCHARGE MEDICATION: Allergies as of 11/30/2023   No Known Allergies      Medication List     PAUSE taking these medications    apixaban  2.5 MG Tabs tablet Wait to take this until your doctor or other care provider tells you to start again. Commonly known as: Eliquis  Take 1 tablet (2.5 mg total) by mouth 2 (two) times daily.   metFORMIN  500 MG 24 hr tablet Wait to take this until your doctor or other care provider tells you to start again. Commonly known as: GLUCOPHAGE -XR TAKE 2 TABLETS BY MOUTH TWICE A DAY       TAKE these medications    Accu-Chek Guide Test test strip Generic drug: glucose blood Use as instructed to check blood sugar daily for type 2 diabetes E11.9   Accu-Chek Guide w/Device Kit  Use to check blood sugar daily for type 2 diabetes E11.9   aspirin  EC 81 MG tablet Take 1 tablet (81 mg total) by mouth daily. Swallow whole. Start taking on: December 01, 2023   atorvastatin  40 MG tablet Commonly known as: LIPITOR Take 1 tablet (40 mg total) by mouth daily. What changed:  medication strength how much to take   azelastine 0.1 % nasal spray Commonly known as: ASTELIN Place into both nostrils 2 (two) times daily. Use in each nostril as directed   Combivent Respimat 20-100  MCG/ACT Aers respimat Generic drug: Ipratropium-Albuterol Inhale 1 puff into the lungs every 6 (six) hours as needed.   Farxiga  10 MG Tabs tablet Generic drug: dapagliflozin  propanediol Take 10 mg by mouth daily.   furosemide  40 MG tablet Commonly known as: LASIX  Take 1 tablet (40 mg total) by mouth daily.   heparin  25000 UT/250ML infusion Inject 1,450 Units/hr into the vein continuous.   losartan  100 MG tablet Commonly known as: COZAAR  Take 100 mg by mouth daily.   metoprolol  succinate 50 MG 24 hr tablet Commonly known as: TOPROL -XL Take 1 tablet by mouth daily.   nitroGLYCERIN  0.4 MG SL tablet Commonly known as: NITROSTAT  Place 1 tablet (0.4 mg total) under the tongue every 5 (five) minutes as needed for chest pain.   Ozempic  (0.25 or 0.5 MG/DOSE) 2 MG/3ML Sopn Generic drug: Semaglutide (0.25 or 0.5MG /DOS) Start 0.25mg  once a week for 4 weeks, then increase to 0.5mg  once a week   polyethylene glycol 17 g packet Commonly known as: MIRALAX  / GLYCOLAX  Take 17 g by mouth daily as needed for mild constipation.   spironolactone  25 MG tablet Commonly known as: ALDACTONE  Take 25 mg by mouth daily.   ticagrelor  90 MG Tabs tablet Commonly known as: BRILINTA  Take 1 tablet (90 mg total) by mouth 2 (two) times daily. Start taking on: December 01, 2023        Follow-up Information     Florencio Cara BIRCH, MD. Go on 12/14/2023.   Specialties: Cardiology, Internal Medicine Why: 10/01 at [facility] information: [address] [place] [address] [phone]         Ronald Nancyann BRAVO, MD. Schedule an appointment as soon as possible for a visit in 2 week(s).   Specialty: Family Medicine Why: hospital f/u Contact information: [address] [place] [address] [phone]                Discharge Exam: Ronald Malone   11/29/23 0759 11/30/23 0600  Weight: (!) 145.2 kg 135.7 kg   Patient is awake alert oriented times three, morbidly  obese respiratory clear auscultation decreased breath sounds bases cardiovascular both heart sounds normal abdomen soft nontender. Abdominal obesity neuro- grossly intact  Condition at discharge: fair  The results of significant diagnostics from this hospitalization (including imaging, microbiology, ancillary and laboratory) are listed below for reference.   Imaging Studies: ECHOCARDIOGRAM COMPLETE Result Date: 11/30/2023    ECHOCARDIOGRAM REPORT   Patient Name:   BANJAMIN STOVALL Date of Exam: 11/29/2023 Medical Rec #:  [id number]        Height:       72.0 in Accession #:    [id number]       Weight:       320.1 lb Date of Birth:  August 21, 1955        BSA:          2.602 m Patient Age:    41 years  BP:           134/57 mmHg Patient Gender: M                HR:           54 bpm. Exam Location:  [facility] Procedure: 2D Echo, Cardiac Doppler, Color Doppler and Intracardiac            Opacification Agent (Both Spectral and Color Flow Doppler were            utilized during procedure). Indications:     Chest Pain R07.9  History:         Patient has prior history of Echocardiogram examinations, most                  recent 09/01/2023. Signs/Symptoms:Chest Pain.  Sonographer:     Ashley McNeely-Sloane Referring Phys:  [id number] GABRIELLA DECOSTE Diagnosing Phys: Sabina Custovic IMPRESSIONS  1. Left ventricular ejection fraction, by estimation, is 35 to 40%. The left ventricle has moderately decreased function. The left ventricle demonstrates regional wall motion abnormalities (see scoring diagram/findings for description). Left ventricular  diastolic parameters are consistent with Grade I diastolic dysfunction (impaired relaxation).  2. Right ventricular systolic function is normal. The right ventricular size is normal.  3. The mitral valve is normal in structure. Mild mitral valve regurgitation.  4. The aortic valve is normal in structure. Aortic valve regurgitation is not visualized. No aortic stenosis is present.   5. The inferior vena cava is normal in size with greater than 50% respiratory variability, suggesting right atrial pressure of 3 mmHg. FINDINGS  Left Ventricle: Left ventricular ejection fraction, by estimation, is 35 to 40%. The left ventricle has moderately decreased function. The left ventricle demonstrates regional wall motion abnormalities. Definity  contrast agent was given IV to delineate the left ventricular endocardial borders. The left ventricular internal cavity size was normal in size. There is no left ventricular hypertrophy. Left ventricular diastolic parameters are consistent with Grade I diastolic dysfunction (impaired relaxation). The ratio of pulmonic flow to systemic flow (Qp/Qs ratio) is 2.90.  LV Wall Scoring: The inferior septum is hypokinetic. Right Ventricle: The right ventricular size is normal. No increase in right ventricular wall thickness. Right ventricular systolic function is normal. Left Atrium: Left atrial size was normal in size. Right Atrium: Right atrial size was normal in size. Pericardium: There is no evidence of pericardial effusion. Mitral Valve: The mitral valve is normal in structure. Mild mitral valve regurgitation. MV peak gradient, 4.5 mmHg. The mean mitral valve gradient is 2.0 mmHg. Tricuspid Valve: The tricuspid valve is normal in structure. Tricuspid valve regurgitation is trivial. Aortic Valve: The aortic valve is normal in structure. Aortic valve regurgitation is not visualized. No aortic stenosis is present. Aortic valve mean gradient measures 2.0 mmHg. Aortic valve peak gradient measures 4.6 mmHg. Aortic valve area, by VTI measures 4.37 cm. Pulmonic Valve: The pulmonic valve was normal in structure. Pulmonic valve regurgitation is not visualized. Aorta: The aortic root is normal in size and structure. Venous: The inferior vena cava is normal in size with greater than 50% respiratory variability, suggesting right atrial pressure of 3 mmHg. IAS/Shunts: No atrial  level shunt detected by color flow Doppler. The ratio of pulmonic flow to systemic flow (Qp/Qs ratio) is 2.90.  LEFT VENTRICLE PLAX 2D LVIDd:         5.37 cm      Diastology LVIDs:         4.67 cm  LV e' medial:    5.77 cm/s LV PW:         1.43 cm      LV E/e' medial:  14.0 LV IVS:        1.50 cm      LV e' lateral:   7.07 cm/s LVOT diam:     2.50 cm      LV E/e' lateral: 11.4 LV SV:         96 LV SV Index:   37 LVOT Area:     4.91 cm  LV Volumes (MOD) LV vol d, MOD A2C: 120.0 ml LV vol d, MOD A4C: 134.0 ml LV vol s, MOD A2C: 57.1 ml LV vol s, MOD A4C: 107.0 ml LV SV MOD A2C:     62.9 ml LV SV MOD A4C:     134.0 ml LV SV MOD BP:      40.4 ml RIGHT VENTRICLE RV Basal diam:  6.00 cm RV Mid diam:    3.80 cm RV S prime:     17.20 cm/s RVOT diam:      4.60 cm TAPSE (M-mode): 3.6 cm LEFT ATRIUM             Index        RIGHT ATRIUM           Index LA diam:        4.60 cm 1.77 cm/m   RA Area:     22.20 cm LA Vol (A2C):   74.8 ml 28.75 ml/m  RA Volume:   69.90 ml  26.86 ml/m LA Vol (A4C):   68.9 ml 26.48 ml/m LA Biplane Vol: 73.6 ml 28.29 ml/m  AORTIC VALVE                    PULMONIC VALVE AV Area (Vmax):    4.29 cm     PV Area (Vmax):  14.07 cm AV Area (Vmean):   4.19 cm     PV Area (Vmean): 13.97 cm AV Area (VTI):     4.37 cm     PV Area (VTI):   11.93 cm AV Vmax:           107.00 cm/s  PV Vmax:         1.12 m/s AV Vmean:          70.600 cm/s  PV Vmean:        73.300 cm/s AV VTI:            0.220 m      PV VTI:          0.234 m AV Peak Grad:      4.6 mmHg     PV Peak grad:    5.0 mmHg AV Mean Grad:      2.0 mmHg     PV Mean grad:    3.0 mmHg LVOT Vmax:         93.60 cm/s   RVOT Peak grad:  4 mmHg LVOT Vmean:        60.200 cm/s LVOT VTI:          0.196 m LVOT/AV VTI ratio: 0.89  AORTA Ao Root diam: 3.50 cm MITRAL VALVE MV Area (PHT): 3.21 cm     SHUNTS MV Area VTI:   2.55 cm     Systemic VTI:  0.20 m MV Peak grad:  4.5 mmHg     Systemic Diam: 2.50 cm MV Mean grad:  2.0 mmHg  Pulmonic VTI:  0.168 m MV  Vmax:       1.06 m/s     Pulmonic Diam: 4.60 cm MV Vmean:      65.2 cm/s    Qp/Qs:         2.90 MV Decel Time: 236 msec MV E velocity: 80.50 cm/s MV A velocity: 111.00 cm/s MV E/A ratio:  0.73 Sabina Custovic Electronically signed by Annalee Casa Signature Date/Time: 11/30/2023/12:17:33 PM    Final    DG Chest 2 View Result Date: 11/29/2023 CLINICAL DATA:  Chest pain EXAM: CHEST - 2 VIEW COMPARISON:  None Available. FINDINGS: The lungs are clear without focal pneumonia, edema, pneumothorax or pleural effusion. Hazy opacity in the medial right base stable since scout image for CT 07/30/2022 and compatible with fat pad. The cardiopericardial silhouette is within normal limits for size. No acute bony abnormality. IMPRESSION: No active cardiopulmonary disease. Electronically Signed   By: Camellia Candle M.D.   On: 11/29/2023 07:57    Microbiology: Results for orders placed or performed during the hospital encounter of 03/28/20  SARS CORONAVIRUS 2 (TAT 6-24 HRS) Nasopharyngeal Nasopharyngeal Swab     Status: None   Collection Time: 03/28/20 11:32 AM   Specimen: Nasopharyngeal Swab  Result Value Ref Range Status   SARS Coronavirus 2 NEGATIVE NEGATIVE Final    Comment: (NOTE) SARS-CoV-2 target nucleic acids are NOT DETECTED.  The SARS-CoV-2 RNA is generally detectable in upper and lower respiratory specimens during the acute phase of infection. Negative results do not preclude SARS-CoV-2 infection, do not rule out co-infections with other pathogens, and should not be used as the sole basis for treatment or other patient management decisions. Negative results must be combined with clinical observations, patient history, and epidemiological information. The expected result is Negative.  Fact Sheet for Patients: HairSlick.no  Fact Sheet for Healthcare Providers: quierodirigir.com  This test is not yet approved or cleared by the [place]   FDA and  has been authorized for detection and/or diagnosis of SARS-CoV-2 by FDA under an Emergency Use Authorization (EUA). This EUA will remain  in effect (meaning this test can be used) for the duration of the COVID-19 declaration under Se ction 564(b)(1) of the Act, 21 U.S.C. section 360bbb-3(b)(1), unless the authorization is terminated or revoked sooner.  Performed at [facility], [address], [place], [place] [address]     Labs: CBC: Recent Labs  Lab 11/29/23 0730 11/30/23 0416  WBC 10.8* 8.6  HGB 15.0 14.0  HCT 46.1 43.2  MCV 97.3 96.2  PLT 245 204   Basic Metabolic Panel: Recent Labs  Lab 11/29/23 0730 11/30/23 0416  NA 136 137  K 4.3 4.2  CL 101 104  CO2 24 25  GLUCOSE 128* 129*  BUN 14 16  CREATININE 0.91 0.94  CALCIUM  9.1 8.7*   Liver Function Tests: Recent Labs  Lab 11/30/23 0416  AST 25  ALT 20  ALKPHOS 61  BILITOT 0.6  PROT 6.8  ALBUMIN 3.4*   CBG: Recent Labs  Lab 11/29/23 1221 11/29/23 1710 11/29/23 2211 11/30/23 0751 11/30/23 1108  GLUCAP 124* 155* 202* 134* 135*    Discharge time spent: greater than 30 minutes.  Signed: Leita Blanch, MD [facility] 11/30/2023

## 2023-11-30 NOTE — Plan of Care (Signed)

## 2023-11-30 NOTE — Progress Notes (Signed)
 Patient leaving with transport to [facility]. Vital signs stable. Packet sent with transport.

## 2023-11-30 NOTE — Consult Note (Signed)
 PHARMACY - ANTICOAGULATION CONSULT NOTE  Pharmacy Consult for heparin  infusion Indication: ACS/STEMI  No Known Allergies  Patient Measurements: Height: 6' (182.9 cm) Weight: 135.7 kg (299 lb 2.6 oz) IBW/kg (Calculated) : 77.6 HEPARIN  DW (KG): 111.5  Vital Signs: Temp: 98.4 F (36.9 C) (09/17 0500) BP: 138/71 (09/17 0500) Pulse Rate: 63 (09/17 0500)  Labs: Recent Labs    11/29/23 0730 11/29/23 0936 11/29/23 0936 11/29/23 1800 11/30/23 0004 11/30/23 0416  HGB 15.0  --   --   --   --  14.0  HCT 46.1  --   --   --   --  43.2  PLT 245  --   --   --   --  204  APTT  --  31  --   --   --   --   LABPROT  --  14.7  --   --   --  14.4  INR  --  1.1  --   --   --  1.1  HEPARINUNFRC  --  0.46   < > 0.40 0.35 0.34  CREATININE 0.91  --   --   --   --  0.94  TROPONINIHS 2,953* 2,747*  --   --   --   --    < > = values in this interval not displayed.    Estimated Creatinine Clearance: 104.3 mL/min (by C-G formula based on SCr of 0.94 mg/dL).   Medical History: Past Medical History:  Diagnosis Date   Cataract 9/22   CHF (congestive heart failure) (HCC)    Clotting disorder (HCC)    COPD (chronic obstructive pulmonary disease) (HCC)    Diabetes mellitus without complication (HCC)    Hypertension    MI (myocardial infarction) (HCC) 2015   Peripheral neuropathy    Pulmonary embolism (HCC) 2020   Sleep apnea    CPAP   Torn meniscus    right   Wears dentures    full upper, partial lower   Wears hearing aid in right ear     Medications:  Patient takes Eliquis  2.5 mg po BID for Hx of DVT and PAD.  Last dose 11/28/23 @ 2200.  Assessment: 68 yo male presented to ED with chest pain that began 11/28/23.  PMH includes PAD, PE/DVT, CHF, COPD, HTN, CAD, and DM.  In ED patient found to have elevated troponin.  Pharmacy consulted for initiation of heparin  infusion.  Baseline HL 0.46, aPTT 31, and PT/INR 14.7/1.1--> HL not elevated, can move forward monitoring via heparin  level  instead of aPTT  0916 1800 HL 0.4, therapeutic x 1 0917 0004 HL 0.35, therapeutic x 2 0917 0416 HL 0.34, therapeutic x 3  Goal of Therapy:  Heparin  level 0.3-0.7 units/ml aPTT 66-102 seconds Monitor platelets by anticoagulation protocol: Yes   Plan:  Heparin  level therapeutic x 3 Continue heparin  infusion at 1450 units/hr Recheck HL daily w/ AM labs while therapeutic Daily CBC while on heparin   Rankin CANDIE Dills, PharmD, [facility] 11/30/2023 6:29 AM

## 2023-11-30 NOTE — Discharge Instructions (Signed)

## 2023-11-30 NOTE — Progress Notes (Signed)
 Report given to Elouise, RN at [facility], patient going to room 7702. Transport called and set up.

## 2023-12-01 DIAGNOSIS — E119 Type 2 diabetes mellitus without complications: Secondary | ICD-10-CM | POA: Diagnosis not present

## 2023-12-01 DIAGNOSIS — J449 Chronic obstructive pulmonary disease, unspecified: Secondary | ICD-10-CM | POA: Diagnosis not present

## 2023-12-01 DIAGNOSIS — I214 Non-ST elevation (NSTEMI) myocardial infarction: Secondary | ICD-10-CM | POA: Diagnosis not present

## 2023-12-02 DIAGNOSIS — I110 Hypertensive heart disease with heart failure: Secondary | ICD-10-CM | POA: Diagnosis not present

## 2023-12-02 DIAGNOSIS — E119 Type 2 diabetes mellitus without complications: Secondary | ICD-10-CM | POA: Diagnosis not present

## 2023-12-02 DIAGNOSIS — Z7901 Long term (current) use of anticoagulants: Secondary | ICD-10-CM | POA: Diagnosis not present

## 2023-12-02 DIAGNOSIS — Z86711 Personal history of pulmonary embolism: Secondary | ICD-10-CM | POA: Diagnosis not present

## 2023-12-02 DIAGNOSIS — J449 Chronic obstructive pulmonary disease, unspecified: Secondary | ICD-10-CM | POA: Diagnosis not present

## 2023-12-02 DIAGNOSIS — I509 Heart failure, unspecified: Secondary | ICD-10-CM | POA: Diagnosis not present

## 2023-12-02 DIAGNOSIS — Z955 Presence of coronary angioplasty implant and graft: Secondary | ICD-10-CM | POA: Diagnosis not present

## 2023-12-02 DIAGNOSIS — G4733 Obstructive sleep apnea (adult) (pediatric): Secondary | ICD-10-CM | POA: Diagnosis not present

## 2023-12-02 DIAGNOSIS — Z9989 Dependence on other enabling machines and devices: Secondary | ICD-10-CM | POA: Diagnosis not present

## 2023-12-03 DIAGNOSIS — I214 Non-ST elevation (NSTEMI) myocardial infarction: Secondary | ICD-10-CM | POA: Diagnosis not present

## 2023-12-03 DIAGNOSIS — I82509 Chronic embolism and thrombosis of unspecified deep veins of unspecified lower extremity: Secondary | ICD-10-CM | POA: Diagnosis not present

## 2023-12-03 DIAGNOSIS — J449 Chronic obstructive pulmonary disease, unspecified: Secondary | ICD-10-CM | POA: Diagnosis not present

## 2023-12-03 DIAGNOSIS — I5020 Unspecified systolic (congestive) heart failure: Secondary | ICD-10-CM | POA: Diagnosis not present

## 2023-12-03 DIAGNOSIS — E782 Mixed hyperlipidemia: Secondary | ICD-10-CM | POA: Diagnosis not present

## 2023-12-05 ENCOUNTER — Telehealth: Payer: Self-pay

## 2023-12-05 ENCOUNTER — Ambulatory Visit
Admission: RE | Admit: 2023-12-05 | Discharge: 2023-12-05 | Disposition: A | Discharge status: Home / Self Care | Source: Ambulatory Visit | Attending: Specialist | Admitting: Specialist

## 2023-12-05 ENCOUNTER — Encounter: Payer: Self-pay | Admitting: Internal Medicine

## 2023-12-05 DIAGNOSIS — J449 Chronic obstructive pulmonary disease, unspecified: Secondary | ICD-10-CM | POA: Diagnosis not present

## 2023-12-05 DIAGNOSIS — F17210 Nicotine dependence, cigarettes, uncomplicated: Secondary | ICD-10-CM | POA: Diagnosis not present

## 2023-12-05 LAB — CARDIAC CATHETERIZATION: Cath EF Quantitative: 40 %

## 2023-12-05 NOTE — Transitions of Care (Post Inpatient/ED Visit) (Signed)
 12/05/2023  Name: Ronald Malone MRN: [id number] DOB: 1955-11-08  Today's TOC FU Call Status: Today's TOC FU Call Status:: Successful TOC FU Call Completed TOC FU Call Complete Date: 12/05/23 Patient's Name and Date of Birth confirmed.  Transition Care Management Follow-up Telephone Call Date of Discharge: 11/30/23 Discharge Facility: [facility]) Type of Discharge: Inpatient Admission Primary Inpatient Discharge Diagnosis:: NSTEMI How have you been since you were released from the hospital?: Same Any questions or concerns?: Yes (Patient has a radiology appointment today and asked if I thought he should go since he had this appointment before he went to [facility].  Encouraged patient to contact radiology about his concerns this morning.  He verbalized understanding and stated he would) Patient Questions/Concerns:: see note about Radiology appointment today Patient Questions/Concerns Addressed: Other: (advise only)  Items Reviewed: Did you receive and understand the discharge instructions provided?: Yes Medications obtained,verified, and reconciled?: Yes (Medications Reviewed) Any new allergies since your discharge?: No Dietary orders reviewed?: No (Heart Healthy) Do you have support at home?: Yes People in Home [RPT]: spouse Name of Support/Comfort Primary Source: I have plenty of support   - Arland, spouse  Medications Reviewed Today: Medications Reviewed Today     Reviewed by Eilleen Richerd GRADE, RN (Registered Nurse) on 12/05/23 at 1039  Med List Status: <None>   Medication Order Taking? Sig Documenting Provider Last Dose Status Informant  apixaban  (ELIQUIS ) 2.5 MG TABS tablet [id number]  Take 1 tablet (2.5 mg total) by mouth 2 (two) times daily. Gasper Nancyann BRAVO, MD  Active Spouse/Significant Other  aspirin  EC 81 MG tablet [id number]  Take 1 tablet (81 mg total) by mouth daily. Swallow whole. Tobie Calix, MD  Active   atorvastatin  (LIPITOR) 40 MG tablet  [id number]  Take 1 tablet (40 mg total) by mouth daily. Patel, Sona, MD  Active   azelastine (ASTELIN) 0.1 % nasal spray [id number]  Place into both nostrils 2 (two) times daily. Use in each nostril as directed [provider]  Active Spouse/Significant Other  Blood Glucose Monitoring Suppl (ACCU-CHEK GUIDE) w/Device KIT [id number]  Use to check blood sugar daily for type 2 diabetes E11.9 Gasper Nancyann BRAVO, MD  Active Spouse/Significant Other  COMBIVENT RESPIMAT 20-100 MCG/ACT AERS respimat [id number]  Inhale 1 puff into the lungs every 6 (six) hours as needed. [provider]  Active Spouse/Significant Other           Med Note HUMBERTO, JHONNIE RAMAN   Tue Apr 26, 2023 10:20 AM)    dapagliflozin  propanediol (FARXIGA ) 10 MG TABS tablet [id number]  Take 10 mg by mouth daily. [provider]  Active Spouse/Significant Other           Med Note CATHY OVAL DEL   Tue Nov 29, 2023 10:18 AM) Medication not started PTA  furosemide  (LASIX ) 40 MG tablet [id number]  Take 1 tablet (40 mg total) by mouth daily. Patel, Sona, MD  Active   glucose blood (ACCU-CHEK GUIDE TEST) test strip [id number]  Use as instructed to check blood sugar daily for type 2 diabetes E11.9 Gasper Nancyann BRAVO, MD  Active Spouse/Significant Other  heparin  25000 UT/250ML infusion [id number]  Inject 1,450 Units/hr into the vein continuous. Tobie Calix, MD  Active   losartan  (COZAAR ) 100 MG tablet [id number]  Take 100 mg by mouth daily. [provider]  Active Spouse/Significant Other  metFORMIN  (GLUCOPHAGE -XR) 500 MG 24 hr tablet [id number]  TAKE 2 TABLETS BY MOUTH TWICE A DAY Fisher, Nancyann BRAVO, MD  Active Spouse/Significant Other  metoprolol  succinate (TOPROL -XL) 50 MG 24 hr tablet [id number]  Take 1 tablet by mouth daily. [provider]  Expired 11/29/23 2359 Spouse/Significant Other  nitroGLYCERIN  (NITROSTAT ) 0.4 MG SL tablet [id number]  Place 1 tablet (0.4 mg total) under the tongue every 5 (five) minutes as  needed for chest pain. Gasper Nancyann BRAVO, MD  Active Spouse/Significant Other  polyethylene glycol powder (GLYCOLAX /MIRALAX ) 17 GM/SCOOP powder [id number]  Take 17 g by mouth daily as needed for mild constipation. Dissolve 1 capful (17g) in 4-8 ounces of liquid and take by mouth daily. Patel, Sona, MD  Active   Semaglutide ,0.25 or 0.5MG /DOS, (OZEMPIC , 0.25 OR 0.5 MG/DOSE,) 2 MG/3ML NELMA [id number]  Start 0.25mg  once a week for 4 weeks, then increase to 0.5mg  once a week Gasper Nancyann BRAVO, MD  Active Spouse/Significant Other  spironolactone  (ALDACTONE ) 25 MG tablet [id number]  Take 25 mg by mouth daily. [provider]  Active Spouse/Significant Other           Med Note CATHY OVAL DEL   Tue Nov 29, 2023 10:18 AM) Medication not started PTA  ticagrelor  (BRILINTA ) 90 MG TABS tablet [id number]  Take 1 tablet (90 mg total) by mouth 2 (two) times daily. Tobie Calix, MD  Active           *Patient to check with cardiology was not started on Brilinta , not on medication list from [facility] and Equipment/Supplies: Were Home Health Services Ordered?: No  Functional Questionnaire: Do you need assistance with bathing/showering or dressing?: No Do you need assistance with meal preparation?: No Do you have difficulty maintaining continence: No Do you need assistance with getting out of bed/getting out of a chair/moving?: No Do you have difficulty managing or taking your medications?: No  Follow up appointments reviewed: PCP Follow-up appointment confirmed?: No (Patient declines PCP follow up) Specialist Hospital Follow-up appointment confirmed?: Yes Date of Specialist follow-up appointment?: 12/14/23 Follow-Up Specialty Provider:: Cardiology - Maryl Do you need transportation to your follow-up appointment?: No Do you understand care options if your condition(s) worsen?: Yes-patient verbalized understanding   Richerd Fish, RN, BSN, CCM [place]  [facility],  [facility] RN Care Manager Direct Dial: [phone]

## 2023-12-06 ENCOUNTER — Ambulatory Visit: Payer: Self-pay | Admitting: Family Medicine

## 2023-12-08 ENCOUNTER — Other Ambulatory Visit: Payer: Self-pay | Admitting: Acute Care

## 2023-12-08 DIAGNOSIS — Z87891 Personal history of nicotine dependence: Secondary | ICD-10-CM

## 2023-12-08 DIAGNOSIS — Z122 Encounter for screening for malignant neoplasm of respiratory organs: Secondary | ICD-10-CM

## 2023-12-08 DIAGNOSIS — F17210 Nicotine dependence, cigarettes, uncomplicated: Secondary | ICD-10-CM

## 2023-12-14 DIAGNOSIS — E119 Type 2 diabetes mellitus without complications: Secondary | ICD-10-CM | POA: Diagnosis not present

## 2023-12-14 DIAGNOSIS — Z86718 Personal history of other venous thrombosis and embolism: Secondary | ICD-10-CM | POA: Diagnosis not present

## 2023-12-14 DIAGNOSIS — R5383 Other fatigue: Secondary | ICD-10-CM | POA: Diagnosis not present

## 2023-12-14 DIAGNOSIS — I5021 Acute systolic (congestive) heart failure: Secondary | ICD-10-CM | POA: Diagnosis not present

## 2023-12-14 DIAGNOSIS — G4733 Obstructive sleep apnea (adult) (pediatric): Secondary | ICD-10-CM | POA: Diagnosis not present

## 2023-12-14 DIAGNOSIS — I25118 Atherosclerotic heart disease of native coronary artery with other forms of angina pectoris: Secondary | ICD-10-CM | POA: Diagnosis not present

## 2023-12-14 DIAGNOSIS — R0602 Shortness of breath: Secondary | ICD-10-CM | POA: Diagnosis not present

## 2023-12-14 DIAGNOSIS — I739 Peripheral vascular disease, unspecified: Secondary | ICD-10-CM | POA: Diagnosis not present

## 2023-12-14 DIAGNOSIS — E782 Mixed hyperlipidemia: Secondary | ICD-10-CM | POA: Diagnosis not present

## 2023-12-14 DIAGNOSIS — I219 Acute myocardial infarction, unspecified: Secondary | ICD-10-CM | POA: Diagnosis not present

## 2023-12-14 DIAGNOSIS — I214 Non-ST elevation (NSTEMI) myocardial infarction: Secondary | ICD-10-CM | POA: Diagnosis not present

## 2023-12-20 DIAGNOSIS — G4733 Obstructive sleep apnea (adult) (pediatric): Secondary | ICD-10-CM | POA: Diagnosis not present

## 2023-12-20 DIAGNOSIS — F17210 Nicotine dependence, cigarettes, uncomplicated: Secondary | ICD-10-CM | POA: Diagnosis not present

## 2023-12-20 DIAGNOSIS — R0609 Other forms of dyspnea: Secondary | ICD-10-CM | POA: Diagnosis not present

## 2023-12-20 DIAGNOSIS — I288 Other diseases of pulmonary vessels: Secondary | ICD-10-CM | POA: Diagnosis not present

## 2023-12-20 DIAGNOSIS — J301 Allergic rhinitis due to pollen: Secondary | ICD-10-CM | POA: Diagnosis not present

## 2024-01-16 ENCOUNTER — Encounter: Payer: Self-pay | Admitting: Podiatry

## 2024-01-16 ENCOUNTER — Ambulatory Visit: Admitting: Podiatry

## 2024-01-16 DIAGNOSIS — S91101A Unspecified open wound of right great toe without damage to nail, initial encounter: Secondary | ICD-10-CM

## 2024-01-16 DIAGNOSIS — S90414A Abrasion, right lesser toe(s), initial encounter: Secondary | ICD-10-CM | POA: Diagnosis not present

## 2024-01-16 DIAGNOSIS — M79674 Pain in right toe(s): Secondary | ICD-10-CM | POA: Diagnosis not present

## 2024-01-16 DIAGNOSIS — E1165 Type 2 diabetes mellitus with hyperglycemia: Secondary | ICD-10-CM | POA: Diagnosis not present

## 2024-01-16 DIAGNOSIS — B351 Tinea unguium: Secondary | ICD-10-CM

## 2024-01-16 DIAGNOSIS — L02611 Cutaneous abscess of right foot: Secondary | ICD-10-CM | POA: Diagnosis not present

## 2024-01-16 DIAGNOSIS — L03031 Cellulitis of right toe: Secondary | ICD-10-CM | POA: Diagnosis not present

## 2024-01-16 DIAGNOSIS — M79675 Pain in left toe(s): Secondary | ICD-10-CM

## 2024-01-16 MED ORDER — DOXYCYCLINE HYCLATE 100 MG PO CAPS: 100.0000 mg | ORAL_CAPSULE | Freq: Two times a day (BID) | ORAL | 0 refills | Status: AC

## 2024-01-22 NOTE — Progress Notes (Signed)
  Subjective:  Patient ID: Ronald Malone, male    DOB: [birth year],  MRN: [id number]  COLONEL KRAUSER presents to clinic today for at risk foot care with history of diabetic neuropathy and painful mycotic toenails x 10 which interfere with daily activities. Pain is relieved with periodic professional debridement. Patient has abrasions on his right great toe and right 2nd toe. When asked about injury, he cannot recall any episode of trauma. He does have peripheral neuropathy. Chief Complaint  Patient presents with   Toe Pain    Dr. Gasper is his PCP. Last visit was in August. A1c 7.2    PCP is Gasper Nancyann BRAVO, MD.  No Known Allergies  Review of Systems: Negative except as noted in the HPI.  Objective:  There were no vitals filed for this visit. Ronald Malone is a pleasant 68 y.o. male morbidly obese in NAD. AAO x 3.   Vascular Examination: Vascular status intact b/l with palpable pedal pulses. Pedal hair diminished b/l.  CFT immediate b/l. No edema. No pain with calf compression b/l. Skin temperature gradient WNL b/l. Nonpitting edema noted BLE.  Neurological Examination: Pt has subjective symptoms of neuropathy. Sensation grossly intact b/l with 10 gram monofilament.   Dermatological Examination: Multiple abrasions right great toe and right 2nd digit. Mild erythema to digits  Toenails 1-5 b/l thick, discolored, elongated with subungual debris and pain on dorsal palpation.  Musculoskeletal Examination: Muscle strength 5/5 to b/l LE. Muscle strength 5/5 to all lower extremity muscle groups bilaterally. No pain, crepitus or joint limitation noted with ROM bilateral LE. Pes planus deformity noted bilateral LE.  Radiographs: None  Assessment/Plan: 1. Pain due to onychomycosis of toenails of both feet   2. Cellulitis and abscess of toe of right foot   3. Abrasion of multiple toes of right foot   4. Type 2 diabetes mellitus with hyperglycemia, without long-term current use of  insulin  (HCC)     Meds ordered this encounter  Medications   doxycycline  (VIBRAMYCIN ) 100 MG capsule    Sig: Take 1 capsule (100 mg total) by mouth 2 (two) times daily for 10 days.    Dispense:  20 capsule    Refill:  0   -Patient was evaluated today. All questions/concerns addressed on today's visit. -Continue foot and shoe inspections daily. Monitor blood glucose per PCP/Endocrinologist's recommendations. -Patient to continue soft, supportive shoe gear daily. -Mycotic toenails 1-5 bilaterally were debrided in length and girth with sterile nail nippers and dremel without incident. -Rx for Doxycyline 100 mg, #20, to be taken one capsule twice daily for 10 days. -Patient/POA to call should there be question/concern in the interim.   Return in about 3 months (around 04/17/2024).  Delon LITTIE Merlin, DPM      [place] LOCATION: [address] [place] [address]                   Office [phone]   [name] LOCATION: [address] [place], [place] [address] Office [phone]

## 2024-02-26 ENCOUNTER — Other Ambulatory Visit: Payer: Self-pay | Admitting: Family Medicine

## 2024-02-26 DIAGNOSIS — I25118 Atherosclerotic heart disease of native coronary artery with other forms of angina pectoris: Secondary | ICD-10-CM

## 2024-02-27 ENCOUNTER — Ambulatory Visit: Admitting: Family Medicine

## 2024-02-27 ENCOUNTER — Encounter: Payer: Self-pay | Admitting: Family Medicine

## 2024-02-27 VITALS — BP 143/78 | HR 64 | Ht 72.0 in | Wt 309.3 lb

## 2024-02-27 DIAGNOSIS — I25118 Atherosclerotic heart disease of native coronary artery with other forms of angina pectoris: Secondary | ICD-10-CM

## 2024-02-27 DIAGNOSIS — I5020 Unspecified systolic (congestive) heart failure: Secondary | ICD-10-CM | POA: Diagnosis not present

## 2024-02-27 DIAGNOSIS — E785 Hyperlipidemia, unspecified: Secondary | ICD-10-CM

## 2024-02-27 DIAGNOSIS — Z7984 Long term (current) use of oral hypoglycemic drugs: Secondary | ICD-10-CM | POA: Diagnosis not present

## 2024-02-27 DIAGNOSIS — E1169 Type 2 diabetes mellitus with other specified complication: Secondary | ICD-10-CM

## 2024-02-27 DIAGNOSIS — Z7985 Long-term (current) use of injectable non-insulin antidiabetic drugs: Secondary | ICD-10-CM

## 2024-02-27 LAB — POCT GLYCOSYLATED HEMOGLOBIN (HGB A1C)
Est. average glucose Bld gHb Est-mCnc: 140
Hemoglobin A1C: 6.5 % — AB (ref 4.0–5.6)

## 2024-02-27 NOTE — Progress Notes (Signed)
 Established patient visit   Patient: Ronald Malone   DOB: 1955/08/05   68 y.o. Male  MRN: [id number] Visit Date: 02/27/2024  Today's healthcare provider: Nancyann Perry, MD   Chief Complaint  Patient presents with   Medical Management of Chronic Issues    Follow up for diabetes    Subjective    Discussed the use of AI scribe software for clinical note transcription with the patient, who gave verbal consent to proceed.  History of Present Illness   Joeseph Verville is a 68 year old male with coronary artery disease and diabetes who presents for a follow-up on his blood sugar levels.  He has a history of coronary artery disease and experienced a myocardial infarction a couple of months ago, leading to the placement of three new stents. He is under the care of a cardiologist and is currently taking clopidogrel, Eliquis , metoprolol , atorvastatin , and spironolactone . The patient thinks metoprolol  may have been increased from 50 mg to 100 mg, but is not certain. Atorvastatin  was increased from 20 mg to 80 mg. He is not taking aspirin . A previous myocardial infarction 10 years ago required a stent placement that was recently extended.  His diabetes is managed with metformin  and Ozempic . He started Ozempic  with 0.25 mg for the first four doses and then 0.5 mg for the last two doses in each box. He has three and a half months' supply of Ozempic  left and plans to switch to Mounjaro after that. He experiences occasional nausea after eating, which he associates with Ozempic  use, but this has not been a recent issue. His A1c is currently 6.5.  He is also taking Farxiga  and plans to switch to Jardiance due to potential cost savings. He is concerned about the rising cost of his medications, particularly Eliquis , which will increase significantly with his insurance changes in the new year. He is exploring options to manage these costs.  He has a history of smoking. He monitors his blood  pressure and blood sugar levels daily and maintains a spreadsheet of these readings. He does not take Lasix , as he did not appreciate its effects when administered in the hospital.     Lab Results  Component Value Date   HGBA1C 6.5 (A) 02/27/2024   HGBA1C 7.3 (H) 10/31/2023   HGBA1C 8.7 (A) 05/10/2023   Lab Results  Component Value Date   NA 137 11/30/2023   CL 104 11/30/2023   K 4.2 11/30/2023   CO2 25 11/30/2023   BUN 16 11/30/2023   CREATININE 0.94 11/30/2023   GFRNONAA >60 11/30/2023   CALCIUM  8.7 (L) 11/30/2023   ALBUMIN 3.4 (L) 11/30/2023   GLUCOSE 129 (H) 11/30/2023   Lab Results  Component Value Date   CHOL 124 10/31/2023   HDL 33 (L) 10/31/2023   LDLCALC 63 10/31/2023   LDLDIRECT 71 10/18/2018   TRIG 162 (H) 10/31/2023   CHOLHDL 3.8 10/31/2023   Wt Readings from Last 3 Encounters:  02/27/24 (!) 309 lb 4.8 oz (140.3 kg)  12/05/23 (!) 303 lb 3.2 oz (137.5 kg)  11/30/23 299 lb 2.6 oz (135.7 kg)     Medications: Outpatient Medications Prior to Visit  Medication Sig   apixaban  (ELIQUIS ) 2.5 MG TABS tablet Take 1 tablet (2.5 mg total) by mouth 2 (two) times daily.   atorvastatin  (LIPITOR) 80 MG tablet Take 80 mg by mouth daily.   clopidogrel (PLAVIX) 75 MG tablet Take 75 mg by mouth daily.  empagliflozin (JARDIANCE) 10 MG TABS tablet Take 10 mg by mouth daily. (Has note start taking yet}   losartan  (COZAAR ) 100 MG tablet Take 100 mg by mouth daily.   metFORMIN  (GLUCOPHAGE -XR) 500 MG 24 hr tablet TAKE 2 TABLETS BY MOUTH TWICE A DAY   Semaglutide ,0.25 or 0.5MG /DOS, (OZEMPIC , 0.25 OR 0.5 MG/DOSE,) 2 MG/3ML SOPN Start 0.25mg  once a week for 4 weeks, then increase to 0.5mg  once a week (Patient taking differently: Inject 0.5 mg as directed once a week.)   spironolactone  (ALDACTONE ) 25 MG tablet Take 25 mg by mouth daily.   azelastine (ASTELIN) 0.1 % nasal spray Place into both nostrils 2 (two) times daily. Use in each nostril as directed   Blood Glucose Monitoring Suppl  (ACCU-CHEK GUIDE) w/Device KIT Use to check blood sugar daily for type 2 diabetes E11.9   COMBIVENT RESPIMAT 20-100 MCG/ACT AERS respimat Inhale 1 puff into the lungs every 6 (six) hours as needed.   dapagliflozin  propanediol (FARXIGA ) 10 MG TABS tablet Take 10 mg by mouth daily.   glucose blood (ACCU-CHEK GUIDE TEST) test strip Use as instructed to check blood sugar daily for type 2 diabetes E11.9   metoprolol  succinate (TOPROL -XL) 50 MG 24 hr tablet Take 1 tablet by mouth daily.   nitroGLYCERIN  (NITROSTAT ) 0.4 MG SL tablet PLACE 1 TABLET UNDER THE TONGUE EVERY 5 MINUTES AS NEEDED FOR CHEST PAIN.   polyethylene glycol powder (GLYCOLAX /MIRALAX ) 17 GM/SCOOP powder Take 17 g by mouth daily as needed for mild constipation. Dissolve 1 capful (17g) in 4-8 ounces of liquid and take by mouth daily.   No facility-administered medications prior to visit.        Objective    BP (!) 143/78 (BP Location: Left Arm, Patient Position: Sitting, Cuff Size: Large)   Pulse 64   Ht 6' (1.829 m)   Wt (!) 309 lb 4.8 oz (140.3 kg)   SpO2 98%   BMI 41.95 kg/m   Physical Exam   General appearance: Severely obese male, cooperative and in no acute distress Head: Normocephalic, without obvious abnormality, atraumatic Respiratory: Respirations even and unlabored, normal respiratory rate Extremities: All extremities are intact.  Skin: Skin color, texture, turgor normal. No rashes seen  Psych: Appropriate mood and affect. Neurologic: Mental status: Alert, oriented to person, place, and time, thought content appropriate.   Results for orders placed or performed in visit on 02/27/24  POCT glycosylated hemoglobin (Hb A1C)  Result Value Ref Range   Hemoglobin A1C 6.5 (A) 4.0 - 5.6 %   Est. average glucose Bld gHb Est-mCnc 140     Assessment & Plan        Type 2 diabetes mellitus associated with coronary artery disease and heart failure.  A1c controlled at 6.5%. Plans to switch to Mounjaro for better weight  loss and tolerance. Farxiga  to be switched to Jardiance due to cost. - Continue metformin . - Use remaining Ozempic  until March, then switch to Mounjaro. - Switch from Farxiga  to Jardiance before year-end.  Recent NSTEMI with coronary stent placement Recent NSTEMI with three stents placed. On Plavix and Eliquis . No aspirin  due to concurrent use of Plavix and Eliquis . Cardiologist follow-up in February for medication and stent care. - Continue Plavix and Eliquis . - Follow up with cardiologist at [facility] in February.  Heart failure EF =30-35% on echo after recent NSTEM. Managed with losartan  and metoprolol  with recent addition of spironolactone  by cardiology. No Lasix  due to preference. Recent heart attack and stent placement influenced regimen. - Continue  losartan  and metoprolol .  Hyperlipidemia Managed with atorvastatin , increased to 80 mg by cardiologist. - Continue atorvastatin  80 mg.     Return in about 5 months (around 07/27/2024).      Nancyann Perry, MD  [facility] [phone] (phone) [phone] (fax)  [facility]

## 2024-02-27 NOTE — Patient Instructions (Signed)
 SABRA  Please review the attached list of medications and notify my office if there are any errors.   . Please bring all of your medications to every appointment so we can make sure that our medication list is the same as yours.

## 2024-03-12 ENCOUNTER — Other Ambulatory Visit: Payer: Self-pay | Admitting: Podiatry

## 2024-03-12 DIAGNOSIS — L02611 Cutaneous abscess of right foot: Secondary | ICD-10-CM

## 2024-03-12 DIAGNOSIS — S90414A Abrasion, right lesser toe(s), initial encounter: Secondary | ICD-10-CM

## 2024-04-04 LAB — OPHTHALMOLOGY REPORT-SCANNED

## 2024-04-11 ENCOUNTER — Encounter: Payer: Self-pay | Admitting: *Deleted

## 2024-04-11 ENCOUNTER — Other Ambulatory Visit: Payer: Self-pay

## 2024-04-11 ENCOUNTER — Emergency Department
Admission: EM | Admit: 2024-04-11 | Discharge: 2024-04-11 | Disposition: A | Discharge status: Home / Self Care | Attending: Emergency Medicine | Admitting: Emergency Medicine

## 2024-04-11 ENCOUNTER — Emergency Department

## 2024-04-11 DIAGNOSIS — W000XXA Fall on same level due to ice and snow, initial encounter: Secondary | ICD-10-CM | POA: Diagnosis not present

## 2024-04-11 DIAGNOSIS — I509 Heart failure, unspecified: Secondary | ICD-10-CM | POA: Insufficient documentation

## 2024-04-11 DIAGNOSIS — I2510 Atherosclerotic heart disease of native coronary artery without angina pectoris: Secondary | ICD-10-CM | POA: Diagnosis not present

## 2024-04-11 DIAGNOSIS — E119 Type 2 diabetes mellitus without complications: Secondary | ICD-10-CM | POA: Insufficient documentation

## 2024-04-11 DIAGNOSIS — J449 Chronic obstructive pulmonary disease, unspecified: Secondary | ICD-10-CM | POA: Diagnosis not present

## 2024-04-11 DIAGNOSIS — W19XXXA Unspecified fall, initial encounter: Secondary | ICD-10-CM

## 2024-04-11 DIAGNOSIS — S2242XA Multiple fractures of ribs, left side, initial encounter for closed fracture: Secondary | ICD-10-CM | POA: Diagnosis present

## 2024-04-11 MED ORDER — OXYCODONE-ACETAMINOPHEN 5-325 MG PO TABS
1.0000 | ORAL_TABLET | Freq: Once | ORAL | Status: AC
  Administered 2024-04-11: 1 via ORAL
  Filled 2024-04-11: qty 1

## 2024-04-11 MED ORDER — OXYCODONE-ACETAMINOPHEN 5-325 MG PO TABS: 1.0000 | ORAL_TABLET | Freq: Three times a day (TID) | ORAL | 0 refills | Status: AC | PRN

## 2024-04-11 NOTE — ED Provider Notes (Signed)
 "  [facility] Provider Note    Event Date/Time   First MD Initiated Contact with Patient 04/11/24 2025     (approximate)   History   Fall   HPI  Ronald Malone is a 69 y.o. male with a history of diabetes, CHF, COPD, CAD who presents after a fall.  Patient slipped on ice and fell onto his left chest.  He is on blood thinners but denies head injury.     Physical Exam   Triage Vital Signs: ED Triage Vitals  Encounter Vitals Group     BP 04/11/24 2059 (!) 144/78     Girls Systolic BP Percentile --      Girls Diastolic BP Percentile --      Boys Systolic BP Percentile --      Boys Diastolic BP Percentile --      Pulse Rate 04/11/24 1814 68     Resp 04/11/24 1814 18     Temp 04/11/24 1814 (!) 97.5 F (36.4 C)     Temp Source 04/11/24 1814 Oral     SpO2 04/11/24 1814 98 %     Weight 04/11/24 1815 133.8 kg (295 lb)     Height 04/11/24 1815 1.829 m (6')     Head Circumference --      Peak Flow --      Pain Score 04/11/24 1815 10     Pain Loc --      Pain Education --      Exclude from Growth Chart --     Most recent vital signs: Vitals:   04/11/24 1814 04/11/24 2059  BP:  (!) 144/78  Pulse: 68 78  Resp: 18 18  Temp: (!) 97.5 F (36.4 C) 97.8 F (36.6 C)  SpO2: 98% 98%     General: Awake, no distress.  CV:  Good peripheral perfusion.  Tenderness to the left chest wall Resp:  Normal effort.  Abd:  No distention.  Other:     ED Results / Procedures / Treatments   Labs (all labs ordered are listed, but only abnormal results are displayed) Labs Reviewed - No data to display   EKG     RADIOLOGY Chest x-ray is consistent with 2 rib fracture    PROCEDURES:  Critical Care performed:   Procedures   MEDICATIONS ORDERED IN ED: Medications  oxyCODONE -acetaminophen  (PERCOCET/ROXICET) 5-325 MG per tablet 1 tablet (1 tablet Oral Given 04/11/24 2053)     IMPRESSION / MDM / ASSESSMENT AND PLAN / ED COURSE  I reviewed the  triage vital signs and the nursing notes. Patient's presentation is most consistent with acute presentation with potential threat to life or bodily function.  Patient presents with slip and fall as detailed above, injured only his chest, no head injury, he is adamant about this.  Tenderness palpation along the left chest with some mild bruising.  Chest x-ray is consistent with rib fracture, no evidence of pneumothorax  Will start patient on pain medication, incentive spirometer provided  Strict return precautions, outpatient follow-up recommended        FINAL CLINICAL IMPRESSION(S) / ED DIAGNOSES   Final diagnoses:  Fall, initial encounter  Closed fracture of multiple ribs of left side, initial encounter     Rx / DC Orders   ED Discharge Orders          Ordered    oxyCODONE -acetaminophen  (PERCOCET) 5-325 MG tablet  Every 8 hours PRN        04/11/24  2030             Note:  This document was prepared using Dragon voice recognition software and may include unintentional dictation errors.   Arlander Charleston, MD 04/11/24 2150  "

## 2024-04-11 NOTE — ED Triage Notes (Signed)
 Pt ambulatory to triage.  Pt fell on the ice today and has left ant/post rib pain.  No sob.  No neck or back pain   pt alert.

## 2024-04-26 ENCOUNTER — Ambulatory Visit: Admitting: Podiatry

## 2024-05-01 ENCOUNTER — Ambulatory Visit: Payer: Medicare Other

## 2024-05-14 IMAGING — CT CT CHEST LUNG CANCER SCREENING LOW DOSE W/O CM
1 of 2 series · 15 of 33 positions shown, 19 images · non-contrast
Comparison: 08/14/2020

CLINICAL DATA: 58-year-old male with 46 pack-year history of
smoking. Lung cancer screening.



[Series 4: chest 1mm/super d · axial · 0.91mm/px · z∈[-339,-89]mm · 15 of 306 slices shown, 19 images]
[im 14/306  mediastinal]
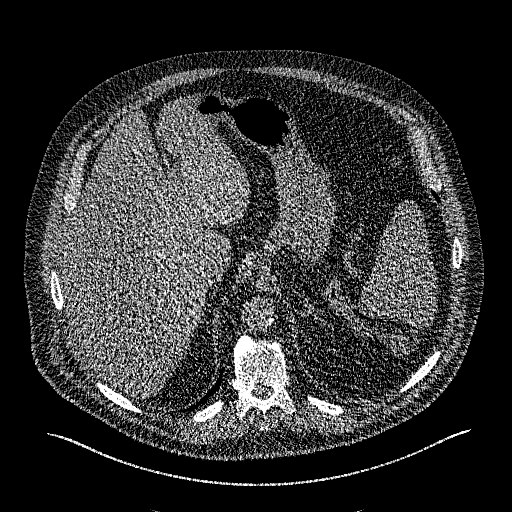
[im 14/306  lung]
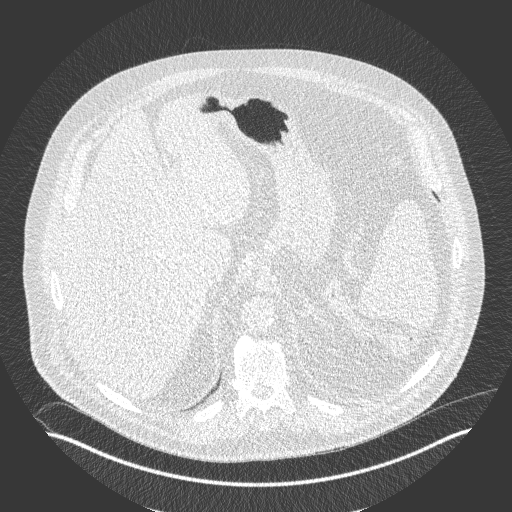
[im 40/306  lung]
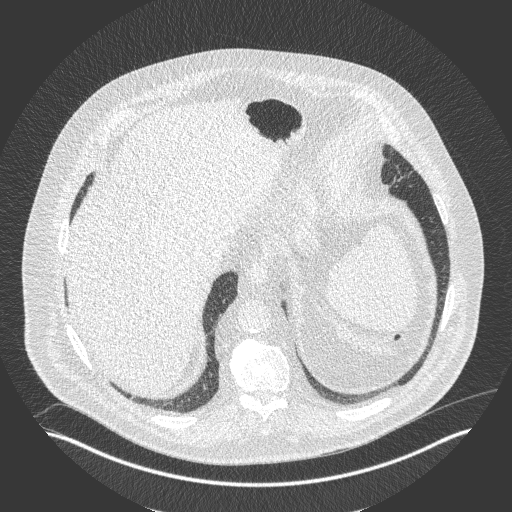
[im 67/306  lung]
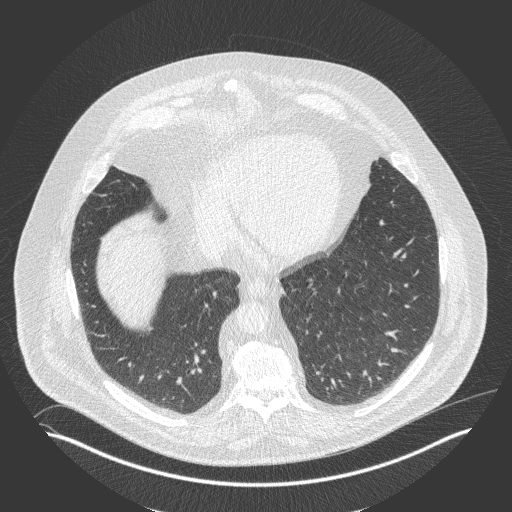
[im 80/306  lung]
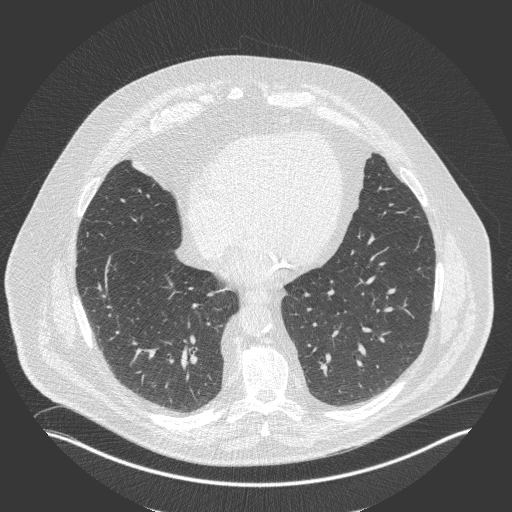
[im 93/306  mediastinal]
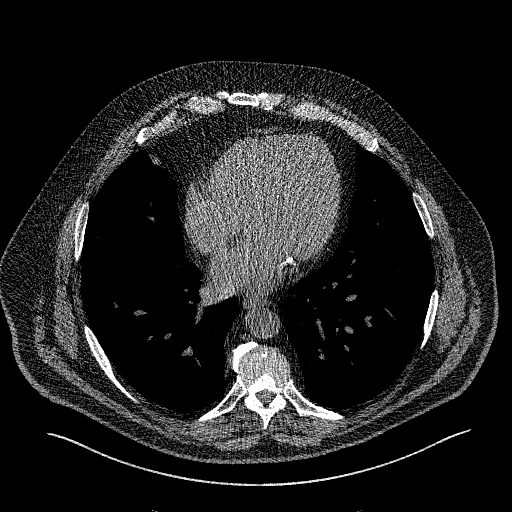
[im 93/306  lung]
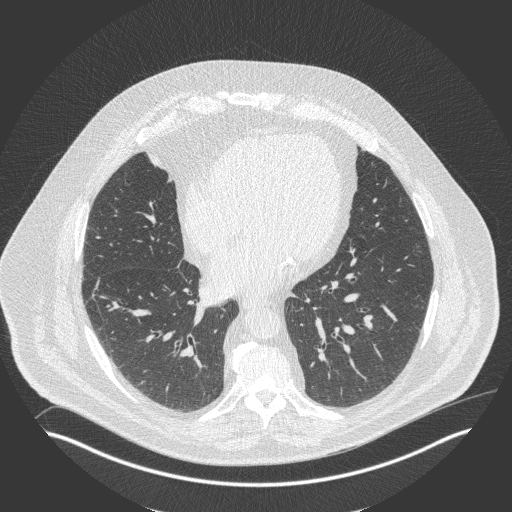
[im 120/306  lung]
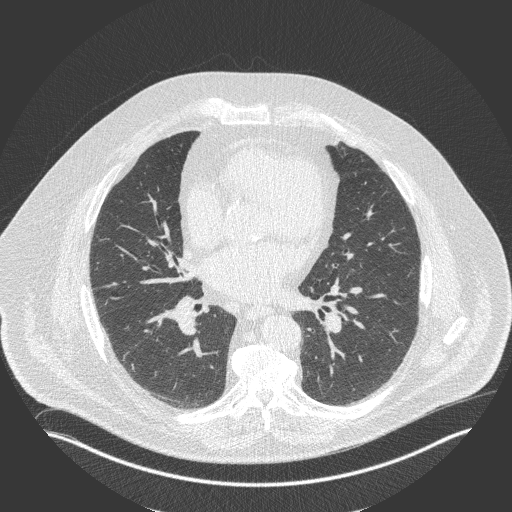
[im 143/306  lung]
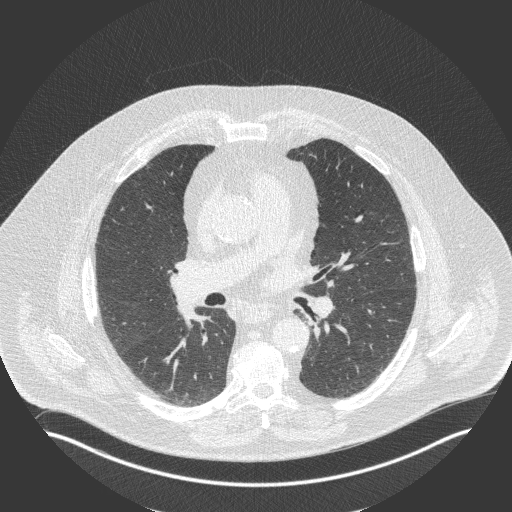
[im 153/306  lung]
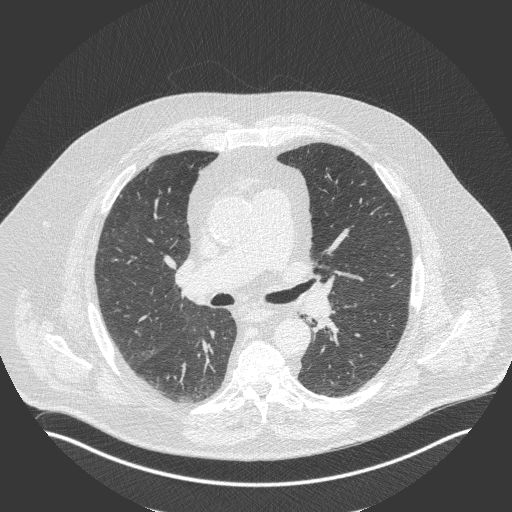
[im 160/306  mediastinal]
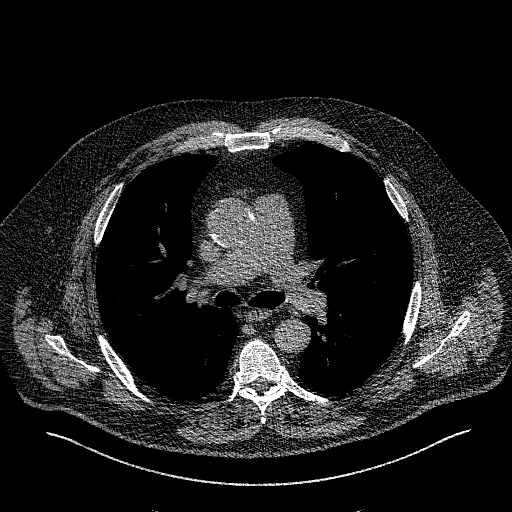
[im 160/306  lung]
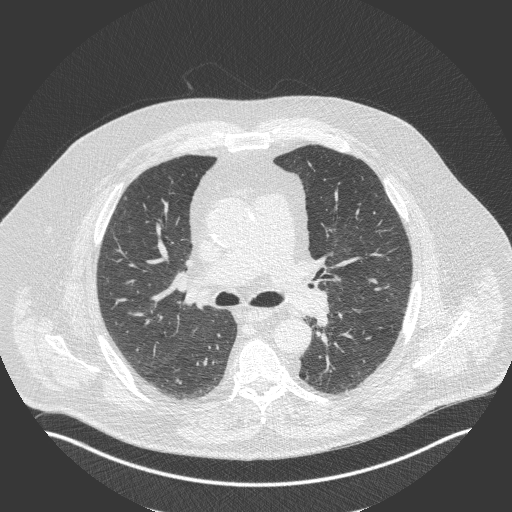
[im 186/306  lung]
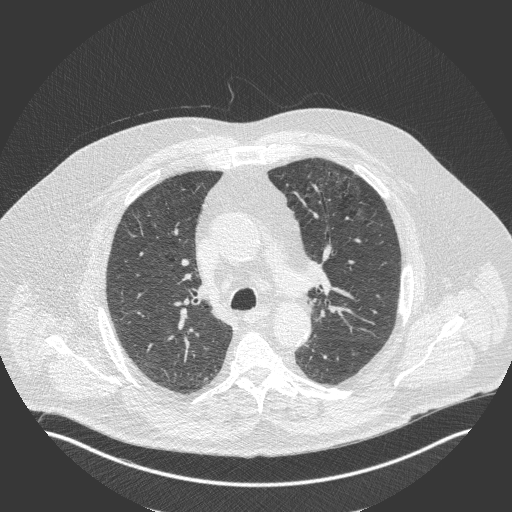
[im 213/306  lung]
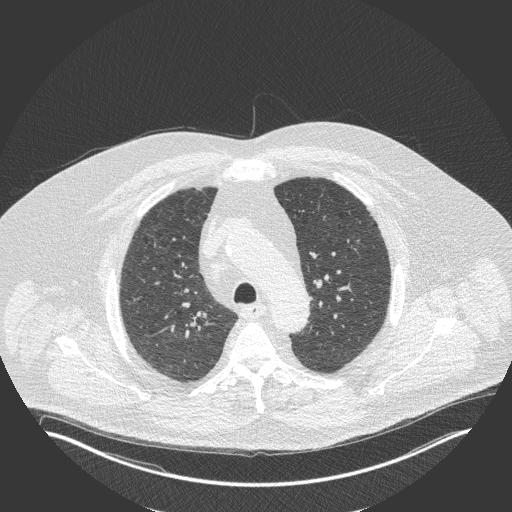
[im 226/306  lung]
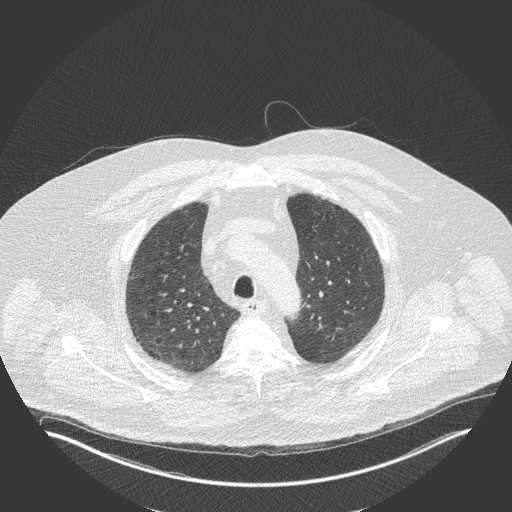
[im 239/306  mediastinal]
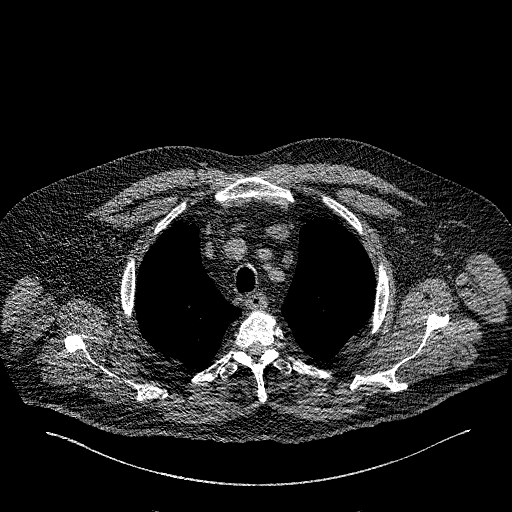
[im 239/306  lung]
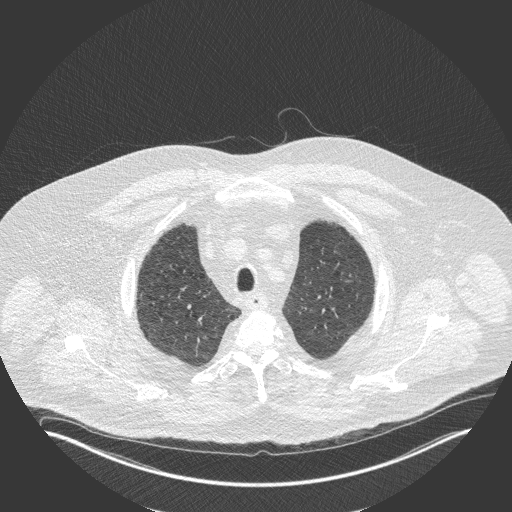
[im 266/306  lung]
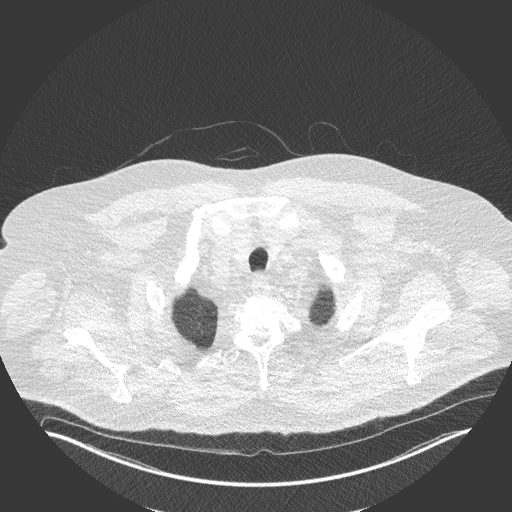
[im 292/306  lung]
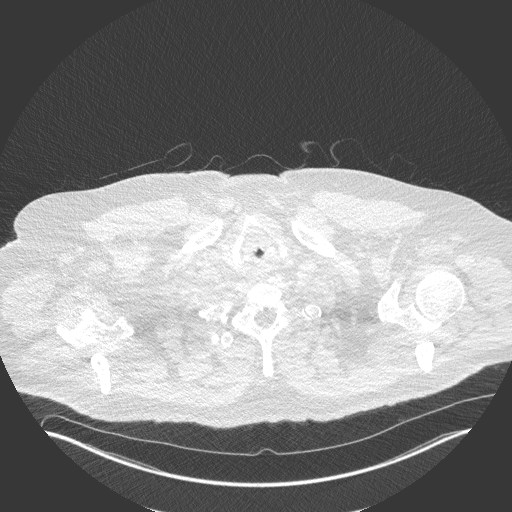

[15 of 33 positions shown; findings below may reference images not displayed]

FINDINGS: Cardiovascular: The heart size is normal. No substantial pericardial
effusion. Coronary artery calcification is evident. Mild
atherosclerotic calcification is noted in the wall of the thoracic
aorta.

Mediastinum/Nodes: No mediastinal lymphadenopathy. Multiple
scattered small mediastinal lymph nodes are stable in the interval.
No evidence for gross hilar lymphadenopathy although assessment is
limited by the lack of intravenous contrast on the current study.
The esophagus has normal imaging features. There is no axillary
lymphadenopathy.

Lungs/Pleura: Centrilobular and paraseptal emphysema evident. Tiny
pulmonary nodules identified previously are stable. No new
suspicious pulmonary nodule or mass. No focal airspace
consolidation. No pleural effusion.

Upper Abdomen: Unremarkable.

Musculoskeletal: No worrisome lytic or sclerotic osseous
abnormality.
IMPRESSION: Lung-RADS 2, benign appearance or behavior. Continue annual
screening with low-dose chest CT without contrast in 12 months.

Emphysema (L7V9T-AKD.I) and Aortic Atherosclerosis (L7V9T-170.0)

## 2024-08-03 ENCOUNTER — Ambulatory Visit: Admitting: Family Medicine

## 2025-10-29 ENCOUNTER — Ambulatory Visit (INDEPENDENT_AMBULATORY_CARE_PROVIDER_SITE_OTHER): Admitting: Vascular Surgery

## 2025-10-29 ENCOUNTER — Encounter (INDEPENDENT_AMBULATORY_CARE_PROVIDER_SITE_OTHER)
